# Patient Record
Sex: Male | Born: 1935 | Race: White | Hispanic: No | Marital: Married | Smoking: Never smoker
Health system: Southern US, Community
[De-identification: ages and names within clinical notes are randomized; demographics above are authoritative.]

## PROBLEM LIST (undated history)

## (undated) DIAGNOSIS — B029 Zoster without complications: Secondary | ICD-10-CM

## (undated) DIAGNOSIS — C61 Malignant neoplasm of prostate: Secondary | ICD-10-CM

## (undated) DIAGNOSIS — C801 Malignant (primary) neoplasm, unspecified: Secondary | ICD-10-CM

## (undated) DIAGNOSIS — I499 Cardiac arrhythmia, unspecified: Secondary | ICD-10-CM

## (undated) DIAGNOSIS — E785 Hyperlipidemia, unspecified: Secondary | ICD-10-CM

## (undated) HISTORY — DX: Malignant neoplasm of prostate: C61

## (undated) HISTORY — PX: APPENDECTOMY: SHX54

## (undated) HISTORY — DX: Cardiac arrhythmia, unspecified: I49.9

## (undated) HISTORY — DX: Hyperlipidemia, unspecified: E78.5

## (undated) HISTORY — DX: Zoster without complications: B02.9

---

## 1999-09-19 ENCOUNTER — Encounter: Payer: Self-pay | Admitting: Emergency Medicine

## 1999-09-20 ENCOUNTER — Encounter: Payer: Self-pay | Admitting: Internal Medicine

## 1999-09-20 ENCOUNTER — Inpatient Hospital Stay (HOSPITAL_COMMUNITY): Admission: EM | Admit: 1999-09-20 | Discharge: 1999-09-25 | Payer: Self-pay | Admitting: Emergency Medicine

## 1999-09-21 ENCOUNTER — Encounter: Payer: Self-pay | Admitting: Internal Medicine

## 1999-09-22 ENCOUNTER — Encounter: Payer: Self-pay | Admitting: Internal Medicine

## 1999-09-23 ENCOUNTER — Encounter: Payer: Self-pay | Admitting: Internal Medicine

## 1999-09-24 ENCOUNTER — Encounter: Payer: Self-pay | Admitting: Internal Medicine

## 1999-09-29 ENCOUNTER — Encounter: Admission: RE | Admit: 1999-09-29 | Discharge: 1999-09-29 | Payer: Self-pay | Admitting: Family Medicine

## 1999-10-04 ENCOUNTER — Encounter: Payer: Self-pay | Admitting: Sports Medicine

## 1999-10-04 ENCOUNTER — Encounter: Admission: RE | Admit: 1999-10-04 | Discharge: 1999-10-04 | Payer: Self-pay | Admitting: Sports Medicine

## 1999-10-25 ENCOUNTER — Encounter: Admission: RE | Admit: 1999-10-25 | Discharge: 1999-10-25 | Payer: Self-pay | Admitting: Sports Medicine

## 1999-12-07 ENCOUNTER — Encounter: Admission: RE | Admit: 1999-12-07 | Discharge: 1999-12-07 | Payer: Self-pay | Admitting: Sports Medicine

## 1999-12-08 ENCOUNTER — Encounter: Payer: Self-pay | Admitting: Sports Medicine

## 1999-12-08 ENCOUNTER — Encounter: Admission: RE | Admit: 1999-12-08 | Discharge: 1999-12-08 | Payer: Self-pay | Admitting: Sports Medicine

## 1999-12-10 ENCOUNTER — Encounter: Admission: RE | Admit: 1999-12-10 | Discharge: 1999-12-10 | Payer: Self-pay | Admitting: Family Medicine

## 1999-12-13 ENCOUNTER — Encounter: Admission: RE | Admit: 1999-12-13 | Discharge: 1999-12-13 | Payer: Self-pay | Admitting: Sports Medicine

## 1999-12-13 ENCOUNTER — Encounter: Payer: Self-pay | Admitting: Sports Medicine

## 1999-12-16 ENCOUNTER — Encounter: Admission: RE | Admit: 1999-12-16 | Discharge: 1999-12-16 | Payer: Self-pay | Admitting: Family Medicine

## 1999-12-27 ENCOUNTER — Encounter: Admission: RE | Admit: 1999-12-27 | Discharge: 1999-12-27 | Payer: Self-pay | Admitting: Family Medicine

## 2001-01-17 ENCOUNTER — Encounter: Admission: RE | Admit: 2001-01-17 | Discharge: 2001-01-17 | Payer: Self-pay | Admitting: Family Medicine

## 2001-01-17 ENCOUNTER — Ambulatory Visit (HOSPITAL_COMMUNITY): Admission: RE | Admit: 2001-01-17 | Discharge: 2001-01-17 | Payer: Self-pay | Admitting: Family Medicine

## 2001-02-13 ENCOUNTER — Encounter: Admission: RE | Admit: 2001-02-13 | Discharge: 2001-02-13 | Payer: Self-pay | Admitting: Family Medicine

## 2001-09-05 ENCOUNTER — Encounter: Admission: RE | Admit: 2001-09-05 | Discharge: 2001-09-05 | Payer: Self-pay | Admitting: Family Medicine

## 2001-10-12 ENCOUNTER — Encounter: Admission: RE | Admit: 2001-10-12 | Discharge: 2001-10-12 | Payer: Self-pay | Admitting: Family Medicine

## 2001-10-22 ENCOUNTER — Encounter: Admission: RE | Admit: 2001-10-22 | Discharge: 2001-10-22 | Payer: Self-pay | Admitting: Family Medicine

## 2002-02-05 ENCOUNTER — Encounter: Admission: RE | Admit: 2002-02-05 | Discharge: 2002-02-05 | Payer: Self-pay | Admitting: Family Medicine

## 2002-11-07 DIAGNOSIS — K5792 Diverticulitis of intestine, part unspecified, without perforation or abscess without bleeding: Secondary | ICD-10-CM

## 2002-11-07 HISTORY — DX: Diverticulitis of intestine, part unspecified, without perforation or abscess without bleeding: K57.92

## 2003-03-17 ENCOUNTER — Ambulatory Visit (HOSPITAL_COMMUNITY): Admission: RE | Admit: 2003-03-17 | Discharge: 2003-03-17 | Payer: Self-pay | Admitting: Urology

## 2003-03-17 ENCOUNTER — Encounter: Payer: Self-pay | Admitting: Urology

## 2003-04-02 ENCOUNTER — Ambulatory Visit: Admission: RE | Admit: 2003-04-02 | Discharge: 2003-05-21 | Payer: Self-pay | Admitting: Radiation Oncology

## 2003-06-19 ENCOUNTER — Encounter: Admission: RE | Admit: 2003-06-19 | Discharge: 2003-06-19 | Payer: Self-pay | Admitting: Internal Medicine

## 2003-06-19 ENCOUNTER — Encounter: Payer: Self-pay | Admitting: Internal Medicine

## 2003-07-25 ENCOUNTER — Ambulatory Visit: Admission: RE | Admit: 2003-07-25 | Discharge: 2003-10-23 | Payer: Self-pay | Admitting: Radiation Oncology

## 2003-11-17 ENCOUNTER — Ambulatory Visit: Admission: RE | Admit: 2003-11-17 | Discharge: 2004-01-12 | Payer: Self-pay | Admitting: Radiation Oncology

## 2011-04-09 ENCOUNTER — Encounter (HOSPITAL_COMMUNITY): Payer: Self-pay | Admitting: Radiology

## 2011-04-09 ENCOUNTER — Emergency Department (HOSPITAL_COMMUNITY): Payer: Medicare Other

## 2011-04-09 ENCOUNTER — Observation Stay (HOSPITAL_COMMUNITY)
Admission: EM | Admit: 2011-04-09 | Discharge: 2011-04-11 | DRG: 596 | Disposition: A | Discharge status: Home / Self Care | Payer: Medicare Other | Attending: Internal Medicine | Admitting: Internal Medicine

## 2011-04-09 DIAGNOSIS — N400 Enlarged prostate without lower urinary tract symptoms: Secondary | ICD-10-CM | POA: Insufficient documentation

## 2011-04-09 DIAGNOSIS — R0989 Other specified symptoms and signs involving the circulatory and respiratory systems: Secondary | ICD-10-CM | POA: Insufficient documentation

## 2011-04-09 DIAGNOSIS — I4891 Unspecified atrial fibrillation: Secondary | ICD-10-CM | POA: Insufficient documentation

## 2011-04-09 DIAGNOSIS — Z23 Encounter for immunization: Secondary | ICD-10-CM | POA: Insufficient documentation

## 2011-04-09 DIAGNOSIS — R0609 Other forms of dyspnea: Secondary | ICD-10-CM | POA: Insufficient documentation

## 2011-04-09 DIAGNOSIS — B029 Zoster without complications: Principal | ICD-10-CM | POA: Insufficient documentation

## 2011-04-09 DIAGNOSIS — R079 Chest pain, unspecified: Secondary | ICD-10-CM | POA: Insufficient documentation

## 2011-04-09 DIAGNOSIS — I10 Essential (primary) hypertension: Secondary | ICD-10-CM | POA: Insufficient documentation

## 2011-04-09 DIAGNOSIS — J9819 Other pulmonary collapse: Secondary | ICD-10-CM | POA: Insufficient documentation

## 2011-04-09 DIAGNOSIS — E785 Hyperlipidemia, unspecified: Secondary | ICD-10-CM | POA: Insufficient documentation

## 2011-04-09 HISTORY — DX: Malignant (primary) neoplasm, unspecified: C80.1

## 2011-04-09 LAB — CK TOTAL AND CKMB (NOT AT ARMC)
CK, MB: 2.2 ng/mL (ref 0.3–4.0)
Relative Index: INVALID (ref 0.0–2.5)
Total CK: 59 U/L (ref 7–232)

## 2011-04-09 LAB — COMPREHENSIVE METABOLIC PANEL
ALT: 21 U/L (ref 0–53)
AST: 21 U/L (ref 0–37)
Alkaline Phosphatase: 57 U/L (ref 39–117)
CO2: 24 mEq/L (ref 19–32)
Chloride: 101 mEq/L (ref 96–112)
GFR calc Af Amer: >60 mL/min (ref >60)
GFR calc non Af Amer: >60 mL/min (ref >60)
Glucose, Bld: 117 mg/dL — ABNORMAL HIGH (ref 70–99)
Potassium: 4.1 mEq/L (ref 3.5–5.1)
Sodium: 137 mEq/L (ref 135–145)

## 2011-04-09 LAB — PRO B NATRIURETIC PEPTIDE: Pro B Natriuretic peptide (BNP): 306.2 pg/mL (ref 0–450)

## 2011-04-09 LAB — DIFFERENTIAL
Basophils Absolute: 0 10*3/uL (ref 0.0–0.1)
Basophils Relative: 0 % (ref 0–1)
Eosinophils Absolute: 0.1 10*3/uL (ref 0.0–0.7)
Eosinophils Relative: 2 % (ref 0–5)
Lymphocytes Relative: 7 % — ABNORMAL LOW (ref 12–46)
Lymphs Abs: 0.4 K/uL — ABNORMAL LOW (ref 0.7–4.0)
Monocytes Absolute: 0.8 10*3/uL (ref 0.1–1.0)
Monocytes Relative: 13 % — ABNORMAL HIGH (ref 3–12)
Neutro Abs: 4.7 K/uL (ref 1.7–7.7)
Neutrophils Relative %: 79 % — ABNORMAL HIGH (ref 43–77)

## 2011-04-09 LAB — CBC
HCT: 44.6 % (ref 39.0–52.0)
Hemoglobin: 16 g/dL (ref 13.0–17.0)
MCH: 33.4 pg (ref 26.0–34.0)
MCHC: 35.9 g/dL (ref 30.0–36.0)
MCV: 93.1 fL (ref 78.0–100.0)
Platelets: 164 10*3/uL (ref 150–400)
RBC: 4.79 MIL/uL (ref 4.22–5.81)
RDW: 12.7 % (ref 11.5–15.5)
WBC: 6 K/uL (ref 4.0–10.5)

## 2011-04-09 LAB — PROTIME-INR
INR: 1.03 (ref 0.00–1.49)
Prothrombin Time: 13.7 s (ref 11.6–15.2)

## 2011-04-09 LAB — COMPREHENSIVE METABOLIC PANEL WITH GFR
Albumin: 3.5 g/dL (ref 3.5–5.2)
BUN: 12 mg/dL (ref 6–23)
Calcium: 9.2 mg/dL (ref 8.4–10.5)
Creatinine, Ser: 1.04 mg/dL (ref 0.4–1.5)
Total Bilirubin: 0.6 mg/dL (ref 0.3–1.2)
Total Protein: 6.3 g/dL (ref 6.0–8.3)

## 2011-04-09 LAB — APTT: aPTT: 26 seconds (ref 24–37)

## 2011-04-09 LAB — D-DIMER, QUANTITATIVE: D-Dimer, Quant: 0.49 ug/mL-FEU — ABNORMAL HIGH (ref 0.00–0.48)

## 2011-04-09 LAB — TROPONIN I: Troponin I: <0.3 ng/mL (ref <0.30)

## 2011-04-09 LAB — LIPASE, BLOOD: Lipase: 24 U/L (ref 11–59)

## 2011-04-09 MED ORDER — IOHEXOL 300 MG/ML  SOLN
60.0000 mL | Freq: Once | INTRAMUSCULAR | Status: AC | PRN
  Administered 2011-04-09: 60 mL via INTRAVENOUS

## 2011-04-10 LAB — BASIC METABOLIC PANEL
CO2: 23 mEq/L (ref 19–32)
Calcium: 8.8 mg/dL (ref 8.4–10.5)
Creatinine, Ser: 1.05 mg/dL (ref 0.4–1.5)
GFR calc Af Amer: >60 mL/min (ref >60)
GFR calc non Af Amer: >60 mL/min (ref >60)
Sodium: 136 mEq/L (ref 135–145)

## 2011-04-10 LAB — LIPID PANEL
Cholesterol: 169 mg/dL (ref 0–200)
LDL Cholesterol: 85 mg/dL (ref 0–99)
Triglycerides: 98 mg/dL (ref <150)

## 2011-04-10 LAB — CBC
Hemoglobin: 15.9 g/dL (ref 13.0–17.0)
MCH: 33.1 pg (ref 26.0–34.0)
MCHC: 35.7 g/dL (ref 30.0–36.0)
Platelets: 141 10*3/uL — ABNORMAL LOW (ref 150–400)
RBC: 4.8 MIL/uL (ref 4.22–5.81)

## 2011-04-10 LAB — DIFFERENTIAL
Basophils Absolute: 0 10*3/uL (ref 0.0–0.1)
Basophils Relative: 1 % (ref 0–1)
Eosinophils Absolute: 0 10*3/uL (ref 0.0–0.7)
Monocytes Absolute: 0.6 10*3/uL (ref 0.1–1.0)
Monocytes Relative: 10 % (ref 3–12)
Neutro Abs: 5 10*3/uL (ref 1.7–7.7)
Neutrophils Relative %: 80 % — ABNORMAL HIGH (ref 43–77)

## 2011-04-10 LAB — CARDIAC PANEL(CRET KIN+CKTOT+MB+TROPI)
Relative Index: INVALID (ref 0.0–2.5)
Total CK: 46 U/L (ref 7–232)
Troponin I: <0.3 ng/mL (ref <0.30)

## 2011-04-11 LAB — BASIC METABOLIC PANEL
BUN: 15 mg/dL (ref 6–23)
CO2: 26 mEq/L (ref 19–32)
Chloride: 96 mEq/L (ref 96–112)
Creatinine, Ser: 1.09 mg/dL (ref 0.4–1.5)
Glucose, Bld: 136 mg/dL — ABNORMAL HIGH (ref 70–99)
Potassium: 4 mEq/L (ref 3.5–5.1)

## 2011-04-11 LAB — CBC
HCT: 44.3 % (ref 39.0–52.0)
Hemoglobin: 15.8 g/dL (ref 13.0–17.0)
MCH: 33.3 pg (ref 26.0–34.0)
MCV: 93.5 fL (ref 78.0–100.0)
Platelets: 125 10*3/uL — ABNORMAL LOW (ref 150–400)
RBC: 4.74 MIL/uL (ref 4.22–5.81)
WBC: 7.1 10*3/uL (ref 4.0–10.5)

## 2011-04-19 NOTE — H&P (Signed)
  NAME:  Juan [name], Juan [name], MD       DATE OF BIRTH:  27-Sep-1926  DATE OF ADMISSION:  04/09/2011 DATE OF DISCHARGE:                             HISTORY & PHYSICAL   ADDENDUM: Dr. Gala Romney came over and he felt that the patient should be not seen by Cardiology at this point and the patient's cardiac enzymes should be cycled and a Cardiology should be called if the cardiac enzymes turn positive and I agreed that the chest pain is most likely secondary to shingles.  Cardiology will not be planning on seeing the patient currently at this time.     Rock Nephew, MD     NH/MEDQ  D:  04/09/2011  T:  04/09/2011  Job:  [id number]  Electronically Signed by Rock Nephew MD on 04/19/2011 12:37:43 PM

## 2011-04-19 NOTE — Discharge Summary (Signed)
Juan [name], KLING NO.:  [id number]  MEDICAL RECORD NO.:  [id number]  LOCATION:  3702                         FACILITY:  [facility]  PHYSICIAN:  Deirdre Peer. [name], M.D. DATE OF BIRTH:  1925/12/06  DATE OF ADMISSION:  04/09/2011 DATE OF DISCHARGE:  04/11/2011                              DISCHARGE SUMMARY   DISCHARGE DIAGNOSES: 1. Chest pain secondary to shingles.  The patient will complete     another 4 days of Valtrex 1 g t.i.d. 2. Paroxysmal atrial fibrillation.  Incidental finding:  Cardiac     enzymes negative for ischemia.  A 2-D echo pending at the time of     discharge, the patient discharged on aspirin 325 mg daily.  His     CHADS score was 1 because of age. 3. Hypercholesterolemia. 4. Benign prostatic hypertrophy.  DISCHARGE MEDICATIONS: 1. Aspirin 325 mg daily, 2. Vicodin q.4 h p.r.n. 3. Metoprolol 50 mg b.i.d. 4. Valacyclovir 1 g t.i.d. 5. Fish oil OTC daily. 6. Pravastatin 40 mg daily.  DISPOSITION:  The patient discharged to home in stable condition.  CONSULT:  Seen in consultation by Cardiology.  STUDIES:  Cardiac enzymes negative for ischemia.  Basic metabolic panel, mild hyponatremia, otherwise unremarkable.  Total cholesterol 169, LDL 85, HDL 64.  CBC unremarkable.  BNP of 306, D-dimer 0.49.  CT of the chest, no PE, streaky opacities in both lower lobe, questionable possible atelectasis.  Pneumonia could not be excluded.  HISTORY OF PRESENT ILLNESS:  A pleasant 77 year old male presented to the hospital with complaint of chest pain.  In the ED, he was evaluated. The patient had elevated D-dimer.  CT of his chest was obtained, negative for PE.  The patient's exam revealed left chest wall zoster. Exam also revealed atrial fibrillation in the ED.  The patient was asymptomatic in regards to that.  There was no previous history of atrial fibrillation.  Admission was deemed necessary for further evaluation and treatment.  Please see  dictated H and P for further details.  Past medical history, medications, social history, past surgical history, allergies, family history per admission H and P.  HOSPITAL COURSE:  The patient was admitted to a telemetry floor bed for evaluation and treatment of chest pain.  It was felt his chest pain was secondary to his zoster.  It was felt to be an incidental finding of paroxysmal atrial fibrillation.  The patient was asymptomatic in regards to this.  He was seen in consultation by Cardiology.  A 2-D echo was obtained, in the interim he was kept on Lovenox.  Ultimately, it was decided to discharge the patient home on aspirin 325 mg daily with outpatient followup.  Echocardiogram report to determine if any additional meds such as Coumadin would be indicated.  Rate is well controlled.  It was discharged on beta-blocker and tolerating well.  In regards to the patient's shingles, he will continue Valtrex as discussed above.  The patient was discharged home in stable condition.     Deirdre Peer. Margy Sumler, M.D.     RDP/MEDQ  D:  04/12/2011  T:  04/13/2011  Job:  [id number]  Electronically Signed by Windy Fast Jenniah Bhavsar M.D. on 04/19/2011 08:31:43 AM

## 2011-04-19 NOTE — H&P (Signed)
Juan [name], [name]             ACCOUNT NO.:  [id number]  MEDICAL RECORD NO.:  [id number]           PATIENT TYPE:  O  LOCATION:  3707                         FACILITY:  [facility]  PHYSICIAN:  Juan Nephew, MD       DATE OF BIRTH:  04-08-1926  DATE OF ADMISSION:  04/09/2011 DATE OF DISCHARGE:                             HISTORY & PHYSICAL   PRIMARY CARE PHYSICIAN:  Juan [name]. Juan Settle, MD at [facility] [place].  CHIEF COMPLAINT:  Chest discomfort.  HISTORY OF PRESENT ILLNESS:  This is an 77 year old male with a past medical history of hyperlipidemia, prostatic hypertrophy.  The patient reports that he first had left-sided chest discomfort last Tuesday on Apr 05, 2011.  The patient reports then he played golf for 2 days, chest pain came on again off again, then Friday night and Saturday morning. The patient had continuous left-sided chest pain.  The patient denied a pleuritic component to the chest pain.  The patient denied any shortness of breath.  He reported that he had twinge of nausea.  He has not been coughing.  He has not had any fevers or chills.  The patient came to the hospital.  In the hospital, the patient's cardiac enzyme was negative. The patient's EKG had no specific changes.  The patient had a chest x- ray, which showed cardiomegaly.  The patient had a CT angiogram of chest that was negative for PE.  The patient had some streaks opacities, possible atelectasis.  The patient also was noted to have cardiomegaly and coronary artery calcifications, the CT angiogram of chest.  Also, the patient has noted that left-sided rash where the chest pain has been happening.  PAST MEDICAL HISTORY:  Hyperlipidemia, history of prostatic hypertrophy.  SURGICAL HISTORY:  History of prostate resection.  FAMILY HISTORY:  No family history of coronary artery disease.  SOCIAL HISTORY:  The patient is a nonsmoker.  The patient drinks a small amount almost everyday.  He does not smoke.   He does not use any illicit drugs and he does not have any problems with his activities of daily living and he lives at home.  ALLERGIES:  He has no known drug allergies.  HOME MEDICATIONS:  He takes a baby aspirin daily and he takes pravastatin, unknown dose.  REVIEW OF SYSTEMS:  He denies any headaches or any blurry vision, chest pain, shortness of breath per HPI.  He does report left-sided rash that is band-like along the left chest that is red and tender.  He has a little bit of nausea, but no vomiting.  He denies any abdominal pain. He denies any constipation or any diarrhea.  He denies any burning on urination.  He denies any pain in his legs.  PHYSICAL EXAMINATION:  VITAL SIGNS:  Are as follows; temperature was 98.7, blood pressure 125/84, pulse rate is 72, respiratory rate is 16, there is 94% saturation room air. HEAD, EYES, EARS, NOSE AND THROAT:  Normocephalic, atraumatic.  Pupils equally round, reactive to light. CARDIOVASCULAR:  S1, S2, regular rate and rhythm.  No murmurs.  No rubs. LUNGS:  Clear to auscultation bilaterally.  No  wheezes.  No rhonchi. The patient has evidence of a rash probably about 5 cm wide, 5 cm vertical to horizontal and the patient comes across from the left chest to the left back. ABDOMEN:  Soft, nontender, nondistended.  Bowel sounds are positive.  No guarding.  No rebound tenderness. EXTREMITIES:  No lower extremity edema is evident. NEUROLOGIC:  The patient is alert, awake, and oriented x3.  The patient's 12-lead EKG shows normal sinus rhythm.  No acute ST or T wave changes.  LABORATORY DATA:  The patient's laboratory studies are as follows; the patient's WBC count is 6.0, hemoglobin is 16.0, hematocrit 44.6, MCV is 93.6, platelets are 164, neutrophils are 79.  INR is 1.03.  D-dimer is 0.49.  Sodium 137, potassium 4.1, chloride 101, bicarbonate 24, BUN 12, creatinine 1.04, glucose 117, total bili 0.6, alk phos 57, AST 21, ALT 21, total  protein 6.3, albumin 3.5, calcium 9.2, lipase 24, troponin-I point-of-care marker less than 0.30.  Beta natriuretic peptide is normal at 306.2.  RADIOLOGICAL STUDIES: 1. The patient had chest x-ray one-view showed vague opacity at the     left lung base.  Atelectasis or pneumonia.  Consider follow-up CT     angiogram of chest shows no evidence of acute pulmonary embolism. 2. Streaky opacities in both lower lobes.  Possible atelectasis, but     cannot exclude pneumonia.  Consider follow-up chest x-ray. 3. Incidental gallstones. 4. Small hiatal hernia. 5. Cardiomegaly with coronary artery calcifications.  IMPRESSION AND PLAN:  This is an 77 year old male admitted with left- sided chest pain. 1. Left-sided chest pain.  I am very suspicious that this left-sided     chest pain is just plainly related to the shingles that the patient     is having.  Anyway, the patient has risk factors for unstable     angina such as male age greater than 73 years old, history of     hyperlipidemia and the patient also has evidence of coronary artery     calcifications on the CT angiogram of chest.  The patient will be     admitted to a telemetry bed.  The patient will be placed on full     dose aspirin.  The patient will have cardiac enzymes cycled x3.  I     have already consulted [facility] to see the patient and     the patient may benefit an outpatient versus inpatient stress test,     and the patient will need to be ruled out. 2. Shingles.  The patient's shingles probably started around Tuesday,     Apr 05, 2011.  However, we will still treat the patient with     valacyclovir 1000 mg p.o. t.i.d. for 7 days.  The patient will be     placed on contact isolation also for the time being. 3. Hyperlipidemia.  The patient will be continued on pravastatin 40 mg     p.o. daily.  We will check a full lipid profile for the patient. 4. Atelectasis.  The patient had evidence of atelectasis from the      chest x-ray.  I doubt that the patient has pneumonia with no fevers     and no leukocytosis.  I will write an order to the patient to get     incentive spirometry every hour while the patient is awake. 5. For DVT prophylaxis, the patient will be placed on Lovenox 40 mg     subcu q.24 hours. 6.  Code status, I discussed code status with the patient.  The patient     will be a full code.  Again, this patient will be admitted to the     Dr. Renford Dills, Debor[facility] Sprang at [facility].     Juan Nephew, MD     NH/MEDQ  D:  04/09/2011  T:  04/09/2011  Job:  [id number]  cc:   Juan [name]. Polite, M.D.  Electronically Signed by Juan Nephew MD on 04/19/2011 12:37:26 PM

## 2011-05-10 NOTE — Consult Note (Signed)
Juan [name], CHOPLIN             ACCOUNT NO.:  [id number]  MEDICAL RECORD NO.:  [id number]           PATIENT TYPE:  O  LOCATION:  3702                         FACILITY:  [facility]  PHYSICIAN:  Georga Hacking, M.D.DATE OF BIRTH:  [birth year]  DATE OF CONSULTATION:  04/10/2011                                CONSULTATION   REASON FOR CONSULTATION:  Atrial fibrillation, which is paroxysmal.  HISTORY:  The patient is an 77 year old male who has previously been in excellent health except for hyperlipidemia and BPH.  He developed chest pain about 2 days ago and eventually came to the emergency room with a constant left-sided stabbing type chest pain.  He was found to have a rash of shingles and is admitted to the hospital for further evaluation. While in the hospital on telemetry, he was noted to have runs of atrial fibrillation with rapid ventricular response, which were asymptomatic. The patient has no previous history of atrial fibrillation or cardiac arrhythmias.  He denies angina and has no PND, orthopnea, edema, syncope or palpitations.  He was not symptomatic at the time he was in atrial fibrillation.  He has no over-the-counter medication use.  His TSH has been normal.  PAST HISTORY:  Remarkable for BPH and hyperlipidemia.  There is specifically no history of hypertension or diabetes and no history of stroke.  PAST SURGICAL HISTORY:  TURP.  ALLERGIES:  None.  CURRENT MEDICATIONS:  Baby aspirin daily and pravastatin.  FAMILY HISTORY:  No known family history premature coronary disease.  SOCIAL HISTORY:  Drinks 1-2 mixed drinks a day, but does not use excessively.  He is a nonsmoker.  Does not smoke.  He is a retired Medical illustrator for [employer], Neurosurgeon, and farmer.  REVIEW OF SYSTEMS:  He has a left-sided rash of shingles.  He denies dysuria, frequency, urgency.  Denies significant arthritis.  He has very mild nausea.  No significant vomiting.  Other than  as noted above, the remainder review of systems is unremarkable.  PHYSICAL EXAMINATION:  GENERAL:  He is a pleasant male currently in no acute distress, appearing stated age. VITAL SIGNS:  His blood pressure is 145/85, temperature is 100, blood pressure is 125/76 on admission. SKIN:  Warm and dry.  There is a typical rash of shingles on the left thoracic area. ENT:  EOMI.  PERRLA.  CNS clear.  Fundi not examined.  Pharynx is negative. NECK:  Supple.  No masses, JVD, thyromegaly, or bruits. LUNGS:  Clear. CARDIAC:  Normal S1 and S2.  No S3. ABDOMEN:  Soft and nontender.  Femoral and distal pulses are present are 2+.  There is no edema noted.  DIAGNOSTIC STUDIES:  EKG shows atrial fibrillation with rapid ventricular response.  This morning chest x-ray showed streaky atelectasis.  CT scan showed streaky opacities in the both lower lobes.  LABORATORY DATA:  Negative cardiac enzymes, normal TSH, normal chemistry and CBC.  IMPRESSION: 1. Paroxysmal atrial fibrillation asymptomatic.  His [place] score is     calculated at 1, although he does have mildly elevated blood     pressure on admission.  He has no previous  hypertension.  At the     present time, he can take either aspirin or warfarin for     anticoagulation for atrial fibrillation to reduce stroke risk.  He     is currently in sinus rhythm on telemetry. 2. Shingles with chest pain due to shingles. 3. Hyperlipidemia. 4. Benign prostatic hypertrophy. 5. Elevated blood pressure with previous diagnosis of hypertension.  RECOMMENDATIONS:  At the present time, he is on Lovenox.  He can take either aspirin or warfarin for anticoagulation due to a [place] score of 1 and we will leave final decision to Dr. Katrinka Blazing.  Obtain echocardiogram. He may or may not need to have a stress test also and will defer this to Dr. Katrinka Blazing.  His wife see Dr. Katrinka Blazing and prefer to followup with him.     Georga Hacking, M.D.     WST/MEDQ  D:  04/10/2011   T:  04/10/2011  Job:  [id number]  cc:   Lyn Records, M.D. Deirdre Peer. Polite, M.D.  Electronically Signed by Lacretia Nicks. Donnie Aho M.D. on 05/10/2011 05:02:47 PM

## 2012-11-11 IMAGING — CT CT ANGIO CHEST
2 of 7 series · 18 of 36 positions shown · IV contrast (CONTRAST)
Comparison: Portable chest x-ray of 04/09/2011

CLINICAL DATA: Left chest pain, back pain, elevated D-dimer,
history of prostate carcinoma

CT ANGIOGRAPHY CHEST WITH CONTRAST
TECHNIQUE: Multidetector CT imaging of the chest was performed
using the standard protocol during bolus administration of
intravenous contrast.  Multiplanar CT image reconstructions
including MIPs were obtained to evaluate the vascular anatomy.
Contrast:  60 ml Zmnipaque-2EE

[Series 5: pe thins · axial · 0.76mm/px · z∈[-285,-18]mm · 17 of 301 slices shown]
[im 17/301  lung]
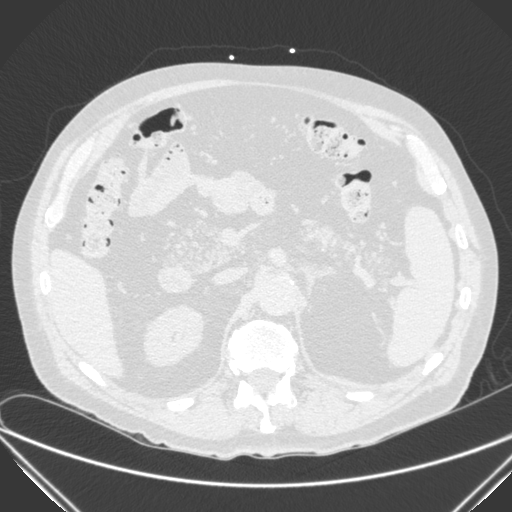
[im 34/301  mediastinal]
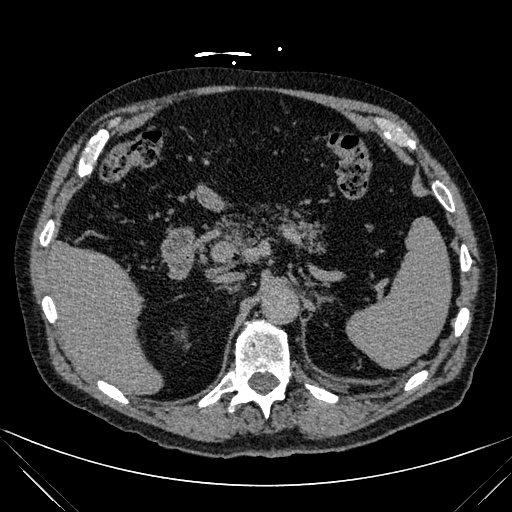
[im 51/301  lung]
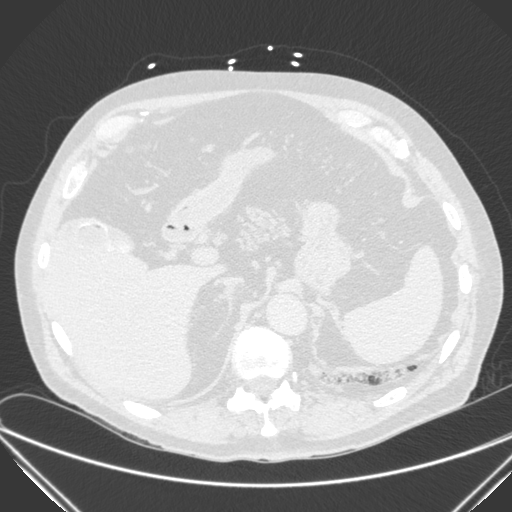
[im 67/301  mediastinal]
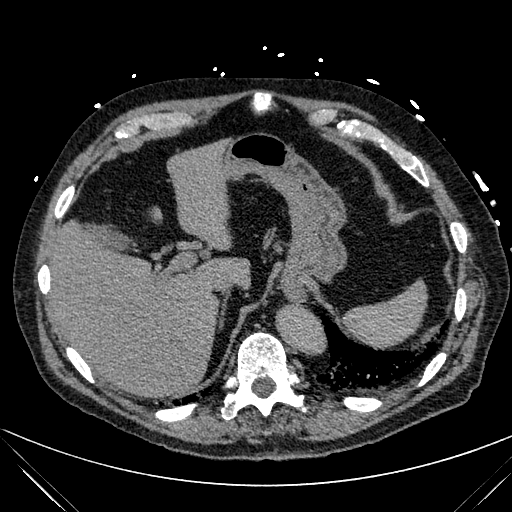
[im 84/301  lung]
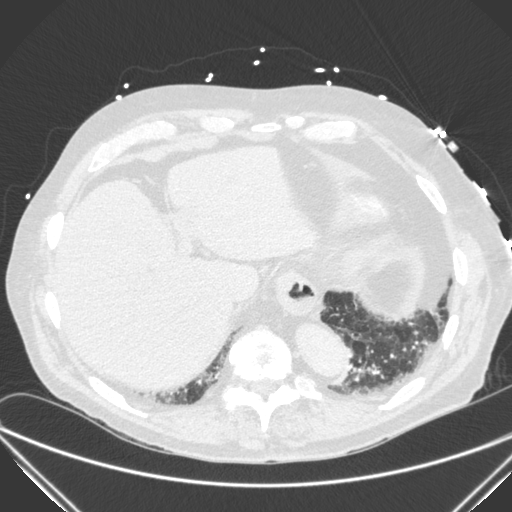
[im 101/301  mediastinal]
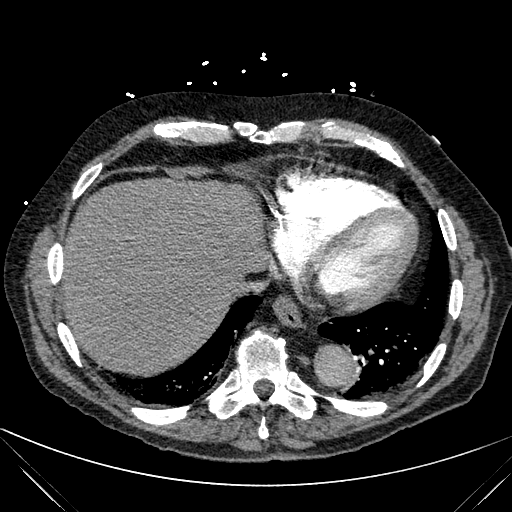
[im 117/301  lung]
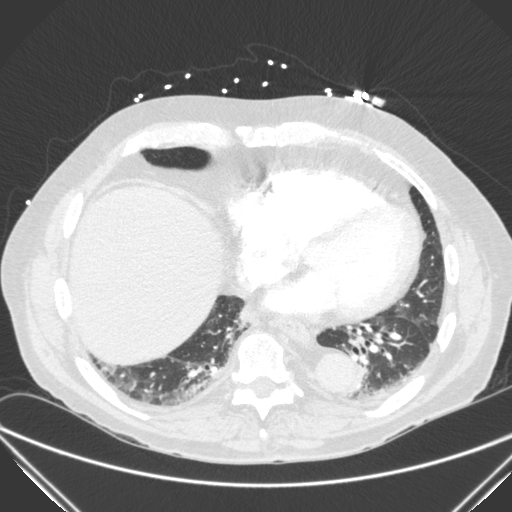
[im 134/301  mediastinal]
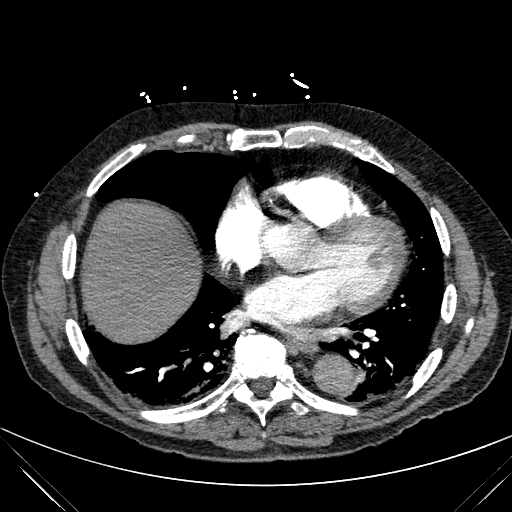
[im 151/301  lung]
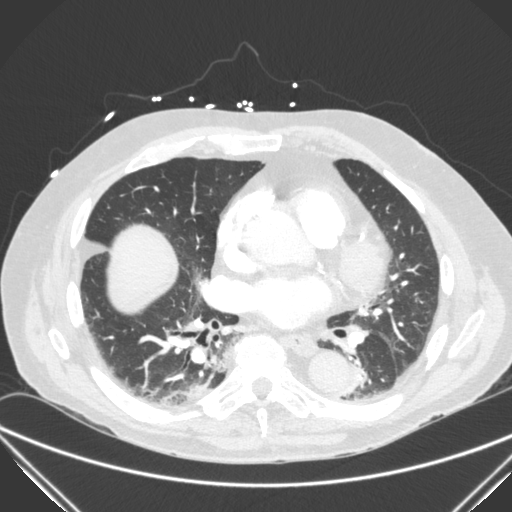
[im 167/301  mediastinal]
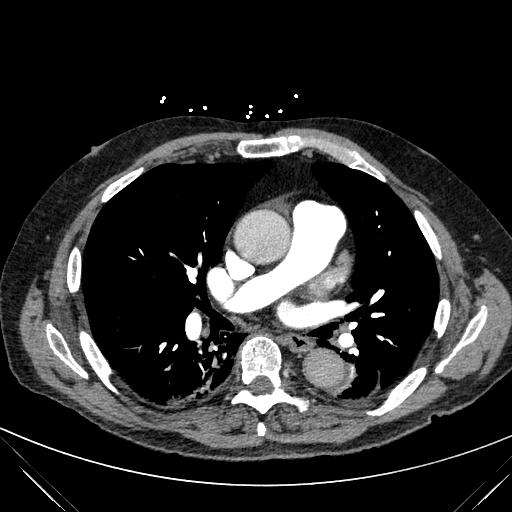
[im 184/301  lung]
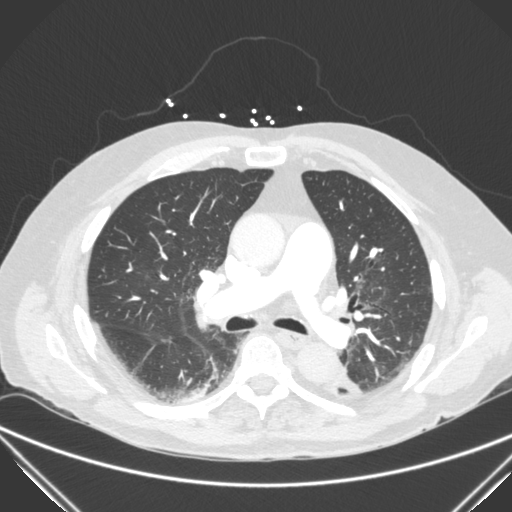
[im 201/301  mediastinal]
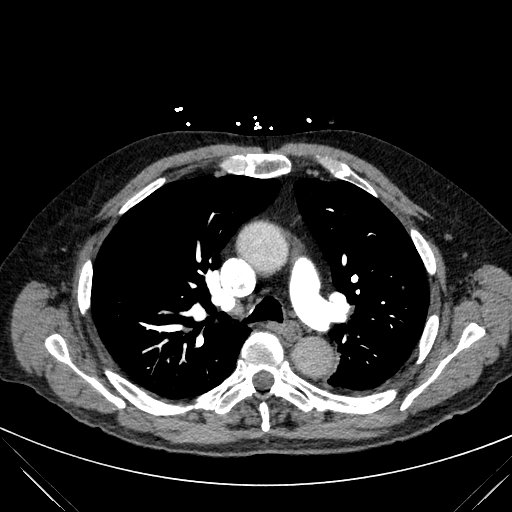
[im 217/301  lung]
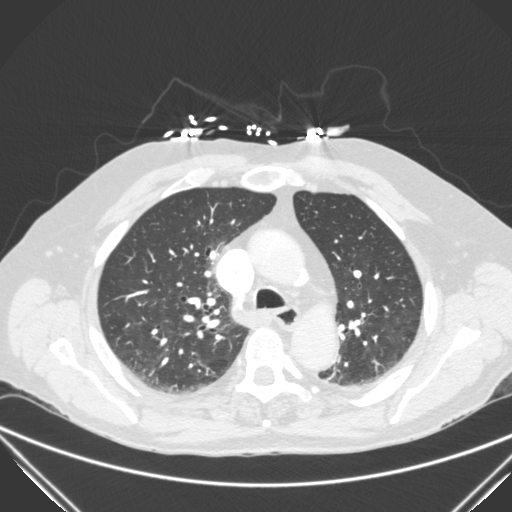
[im 234/301  mediastinal]
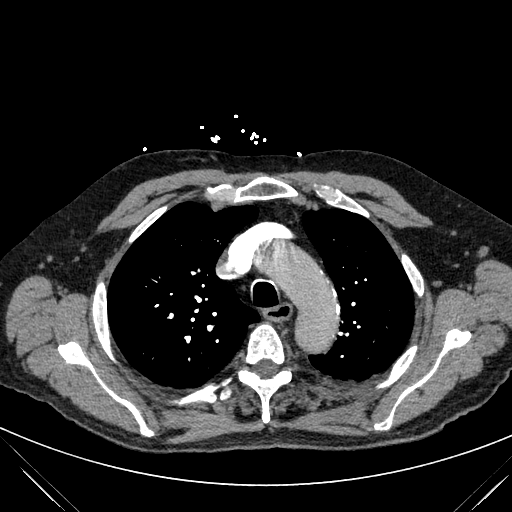
[im 251/301  lung]
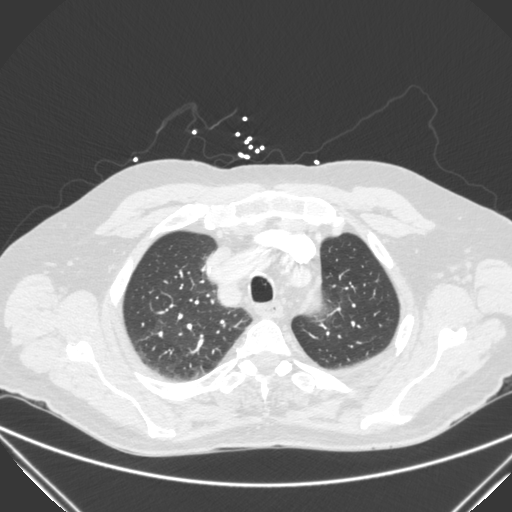
[im 267/301  mediastinal]
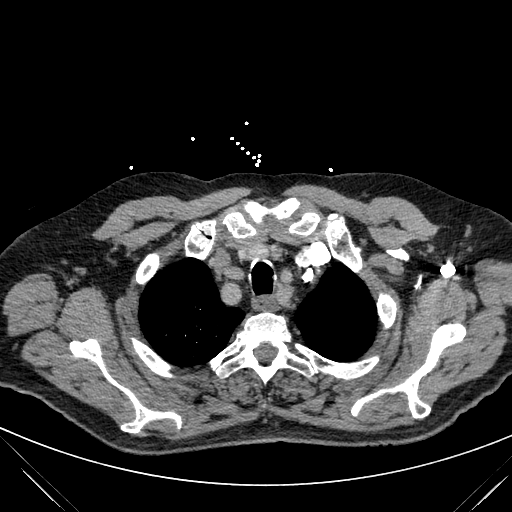
[im 284/301  lung]
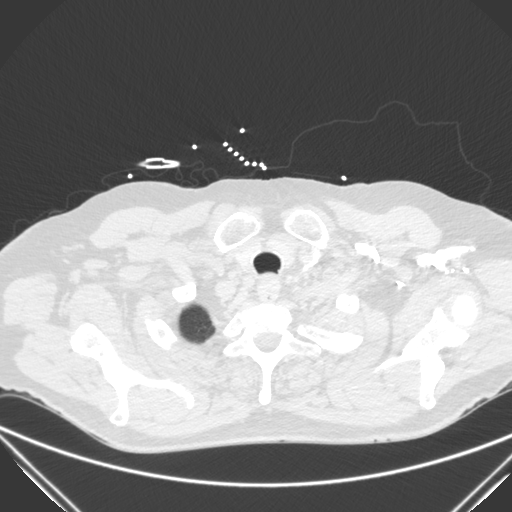

[mpr, coronals, coronal · coronal · 0.76mm/px · 1 of 126 slices shown]
[im 63/126  mediastinal]
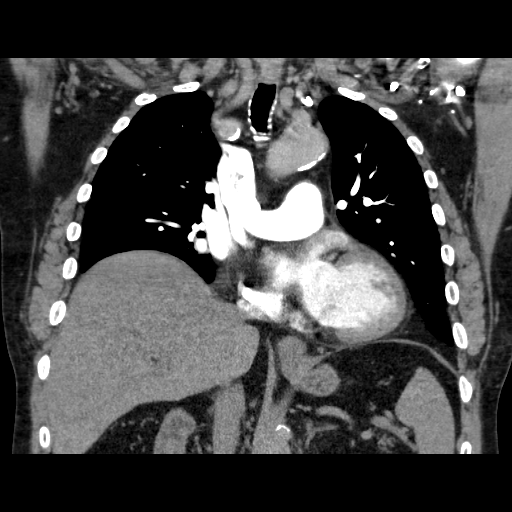

[18 of 36 positions shown; findings below may reference images not displayed]

FINDINGS: The pulmonary arteries are well opacified and there is
no evidence of acute pulmonary embolism.  The thoracic aorta is not
as well opacified and more difficult to assess but no acute
abnormality is seen.  Only a small mediastinal lymph nodes are
present.  Coronary artery calcifications are noted.  There is mild
cardiomegaly present.  Probable small hiatal hernia is noted.
Incidental gallstones are noted within the gallbladder with one
stone measuring up to 24 mm in diameter.  Splenic and hepatic
granulomas are present most likely indicating prior granulomatous
disease.

Streaky opacities are present in both lower lobes posterior
medially most consistent with atelectasis.  Developing pneumonia
cannot be excluded and follow-up chest x-ray may be helpful.  No
definite effusion is seen.  No acute bony abnormality is seen.

Review of the MIP images confirms the above findings.
IMPRESSION: 1.  No evidence of acute pulmonary embolism.
2.  Streaky opacities in both lower lobes.  Possible atelectasis
but cannot exclude pneumonia. Consider follow-up chest x-ray.
3.  Incidental gallstones.
4.  Small hiatal hernia.
5.  Cardiomegaly with coronary artery calcifications.

## 2014-01-15 ENCOUNTER — Ambulatory Visit: Payer: Medicare Other | Admitting: Interventional Cardiology

## 2014-03-05 ENCOUNTER — Ambulatory Visit: Payer: Medicare Other | Admitting: Interventional Cardiology

## 2014-03-21 ENCOUNTER — Encounter: Payer: Self-pay | Admitting: Interventional Cardiology

## 2014-06-14 ENCOUNTER — Encounter: Payer: Self-pay | Admitting: *Deleted

## 2014-06-14 DIAGNOSIS — I499 Cardiac arrhythmia, unspecified: Secondary | ICD-10-CM | POA: Insufficient documentation

## 2014-06-14 DIAGNOSIS — I480 Paroxysmal atrial fibrillation: Secondary | ICD-10-CM | POA: Insufficient documentation

## 2014-06-14 DIAGNOSIS — E785 Hyperlipidemia, unspecified: Secondary | ICD-10-CM | POA: Insufficient documentation

## 2014-06-14 DIAGNOSIS — I4891 Unspecified atrial fibrillation: Secondary | ICD-10-CM

## 2015-12-23 DIAGNOSIS — R946 Abnormal results of thyroid function studies: Secondary | ICD-10-CM | POA: Diagnosis not present

## 2016-04-01 DIAGNOSIS — L821 Other seborrheic keratosis: Secondary | ICD-10-CM | POA: Diagnosis not present

## 2016-04-01 DIAGNOSIS — L814 Other melanin hyperpigmentation: Secondary | ICD-10-CM | POA: Diagnosis not present

## 2016-04-01 DIAGNOSIS — C4441 Basal cell carcinoma of skin of scalp and neck: Secondary | ICD-10-CM | POA: Diagnosis not present

## 2016-04-01 DIAGNOSIS — Z85828 Personal history of other malignant neoplasm of skin: Secondary | ICD-10-CM | POA: Diagnosis not present

## 2016-04-01 DIAGNOSIS — L720 Epidermal cyst: Secondary | ICD-10-CM | POA: Diagnosis not present

## 2016-04-01 DIAGNOSIS — L570 Actinic keratosis: Secondary | ICD-10-CM | POA: Diagnosis not present

## 2016-04-01 DIAGNOSIS — L578 Other skin changes due to chronic exposure to nonionizing radiation: Secondary | ICD-10-CM | POA: Diagnosis not present

## 2016-04-01 DIAGNOSIS — D1801 Hemangioma of skin and subcutaneous tissue: Secondary | ICD-10-CM | POA: Diagnosis not present

## 2016-05-11 DIAGNOSIS — C4442 Squamous cell carcinoma of skin of scalp and neck: Secondary | ICD-10-CM | POA: Diagnosis not present

## 2016-05-11 DIAGNOSIS — I480 Paroxysmal atrial fibrillation: Secondary | ICD-10-CM | POA: Diagnosis not present

## 2016-05-11 DIAGNOSIS — I10 Essential (primary) hypertension: Secondary | ICD-10-CM | POA: Diagnosis not present

## 2016-05-11 DIAGNOSIS — E782 Mixed hyperlipidemia: Secondary | ICD-10-CM | POA: Diagnosis not present

## 2016-05-11 DIAGNOSIS — I519 Heart disease, unspecified: Secondary | ICD-10-CM | POA: Diagnosis not present

## 2016-05-11 DIAGNOSIS — Z85828 Personal history of other malignant neoplasm of skin: Secondary | ICD-10-CM | POA: Diagnosis not present

## 2016-08-17 DIAGNOSIS — Z23 Encounter for immunization: Secondary | ICD-10-CM | POA: Diagnosis not present

## 2016-08-29 DIAGNOSIS — Z85828 Personal history of other malignant neoplasm of skin: Secondary | ICD-10-CM | POA: Diagnosis not present

## 2016-08-29 DIAGNOSIS — L570 Actinic keratosis: Secondary | ICD-10-CM | POA: Diagnosis not present

## 2016-08-29 DIAGNOSIS — L821 Other seborrheic keratosis: Secondary | ICD-10-CM | POA: Diagnosis not present

## 2016-11-28 DIAGNOSIS — Z1389 Encounter for screening for other disorder: Secondary | ICD-10-CM | POA: Diagnosis not present

## 2016-11-28 DIAGNOSIS — I519 Heart disease, unspecified: Secondary | ICD-10-CM | POA: Diagnosis not present

## 2016-11-28 DIAGNOSIS — I10 Essential (primary) hypertension: Secondary | ICD-10-CM | POA: Diagnosis not present

## 2016-11-28 DIAGNOSIS — E7800 Pure hypercholesterolemia, unspecified: Secondary | ICD-10-CM | POA: Diagnosis not present

## 2016-11-28 DIAGNOSIS — I480 Paroxysmal atrial fibrillation: Secondary | ICD-10-CM | POA: Diagnosis not present

## 2016-11-28 DIAGNOSIS — Z0000 Encounter for general adult medical examination without abnormal findings: Secondary | ICD-10-CM | POA: Diagnosis not present

## 2016-11-28 DIAGNOSIS — C61 Malignant neoplasm of prostate: Secondary | ICD-10-CM | POA: Diagnosis not present

## 2017-08-04 DIAGNOSIS — Z23 Encounter for immunization: Secondary | ICD-10-CM | POA: Diagnosis not present

## 2017-11-28 DIAGNOSIS — Z1389 Encounter for screening for other disorder: Secondary | ICD-10-CM | POA: Diagnosis not present

## 2017-11-28 DIAGNOSIS — Z0000 Encounter for general adult medical examination without abnormal findings: Secondary | ICD-10-CM | POA: Diagnosis not present

## 2017-11-28 DIAGNOSIS — I519 Heart disease, unspecified: Secondary | ICD-10-CM | POA: Diagnosis not present

## 2017-11-28 DIAGNOSIS — E7800 Pure hypercholesterolemia, unspecified: Secondary | ICD-10-CM | POA: Diagnosis not present

## 2017-12-27 DIAGNOSIS — R944 Abnormal results of kidney function studies: Secondary | ICD-10-CM | POA: Diagnosis not present

## 2018-12-24 DIAGNOSIS — I10 Essential (primary) hypertension: Secondary | ICD-10-CM | POA: Diagnosis not present

## 2018-12-24 DIAGNOSIS — Z1389 Encounter for screening for other disorder: Secondary | ICD-10-CM | POA: Diagnosis not present

## 2018-12-24 DIAGNOSIS — I480 Paroxysmal atrial fibrillation: Secondary | ICD-10-CM | POA: Diagnosis not present

## 2018-12-24 DIAGNOSIS — E7800 Pure hypercholesterolemia, unspecified: Secondary | ICD-10-CM | POA: Diagnosis not present

## 2018-12-24 DIAGNOSIS — I519 Heart disease, unspecified: Secondary | ICD-10-CM | POA: Diagnosis not present

## 2018-12-24 DIAGNOSIS — Z0000 Encounter for general adult medical examination without abnormal findings: Secondary | ICD-10-CM | POA: Diagnosis not present

## 2021-10-06 DIAGNOSIS — H524 Presbyopia: Secondary | ICD-10-CM | POA: Diagnosis not present

## 2021-10-06 DIAGNOSIS — H52223 Regular astigmatism, bilateral: Secondary | ICD-10-CM | POA: Diagnosis not present

## 2021-10-06 DIAGNOSIS — H40033 Anatomical narrow angle, bilateral: Secondary | ICD-10-CM | POA: Diagnosis not present

## 2021-10-06 DIAGNOSIS — H2513 Age-related nuclear cataract, bilateral: Secondary | ICD-10-CM | POA: Diagnosis not present

## 2022-05-23 DIAGNOSIS — M25562 Pain in left knee: Secondary | ICD-10-CM | POA: Diagnosis not present

## 2022-06-13 DIAGNOSIS — H2513 Age-related nuclear cataract, bilateral: Secondary | ICD-10-CM | POA: Diagnosis not present

## 2022-07-26 DIAGNOSIS — H353131 Nonexudative age-related macular degeneration, bilateral, early dry stage: Secondary | ICD-10-CM | POA: Diagnosis not present

## 2022-07-26 DIAGNOSIS — H18413 Arcus senilis, bilateral: Secondary | ICD-10-CM | POA: Diagnosis not present

## 2022-07-26 DIAGNOSIS — H2513 Age-related nuclear cataract, bilateral: Secondary | ICD-10-CM | POA: Diagnosis not present

## 2022-07-26 DIAGNOSIS — H25013 Cortical age-related cataract, bilateral: Secondary | ICD-10-CM | POA: Diagnosis not present

## 2022-10-07 DIAGNOSIS — H2513 Age-related nuclear cataract, bilateral: Secondary | ICD-10-CM | POA: Diagnosis not present

## 2022-10-07 DIAGNOSIS — H2511 Age-related nuclear cataract, right eye: Secondary | ICD-10-CM | POA: Diagnosis not present

## 2022-10-07 DIAGNOSIS — H2512 Age-related nuclear cataract, left eye: Secondary | ICD-10-CM | POA: Diagnosis not present

## 2022-10-07 DIAGNOSIS — Z961 Presence of intraocular lens: Secondary | ICD-10-CM | POA: Diagnosis not present

## 2022-10-21 DIAGNOSIS — H2512 Age-related nuclear cataract, left eye: Secondary | ICD-10-CM | POA: Diagnosis not present

## 2022-11-07 DIAGNOSIS — I639 Cerebral infarction, unspecified: Secondary | ICD-10-CM

## 2022-11-07 HISTORY — DX: Cerebral infarction, unspecified: I63.9

## 2022-11-18 DIAGNOSIS — H43393 Other vitreous opacities, bilateral: Secondary | ICD-10-CM | POA: Diagnosis not present

## 2023-01-27 ENCOUNTER — Emergency Department (HOSPITAL_COMMUNITY): Payer: Medicare Other

## 2023-01-27 ENCOUNTER — Inpatient Hospital Stay (HOSPITAL_COMMUNITY)
Admission: EM | Admit: 2023-01-27 | Discharge: 2023-02-01 | DRG: 065 | Disposition: A | Discharge status: Rehabilitation Facility | Payer: Medicare Other | Attending: Internal Medicine | Admitting: Internal Medicine

## 2023-01-27 ENCOUNTER — Encounter (HOSPITAL_COMMUNITY): Payer: Self-pay

## 2023-01-27 ENCOUNTER — Other Ambulatory Visit: Payer: Self-pay

## 2023-01-27 DIAGNOSIS — R4182 Altered mental status, unspecified: Secondary | ICD-10-CM | POA: Diagnosis not present

## 2023-01-27 DIAGNOSIS — Z7901 Long term (current) use of anticoagulants: Secondary | ICD-10-CM | POA: Diagnosis not present

## 2023-01-27 DIAGNOSIS — Z7982 Long term (current) use of aspirin: Secondary | ICD-10-CM

## 2023-01-27 DIAGNOSIS — R4701 Aphasia: Secondary | ICD-10-CM | POA: Diagnosis not present

## 2023-01-27 DIAGNOSIS — Z79899 Other long term (current) drug therapy: Secondary | ICD-10-CM | POA: Diagnosis not present

## 2023-01-27 DIAGNOSIS — I69320 Aphasia following cerebral infarction: Secondary | ICD-10-CM | POA: Diagnosis not present

## 2023-01-27 DIAGNOSIS — Z6831 Body mass index (BMI) 31.0-31.9, adult: Secondary | ICD-10-CM | POA: Diagnosis not present

## 2023-01-27 DIAGNOSIS — I700 Atherosclerosis of aorta: Secondary | ICD-10-CM | POA: Diagnosis not present

## 2023-01-27 DIAGNOSIS — R404 Transient alteration of awareness: Secondary | ICD-10-CM | POA: Diagnosis not present

## 2023-01-27 DIAGNOSIS — E785 Hyperlipidemia, unspecified: Secondary | ICD-10-CM | POA: Diagnosis not present

## 2023-01-27 DIAGNOSIS — R2990 Unspecified symptoms and signs involving the nervous system: Secondary | ICD-10-CM | POA: Diagnosis not present

## 2023-01-27 DIAGNOSIS — N1831 Chronic kidney disease, stage 3a: Secondary | ICD-10-CM | POA: Diagnosis not present

## 2023-01-27 DIAGNOSIS — Z8546 Personal history of malignant neoplasm of prostate: Secondary | ICD-10-CM

## 2023-01-27 DIAGNOSIS — I6389 Other cerebral infarction: Secondary | ICD-10-CM | POA: Diagnosis not present

## 2023-01-27 DIAGNOSIS — Z23 Encounter for immunization: Secondary | ICD-10-CM | POA: Diagnosis not present

## 2023-01-27 DIAGNOSIS — I639 Cerebral infarction, unspecified: Secondary | ICD-10-CM | POA: Diagnosis not present

## 2023-01-27 DIAGNOSIS — R000 Tachycardia, unspecified: Secondary | ICD-10-CM | POA: Diagnosis not present

## 2023-01-27 DIAGNOSIS — G9340 Encephalopathy, unspecified: Secondary | ICD-10-CM | POA: Diagnosis not present

## 2023-01-27 DIAGNOSIS — E669 Obesity, unspecified: Secondary | ICD-10-CM | POA: Diagnosis not present

## 2023-01-27 DIAGNOSIS — I480 Paroxysmal atrial fibrillation: Secondary | ICD-10-CM | POA: Diagnosis not present

## 2023-01-27 DIAGNOSIS — I63412 Cerebral infarction due to embolism of left middle cerebral artery: Secondary | ICD-10-CM | POA: Diagnosis not present

## 2023-01-27 DIAGNOSIS — E872 Acidosis, unspecified: Secondary | ICD-10-CM | POA: Diagnosis present

## 2023-01-27 DIAGNOSIS — J310 Chronic rhinitis: Secondary | ICD-10-CM | POA: Diagnosis not present

## 2023-01-27 DIAGNOSIS — R456 Violent behavior: Secondary | ICD-10-CM | POA: Diagnosis not present

## 2023-01-27 DIAGNOSIS — R0981 Nasal congestion: Secondary | ICD-10-CM | POA: Diagnosis not present

## 2023-01-27 DIAGNOSIS — I129 Hypertensive chronic kidney disease with stage 1 through stage 4 chronic kidney disease, or unspecified chronic kidney disease: Secondary | ICD-10-CM | POA: Diagnosis not present

## 2023-01-27 DIAGNOSIS — R410 Disorientation, unspecified: Secondary | ICD-10-CM | POA: Diagnosis not present

## 2023-01-27 DIAGNOSIS — R29712 NIHSS score 12: Secondary | ICD-10-CM | POA: Diagnosis not present

## 2023-01-27 DIAGNOSIS — I6602 Occlusion and stenosis of left middle cerebral artery: Secondary | ICD-10-CM | POA: Diagnosis not present

## 2023-01-27 DIAGNOSIS — J3489 Other specified disorders of nose and nasal sinuses: Secondary | ICD-10-CM | POA: Diagnosis not present

## 2023-01-27 DIAGNOSIS — G319 Degenerative disease of nervous system, unspecified: Secondary | ICD-10-CM | POA: Diagnosis not present

## 2023-01-27 LAB — DIFFERENTIAL
Abs Immature Granulocytes: 0.03 10*3/uL (ref 0.00–0.07)
Basophils Absolute: 0.1 10*3/uL (ref 0.0–0.1)
Basophils Relative: 1 %
Eosinophils Absolute: 0.3 10*3/uL (ref 0.0–0.5)
Eosinophils Relative: 3 %
Immature Granulocytes: 0 %
Lymphocytes Relative: 13 %
Lymphs Abs: 1.2 10*3/uL (ref 0.7–4.0)
Monocytes Absolute: 1.3 10*3/uL — ABNORMAL HIGH (ref 0.1–1.0)
Monocytes Relative: 14 %
Neutro Abs: 6.2 10*3/uL (ref 1.7–7.7)
Neutrophils Relative %: 69 %

## 2023-01-27 LAB — I-STAT CHEM 8, ED
BUN: 20 mg/dL (ref 8–23)
Calcium, Ion: 1.06 mmol/L — ABNORMAL LOW (ref 1.15–1.40)
Chloride: 105 mmol/L (ref 98–111)
Creatinine, Ser: 1.2 mg/dL (ref 0.61–1.24)
Glucose, Bld: 123 mg/dL — ABNORMAL HIGH (ref 70–99)
HCT: 39 % (ref 39.0–52.0)
Hemoglobin: 13.3 g/dL (ref 13.0–17.0)
Potassium: 3.6 mmol/L (ref 3.5–5.1)
Sodium: 137 mmol/L (ref 135–145)
TCO2: 19 mmol/L — ABNORMAL LOW (ref 22–32)

## 2023-01-27 LAB — CBC
HCT: 38.9 % — ABNORMAL LOW (ref 39.0–52.0)
Hemoglobin: 13.4 g/dL (ref 13.0–17.0)
MCH: 36.2 pg — ABNORMAL HIGH (ref 26.0–34.0)
MCHC: 34.4 g/dL (ref 30.0–36.0)
MCV: 105.1 fL — ABNORMAL HIGH (ref 80.0–100.0)
Platelets: 252 10*3/uL (ref 150–400)
RBC: 3.7 MIL/uL — ABNORMAL LOW (ref 4.22–5.81)
RDW: 13 % (ref 11.5–15.5)
WBC: 9.1 10*3/uL (ref 4.0–10.5)
nRBC: 0 % (ref 0.0–0.2)

## 2023-01-27 LAB — BLOOD GAS, VENOUS
Acid-Base Excess: 0 mmol/L (ref 0.0–2.0)
Bicarbonate: 25.4 mmol/L (ref 20.0–28.0)
Drawn by: 66167
O2 Saturation: 59.5 %
Patient temperature: 36.4
pCO2, Ven: 42 mmHg — ABNORMAL LOW (ref 44–60)
pH, Ven: 7.39 (ref 7.25–7.43)
pO2, Ven: 33 mmHg (ref 32–45)

## 2023-01-27 LAB — APTT: aPTT: 30 seconds (ref 24–36)

## 2023-01-27 LAB — COMPREHENSIVE METABOLIC PANEL
ALT: 21 U/L (ref 0–44)
AST: 24 U/L (ref 15–41)
Albumin: 3.4 g/dL — ABNORMAL LOW (ref 3.5–5.0)
Alkaline Phosphatase: 74 U/L (ref 38–126)
Anion gap: 13 (ref 5–15)
BUN: 19 mg/dL (ref 8–23)
CO2: 16 mmol/L — ABNORMAL LOW (ref 22–32)
Calcium: 8.4 mg/dL — ABNORMAL LOW (ref 8.9–10.3)
Chloride: 106 mmol/L (ref 98–111)
Creatinine, Ser: 1.15 mg/dL (ref 0.61–1.24)
GFR, Estimated: 59 mL/min — ABNORMAL LOW (ref >60)
Glucose, Bld: 126 mg/dL — ABNORMAL HIGH (ref 70–99)
Potassium: 3.6 mmol/L (ref 3.5–5.1)
Sodium: 135 mmol/L (ref 135–145)
Total Bilirubin: 0.8 mg/dL (ref 0.3–1.2)
Total Protein: 6.2 g/dL — ABNORMAL LOW (ref 6.5–8.1)

## 2023-01-27 LAB — ETHANOL: Alcohol, Ethyl (B): 12 mg/dL — ABNORMAL HIGH (ref <10)

## 2023-01-27 LAB — PROTIME-INR
INR: 1.1 (ref 0.8–1.2)
Prothrombin Time: 14.5 seconds (ref 11.4–15.2)

## 2023-01-27 LAB — LACTIC ACID, PLASMA: Lactic Acid, Venous: 1.3 mmol/L (ref 0.5–1.9)

## 2023-01-27 LAB — CBG MONITORING, ED: Glucose-Capillary: 124 mg/dL — ABNORMAL HIGH (ref 70–99)

## 2023-01-27 MED ORDER — HALOPERIDOL LACTATE 5 MG/ML IJ SOLN
5.0000 mg | Freq: Once | INTRAMUSCULAR | Status: AC
  Administered 2023-01-27: 5 mg via INTRAVENOUS
  Filled 2023-01-27: qty 1

## 2023-01-27 MED ORDER — ASPIRIN 81 MG PO TBEC
81.0000 mg | DELAYED_RELEASE_TABLET | Freq: Every day | ORAL | Status: DC
  Administered 2023-01-27 – 2023-01-29 (×3): 81 mg via ORAL
  Filled 2023-01-27 (×4): qty 1

## 2023-01-27 MED ORDER — ACETAMINOPHEN 650 MG RE SUPP: 650.0000 mg | RECTAL | Status: DC | PRN

## 2023-01-27 MED ORDER — STROKE: EARLY STAGES OF RECOVERY BOOK
Freq: Once | Status: AC
  Filled 2023-01-27 (×2): qty 1

## 2023-01-27 MED ORDER — SENNOSIDES-DOCUSATE SODIUM 8.6-50 MG PO TABS
1.0000 | ORAL_TABLET | Freq: Every evening | ORAL | Status: DC | PRN
  Administered 2023-01-28: 1 via ORAL
  Filled 2023-01-27: qty 1

## 2023-01-27 MED ORDER — SODIUM CHLORIDE 0.9 % IV SOLN: INTRAVENOUS | Status: DC

## 2023-01-27 MED ORDER — IOHEXOL 350 MG/ML SOLN
150.0000 mL | Freq: Once | INTRAVENOUS | Status: AC | PRN
  Administered 2023-01-27: 150 mL via INTRAVENOUS

## 2023-01-27 MED ORDER — ACETAMINOPHEN 325 MG PO TABS
650.0000 mg | ORAL_TABLET | ORAL | Status: DC | PRN
  Administered 2023-01-28 (×2): 650 mg via ORAL
  Filled 2023-01-27 (×2): qty 2

## 2023-01-27 MED ORDER — ENOXAPARIN SODIUM 40 MG/0.4ML IJ SOSY
40.0000 mg | PREFILLED_SYRINGE | INTRAMUSCULAR | Status: DC
  Administered 2023-01-27 – 2023-01-29 (×3): 40 mg via SUBCUTANEOUS
  Filled 2023-01-27 (×3): qty 0.4

## 2023-01-27 MED ORDER — TENECTEPLASE FOR STROKE: 0.2500 mg/kg | PACK | Freq: Once | INTRAVENOUS | Status: DC

## 2023-01-27 MED ORDER — ACETAMINOPHEN 160 MG/5ML PO SOLN: 650.0000 mg | ORAL | Status: DC | PRN

## 2023-01-27 MED ORDER — LORAZEPAM 2 MG/ML IJ SOLN
1.0000 mg | Freq: Once | INTRAMUSCULAR | Status: AC | PRN
  Administered 2023-01-28: 1 mg via INTRAVENOUS
  Filled 2023-01-27: qty 1

## 2023-01-27 MED ORDER — SODIUM CHLORIDE 0.9 % IV SOLN
2000.0000 mg | Freq: Once | INTRAVENOUS | Status: AC
  Administered 2023-01-27: 2000 mg via INTRAVENOUS
  Filled 2023-01-27: qty 20

## 2023-01-27 NOTE — Code Documentation (Signed)
Stroke Response Nurse Documentation Code Documentation  Juan [name] is a 89 y.o. male arriving to [place]  via [facility] on 01/27/23 with past medical hx of paroxysmal atrial fibrillation, hyperlipidemia, and prostate cancer . On aspirin 81 mg daily. Code stroke was activated by EMS.   Patient from [facility] where he was LKW some unknown time prior to 1615 and now complaining of aphasia, facial droop. Patient arrived to the [facility] at 1615 to meet some friends for drinks when they noticed he wasn't talking right and had facial droop. Patient had two drinks and EMS was called. Patient was combative with EMS and required 5mg  of Versed to calm him down.    Stroke team at the bedside on patient arrival. Labs drawn and patient cleared for CT by Dr. Philip Aspen. Patient to CT with team. NIHSS 12, see documentation for details and code stroke times. Patient with disoriented, not following commands, bilateral leg weakness, Expressive aphasia , and dysarthria  on exam. The following imaging was completed:  CT Head, CTA, and CTP. Patient is not a candidate for IV Thrombolytic due to unknown true LKW. Patient is not a candidate for IR due to no LVO on scan per MD and LKW very unclear. Patient is independent at baseline with walking, dressing, bathing, feeding, driving etc. He was unable to remain still during all head scans so results were limited. Pt's daughter is at the bedside. Neurologist to return to speak with family and patient.  BP 137/87, CBG 124.   Care Plan: Q2x12 NIHSS/vitals, permissive HTN.   Bedside handoff with ED RN Threasa Beards.     Emaad Nanna, Rande Brunt  Stroke Response RN

## 2023-01-27 NOTE — H&P (Signed)
History and Physical    Juan [name] [id number] DOB: [birth year] DOA: 01/27/2023  PCP: Pcp, No   Patient coming from: Home   Chief Complaint: Abnormal speech, confusion, agitation   HPI: Juan [name] is a 89 y.o. male with medical history significant for hyperlipidemia, prostate cancer, and paroxysmal atrial fibrillation not anticoagulated who presents with garbled speech, confusion, and agitation.  Patient was last seen by family on 01/23/2023, drove himself to meet friends for dinner and drinks, and arrived at 4:15 PM.  Friends noted him to have garbled speech and confusion.  EMS was called, found the patient to be combative, and administered 400 mL of IV fluids and 5 mg Versed prior to arrival in the ED.  ED Course: Upon arrival to the ED, patient is found to be afebrile and saturating mid 90s on room air with slightly elevated heart rate and stable blood pressure.  Labs are most notable for serum bicarbonate 16, MCV 105.1, and ethanol 12.  Head CT reveals a ill-defined hypodensity in the left superior cerebellum and right frontal encephalomalacia.  There is significant motion degradation on CTA without definite proximal intracranial LVO.  Patient was evaluated by neurology in the emergency department and hospitalists asked to admit.  Review of Systems:  ROS limited by patient's clinical condition.  Past Medical History:  Diagnosis Date   Arrhythmia    Cancer ([place])    Hyperlipidemia    Prostate cancer ([place])    Shingles outbreak    LEFT CHEST WALL    History reviewed. No pertinent surgical history.  Social History:   reports that he has never smoked. He does not have any smokeless tobacco history on file. He reports that he does not use drugs. No history on file for alcohol use.  No Known Allergies  History reviewed. No pertinent family history.   Prior to Admission medications   Medication Sig Start Date End Date Taking? Authorizing Provider  aspirin 81 MG  tablet Take 81 mg by mouth daily.    [provider]  pravastatin (PRAVACHOL) 40 MG tablet Take 40 mg by mouth daily.    [provider]    Physical Exam: Vitals:   01/27/23 1825 01/27/23 1830 01/27/23 1846 01/27/23 1900  BP: 111/79 (!) 145/96    Pulse: 93 (!) 104  91  Resp: (!) 24 17  17   Temp:      TempSrc:      SpO2: 96% 95%  95%  Weight:   98 kg   Height:   5\' 10"  (1.778 m)     Constitutional: NAD, no pallor or diaphoresis  Eyes: PERTLA, lids and conjunctivae normal ENMT: Mucous membranes are moist. Posterior pharynx clear of any exudate or lesions.   Neck: supple, no masses  Respiratory: no wheezing, no crackles. No accessory muscle use.  Cardiovascular: S1 & S2 heard, regular rate and rhythm. No JVD. Abdomen: No distension, no tenderness, soft. Bowel sounds active.  Musculoskeletal: no clubbing / cyanosis. No joint deformity upper and lower extremities.   Skin: no significant rashes, lesions, ulcers. Warm, dry, well-perfused. Neurologic: CN 2-12 grossly intact. Intermittently garbled speech. Sensation intact. Strength 5/5 in all 4 limbs. Alert but agitated and refusing to answer orientation questions.  Psychiatric: Calm. Cooperative.    Labs and Imaging on Admission: I have personally reviewed following labs and imaging studies  CBC: Recent Labs  Lab 01/27/23 1731 01/27/23 1738  WBC 9.1  --   NEUTROABS 6.2  --   HGB  13.4 13.3  HCT 38.9* 39.0  MCV 105.1*  --   PLT 252  --    Basic Metabolic Panel: Recent Labs  Lab 01/27/23 1731 01/27/23 1738  NA 135 137  K 3.6 3.6  CL 106 105  CO2 16*  --   GLUCOSE 126* 123*  BUN 19 20  CREATININE 1.15 1.20  CALCIUM 8.4*  --    GFR: Estimated Creatinine Clearance: 44.2 mL/min (by C-G formula based on SCr of 1.2 mg/dL). Liver Function Tests: Recent Labs  Lab 01/27/23 1731  AST 24  ALT 21  ALKPHOS 74  BILITOT 0.8  PROT 6.2*  ALBUMIN 3.4*   No results for input(s): "LIPASE", "AMYLASE" in the  last 168 hours. No results for input(s): "AMMONIA" in the last 168 hours. Coagulation Profile: Recent Labs  Lab 01/27/23 1731  INR 1.1   Cardiac Enzymes: No results for input(s): "CKTOTAL", "CKMB", "CKMBINDEX", "TROPONINI" in the last 168 hours. BNP (last 3 results) No results for input(s): "PROBNP" in the last 8760 hours. HbA1C: No results for input(s): "HGBA1C" in the last 72 hours. CBG: Recent Labs  Lab 01/27/23 1730  GLUCAP 124*   Lipid Profile: No results for input(s): "CHOL", "HDL", "LDLCALC", "TRIG", "CHOLHDL", "LDLDIRECT" in the last 72 hours. Thyroid Function Tests: No results for input(s): "TSH", "T4TOTAL", "FREET4", "T3FREE", "THYROIDAB" in the last 72 hours. Anemia Panel: No results for input(s): "VITAMINB12", "FOLATE", "FERRITIN", "TIBC", "IRON", "RETICCTPCT" in the last 72 hours. Urine analysis: No results found for: "COLORURINE", "APPEARANCEUR", "LABSPEC", "PHURINE", "GLUCOSEU", "HGBUR", "BILIRUBINUR", "KETONESUR", "PROTEINUR", "UROBILINOGEN", "NITRITE", "LEUKOCYTESUR" Sepsis Labs: @LABRCNTIP (procalcitonin:4,lacticidven:4) )No results found for this or any previous visit (from the past 240 hour(s)).   Radiological Exams on Admission: CT ANGIO HEAD NECK W WO CM W PERF (CODE STROKE)  Result Date: 01/27/2023 CLINICAL DATA:  Stroke suspected EXAM: CT ANGIOGRAPHY HEAD AND NECK TECHNIQUE: Multidetector CT imaging of the head and neck was performed using the standard protocol during bolus administration of intravenous contrast. Multiplanar CT image reconstructions and MIPs were obtained to evaluate the vascular anatomy. Carotid stenosis measurements (when applicable) are obtained utilizing NASCET criteria, using the distal internal carotid diameter as the denominator. RADIATION DOSE REDUCTION: This exam was performed according to the departmental dose-optimization program which includes automated exposure control, adjustment of the mA and/or kV according to patient size  and/or use of iterative reconstruction technique. CONTRAST:  116mL OMNIPAQUE IOHEXOL 350 MG/ML SOLN COMPARISON:  No prior CTA available, correlation is made with 01/27/2023 CT head FINDINGS: Evaluation is significantly limited by motion artifact. CT HEAD FINDINGS For noncontrast findings, please see same day CT head. CTA NECK FINDINGS Aortic arch: Two-vessel arch with a common origin of the brachiocephalic and left common carotid arteries. Imaged portion shows no evidence of aneurysm or dissection. No significant stenosis of the major arch vessel origins. Aortic atherosclerosis. Right carotid system: No definite stenosis at the bifurcation or in the ICA. Left carotid system: No definite stenosis at the bifurcation or in the ICA. Vertebral arteries: Evaluation of the V1 and majority of the V2 segments is limited by motion. No significant stenosis in the distal V2 or V3 segment. Skeleton: No definite acute osseous abnormality, although evaluation is motion limited. Other neck: No acute finding. Upper chest: No focal pulmonary opacity or pleural effusion. Review of the MIP images confirms the above findings CTA HEAD FINDINGS Anterior circulation: Both internal carotid arteries are patent to the termini, with calcifications but without significant stenosis. A1 segments patent. Normal anterior communicating artery. Patent  A 2 segments. More distal ACA segments are unable to be evaluated due to motion. No M1 stenosis or occlusion. Proximal M2 segments are patent. Evaluation of more distal MCA branches is limited by motion, however there is a suggestion of slightly diminished opacification of the anterior left MCA branches compared to anterior right MCA branches (series 19, image 19). Posterior circulation: Vertebral arteries patent to the vertebrobasilar junction with mild stenosis in the mid to distal V4. Basilar patent to its distal aspect, with mild stenosis in the mid to distal basilar artery. Superior cerebellar  arteries patent proximally. Patent P1 segments. Patent P2 segments. More distal PCA segment evaluation is limited by motion. Venous sinuses: As permitted by contrast timing, patent. Anatomic variants: None significant. Review of the MIP images confirms the above findings IMPRESSION: 1. Significantly motion degraded exam, with no definite proximal intracranial large vessel occlusion. There is a suggestion of slightly diminished opacification of the anterior left MCA branches compared to the anterior right MCA branches. 2. No hemodynamically significant stenosis in the neck. 3. Aortic atherosclerosis. Aortic Atherosclerosis (ICD10-I70.0). These findings were discussed by telephone on 01/27/2023 at 6:20 pm with provider ASHISH ARORA . Electronically Signed   By: Merilyn Baba M.D.   On: 01/27/2023 18:39   CT HEAD CODE STROKE WO CONTRAST  Result Date: 01/27/2023 CLINICAL DATA:  Code stroke.  Altered mental status EXAM: CT HEAD WITHOUT CONTRAST TECHNIQUE: Contiguous axial images were obtained from the base of the skull through the vertex without intravenous contrast. RADIATION DOSE REDUCTION: This exam was performed according to the departmental dose-optimization program which includes automated exposure control, adjustment of the mA and/or kV according to patient size and/or use of iterative reconstruction technique. COMPARISON:  None Available. FINDINGS: Brain: Ill-defined hypodensity in the left superior cerebellum (series 6, image 55), which could indicate an acute or subacute infarct. Encephalomalacia in the right frontal lobe is favored to represent sequela of a more remote infarct (series 4, image 15). No evidence of acute hemorrhage, mass, mass effect, or midline shift. No hydrocephalus or extra-axial collection. Vascular: No hyperdense vessel. Atherosclerotic calcifications in the intracranial carotid and vertebral arteries. Skull: Negative for fracture or focal lesion. Sinuses/Orbits: Mucosal thickening in  the maxillary sinuses, frontal sinuses, and ethmoid air cells. Status post bilateral lens replacements. Other: The mastoid air cells are well aerated. ASPECTS ([place] Stroke Program Early CT Score) - Ganglionic level infarction (caudate, lentiform nuclei, internal capsule, insula, M1-M3 cortex): 7 - Supraganglionic infarction (M4-M6 cortex): 3 Total score (0-10 with 10 being normal): 10 IMPRESSION: 1. Ill-defined hypodensity in the left superior cerebellum, which could indicate acute to subacute infarct. 2. Encephalomalacia in the right frontal lobe favored to represent sequela of a more remote infarct. 3. No hemorrhage. Imaging results were communicated on 01/27/2023 at 5:47 pm to provider Dr. Rory Percy via secure text paging. Electronically Signed   By: Merilyn Baba M.D.   On: 01/27/2023 17:48     Assessment/Plan   1. Garbled speech and confusion  - Presents with garbled speech, confusion, and agitation with unclear LKW   - Significant motion degradation limits imaging in ED  - Appreciate neurology recommendations  - Continue cardiac monitoring and frequent neuro checks, check MRI brain, EEG, A1c, lipids, EKG, UDS, and EtOH level, load with 2 g IV Keppra, continue ASA 81 mg, maintain seizure precautions, consult PT/OT/SLP, use delirium precautions, keep NPO pending swallow screen   2. Paroxysmal atrial fibrillation  - No recent medical care but saw cardiology in 2012  for PAF and was not anticoagulated d/t low CHADS score   - Continue ASA 81 for now    3. Acidosis  - Serum bicarbonate is 16 in ED with AG 13 - Check blood gas and lactate, continue IVF hydration, repeat chem panel in am   4. CKD IIIa  - SCr is 1.15 on admission, appears close to baseline  - Renally-dose medications, monitor     DVT prophylaxis: Lovenox  Code Status: Full  Level of Care: Level of care: Telemetry Medical Family Communication: Daughter at bedside   Disposition Plan:  Patient is from: Home  Anticipated d/c is  to: TBD Anticipated d/c date is: Possibly as early as 01/28/23  Patient currently: Pending workup of CVA vs seizure  Consults called: Neurology  Admission status: Observation     [name], MD [facility]  01/27/2023, 7:25 PM

## 2023-01-27 NOTE — ED Notes (Signed)
This RN waiting for RN from [facility] to call me back.

## 2023-01-27 NOTE — ED Notes (Signed)
ED TO INPATIENT HANDOFF REPORT  ED Nurse Name and Phone #: Percell Locus, RN  S Name/Age/Gender Juan [name] 89 y.o. male Room/Bed: 001C/001C  Code Status   Code Status: Full Code  Home/SNF/Other Home Patient oriented to: self Is this baseline? No   Triage Complete: Triage complete  Chief Complaint Acute encephalopathy [G93.40]  Triage Note PER EMS: pt drove himself to the [facility] today and arrived at 1615. Friends reported when he arrived he was "not acting like himself." EMS arrived to find his speech garbled, word salad and they noticed left sided facial drooping. He was combative and agitated with EMS. They adm 5mg  versed. 400cc NaCl.   LSN unknown. Daughter is at bedside.   BP-122/87, HR-110, O2-97%, CBG-121 18g LAC   Allergies No Known Allergies  Level of Care/Admitting Diagnosis ED Disposition     ED Disposition  Admit   Condition  --   Comment  Hospital Area: [place] [[id number]]  Level of Care: Telemetry Medical [104]  May place patient in observation at [facility] or [place] if equivalent level of care is available:: No  Covid Evaluation: Asymptomatic - no recent exposure (last 10 days) testing not required  Diagnosis: Acute encephalopathy [id number]  Admitting Physician: [name] [id number]  Attending Physician: [name] [id number]          B Medical/Surgery History Past Medical History:  Diagnosis Date   Arrhythmia    Cancer ([place])    Hyperlipidemia    Prostate cancer ([place])    Shingles outbreak    LEFT CHEST WALL   History reviewed. No pertinent surgical history.   A IV Location/Drains/Wounds Patient Lines/Drains/Airways Status     Active Line/Drains/Airways     Name Placement date Placement time Site Days   Peripheral IV 01/27/23 18 G Anterior;Distal;Left;Upper Arm 01/27/23  1751  Arm  less than 1   Peripheral IV 01/27/23 20 G Anterior;Proximal;Right Forearm 01/27/23  1530  Forearm  less than 1             Intake/Output Last 24 hours No intake or output data in the 24 hours ending 01/27/23 1948  Labs/Imaging Results for orders placed or performed during the hospital encounter of 01/27/23 (from the past 48 hour(s))  CBG monitoring, ED     Status: Abnormal   Collection Time: 01/27/23  5:30 PM  Result Value Ref Range   Glucose-Capillary 124 (H) 70 - 99 mg/dL    Comment: Glucose reference range applies only to samples taken after fasting for at least 8 hours.  Protime-INR     Status: None   Collection Time: 01/27/23  5:31 PM  Result Value Ref Range   Prothrombin Time 14.5 11.4 - 15.2 seconds   INR 1.1 0.8 - 1.2    Comment: (NOTE) INR goal varies based on device and disease states. Performed at [facility], [place] [address] [place], [place] [address]   APTT     Status: None   Collection Time: 01/27/23  5:31 PM  Result Value Ref Range   aPTT 30 24 - 36 seconds    Comment: Performed at [facility] [address], [place] [address]  CBC     Status: Abnormal   Collection Time: 01/27/23  5:31 PM  Result Value Ref Range   WBC 9.1 4.0 - 10.5 K/uL   RBC 3.70 (L) 4.22 - 5.81 MIL/uL   Hemoglobin 13.4 13.0 - 17.0 g/dL  HCT 38.9 (L) 39.0 - 52.0 %   MCV 105.1 (H) 80.0 - 100.0 fL   MCH 36.2 (H) 26.0 - 34.0 pg   MCHC 34.4 30.0 - 36.0 g/dL   RDW 13.0 11.5 - 15.5 %   Platelets 252 150 - 400 K/uL   nRBC 0.0 0.0 - 0.2 %    Comment: Performed at [facility] [address] [place], [place] [address]  Differential     Status: Abnormal   Collection Time: 01/27/23  5:31 PM  Result Value Ref Range   Neutrophils Relative % 69 %   Neutro Abs 6.2 1.7 - 7.7 K/uL   Lymphocytes Relative 13 %   Lymphs Abs 1.2 0.7 - 4.0 K/uL   Monocytes Relative 14 %   Monocytes Absolute 1.3 (H) 0.1 - 1.0 K/uL   Eosinophils Relative 3 %   Eosinophils Absolute 0.3 0.0 - 0.5 K/uL   Basophils Relative 1 %   Basophils Absolute 0.1 0.0 - 0.1 K/uL   Immature Granulocytes 0 %    Abs Immature Granulocytes 0.03 0.00 - 0.07 K/uL    Comment: Performed at [facility] [address], [place], [place] [address]  Comprehensive metabolic panel     Status: Abnormal   Collection Time: 01/27/23  5:31 PM  Result Value Ref Range   Sodium 135 135 - 145 mmol/L   Potassium 3.6 3.5 - 5.1 mmol/L   Chloride 106 98 - 111 mmol/L   CO2 16 (L) 22 - 32 mmol/L   Glucose, Bld 126 (H) 70 - 99 mg/dL    Comment: Glucose reference range applies only to samples taken after fasting for at least 8 hours.   BUN 19 8 - 23 mg/dL   Creatinine, Ser 1.15 0.61 - 1.24 mg/dL   Calcium 8.4 (L) 8.9 - 10.3 mg/dL   Total Protein 6.2 (L) 6.5 - 8.1 g/dL   Albumin 3.4 (L) 3.5 - 5.0 g/dL   AST 24 15 - 41 U/L   ALT 21 0 - 44 U/L   Alkaline Phosphatase 74 38 - 126 U/L   Total Bilirubin 0.8 0.3 - 1.2 mg/dL   GFR, Estimated 59 (L) >60 mL/min    Comment: (NOTE) Calculated using the CKD-EPI Creatinine Equation (2021)    Anion gap 13 5 - 15    Comment: Performed at [facility], [place] [address] [place], [place] [address]  Ethanol     Status: Abnormal   Collection Time: 01/27/23  5:31 PM  Result Value Ref Range   Alcohol, Ethyl (B) 12 (H) <10 mg/dL    Comment: (NOTE) Lowest detectable limit for serum alcohol is 10 mg/dL.  For medical purposes only. Performed at [facility], [place] [address] [place], [place] [address]   I-stat chem 8, ED     Status: Abnormal   Collection Time: 01/27/23  5:38 PM  Result Value Ref Range   Sodium 137 135 - 145 mmol/L   Potassium 3.6 3.5 - 5.1 mmol/L   Chloride 105 98 - 111 mmol/L   BUN 20 8 - 23 mg/dL   Creatinine, Ser 1.20 0.61 - 1.24 mg/dL   Glucose, Bld 123 (H) 70 - 99 mg/dL    Comment: Glucose reference range applies only to samples taken after fasting for at least 8 hours.   Calcium, Ion 1.06 (L) 1.15 - 1.40 mmol/L   TCO2 19 (L) 22 - 32 mmol/L   Hemoglobin 13.3 13.0 - 17.0 g/dL  HCT 39.0 39.0 - 52.0 %   CT ANGIO HEAD NECK W WO CM W PERF (CODE  STROKE)  Result Date: 01/27/2023 CLINICAL DATA:  Stroke suspected EXAM: CT ANGIOGRAPHY HEAD AND NECK TECHNIQUE: Multidetector CT imaging of the head and neck was performed using the standard protocol during bolus administration of intravenous contrast. Multiplanar CT image reconstructions and MIPs were obtained to evaluate the vascular anatomy. Carotid stenosis measurements (when applicable) are obtained utilizing NASCET criteria, using the distal internal carotid diameter as the denominator. RADIATION DOSE REDUCTION: This exam was performed according to the departmental dose-optimization program which includes automated exposure control, adjustment of the mA and/or kV according to patient size and/or use of iterative reconstruction technique. CONTRAST:  194mL OMNIPAQUE IOHEXOL 350 MG/ML SOLN COMPARISON:  No prior CTA available, correlation is made with 01/27/2023 CT head FINDINGS: Evaluation is significantly limited by motion artifact. CT HEAD FINDINGS For noncontrast findings, please see same day CT head. CTA NECK FINDINGS Aortic arch: Two-vessel arch with a common origin of the brachiocephalic and left common carotid arteries. Imaged portion shows no evidence of aneurysm or dissection. No significant stenosis of the major arch vessel origins. Aortic atherosclerosis. Right carotid system: No definite stenosis at the bifurcation or in the ICA. Left carotid system: No definite stenosis at the bifurcation or in the ICA. Vertebral arteries: Evaluation of the V1 and majority of the V2 segments is limited by motion. No significant stenosis in the distal V2 or V3 segment. Skeleton: No definite acute osseous abnormality, although evaluation is motion limited. Other neck: No acute finding. Upper chest: No focal pulmonary opacity or pleural effusion. Review of the MIP images confirms the above findings CTA HEAD FINDINGS Anterior circulation: Both internal carotid arteries are patent to the termini, with calcifications but  without significant stenosis. A1 segments patent. Normal anterior communicating artery. Patent A 2 segments. More distal ACA segments are unable to be evaluated due to motion. No M1 stenosis or occlusion. Proximal M2 segments are patent. Evaluation of more distal MCA branches is limited by motion, however there is a suggestion of slightly diminished opacification of the anterior left MCA branches compared to anterior right MCA branches (series 19, image 19). Posterior circulation: Vertebral arteries patent to the vertebrobasilar junction with mild stenosis in the mid to distal V4. Basilar patent to its distal aspect, with mild stenosis in the mid to distal basilar artery. Superior cerebellar arteries patent proximally. Patent P1 segments. Patent P2 segments. More distal PCA segment evaluation is limited by motion. Venous sinuses: As permitted by contrast timing, patent. Anatomic variants: None significant. Review of the MIP images confirms the above findings IMPRESSION: 1. Significantly motion degraded exam, with no definite proximal intracranial large vessel occlusion. There is a suggestion of slightly diminished opacification of the anterior left MCA branches compared to the anterior right MCA branches. 2. No hemodynamically significant stenosis in the neck. 3. Aortic atherosclerosis. Aortic Atherosclerosis (ICD10-I70.0). These findings were discussed by telephone on 01/27/2023 at 6:20 pm with provider ASHISH ARORA . Electronically Signed   By: Merilyn Baba M.D.   On: 01/27/2023 18:39   CT HEAD CODE STROKE WO CONTRAST  Result Date: 01/27/2023 CLINICAL DATA:  Code stroke.  Altered mental status EXAM: CT HEAD WITHOUT CONTRAST TECHNIQUE: Contiguous axial images were obtained from the base of the skull through the vertex without intravenous contrast. RADIATION DOSE REDUCTION: This exam was performed according to the departmental dose-optimization program which includes automated exposure control, adjustment of the  mA and/or kV  according to patient size and/or use of iterative reconstruction technique. COMPARISON:  None Available. FINDINGS: Brain: Ill-defined hypodensity in the left superior cerebellum (series 6, image 55), which could indicate an acute or subacute infarct. Encephalomalacia in the right frontal lobe is favored to represent sequela of a more remote infarct (series 4, image 15). No evidence of acute hemorrhage, mass, mass effect, or midline shift. No hydrocephalus or extra-axial collection. Vascular: No hyperdense vessel. Atherosclerotic calcifications in the intracranial carotid and vertebral arteries. Skull: Negative for fracture or focal lesion. Sinuses/Orbits: Mucosal thickening in the maxillary sinuses, frontal sinuses, and ethmoid air cells. Status post bilateral lens replacements. Other: The mastoid air cells are well aerated. ASPECTS ([place] Stroke Program Early CT Score) - Ganglionic level infarction (caudate, lentiform nuclei, internal capsule, insula, M1-M3 cortex): 7 - Supraganglionic infarction (M4-M6 cortex): 3 Total score (0-10 with 10 being normal): 10 IMPRESSION: 1. Ill-defined hypodensity in the left superior cerebellum, which could indicate acute to subacute infarct. 2. Encephalomalacia in the right frontal lobe favored to represent sequela of a more remote infarct. 3. No hemorrhage. Imaging results were communicated on 01/27/2023 at 5:47 pm to provider Dr. Rory Percy via secure text paging. Electronically Signed   By: Merilyn Baba M.D.   On: 01/27/2023 17:48    Pending Labs Unresulted Labs (From admission, onward)     Start     Ordered   02/03/23 0500  Creatinine, serum  (enoxaparin (LOVENOX)    CrCl >/= 30 ml/min)  Weekly,   R     Comments: while on enoxaparin therapy    01/27/23 1924   01/28/23 0500  Lipid panel  (Labs)  Tomorrow morning,   R       Comments: Fasting    01/27/23 1924   01/28/23 0500  Hemoglobin A1c  (Labs)  Tomorrow morning,   R       Comments: To assess prior  glycemic control    01/27/23 1924   01/28/23 0500  CBC  (Labs)  Tomorrow morning,   R        01/27/23 1924   01/28/23 0500  Comprehensive metabolic panel  (Labs)  Tomorrow morning,   R        01/27/23 1924   01/27/23 1933  Blood gas, venous  Once,   R        01/27/23 1932   01/27/23 1933  Lactic acid, plasma  Once,   R        01/27/23 1932   01/27/23 1925  Rapid urine drug screen (hospital performed)  ONCE - STAT,   STAT        01/27/23 1924            Vitals/Pain Today's Vitals   01/27/23 1825 01/27/23 1830 01/27/23 1846 01/27/23 1900  BP: 111/79 (!) 145/96    Pulse: 93 (!) 104  91  Resp: (!) 24 17  17   Temp:      TempSrc:      SpO2: 96% 95%  95%  Weight:   98 kg   Height:   5\' 10"  (1.778 m)     Isolation Precautions No active isolations  Medications Medications  aspirin EC tablet 81 mg (has no administration in time range)   stroke: early stages of recovery book (has no administration in time range)  0.9 %  sodium chloride infusion (has no administration in time range)  acetaminophen (TYLENOL) tablet 650 mg (has no administration in time range)    Or  acetaminophen (  TYLENOL) 160 MG/5ML solution 650 mg (has no administration in time range)    Or  acetaminophen (TYLENOL) suppository 650 mg (has no administration in time range)  senna-docusate (Senokot-S) tablet 1 tablet (has no administration in time range)  enoxaparin (LOVENOX) injection 40 mg (has no administration in time range)  levETIRAcetam (KEPPRA) 2,000 mg in sodium chloride 0.9 % 250 mL IVPB (has no administration in time range)  LORazepam (ATIVAN) injection 1 mg (has no administration in time range)  iohexol (OMNIPAQUE) 350 MG/ML injection 150 mL (150 mLs Intravenous Contrast Given 01/27/23 1814)    Mobility walks with person assist     Focused Assessments Neuro Assessment Handoff:  Swallow screen pass? Yes  Cardiac Rhythm: Atrial fibrillation NIH Stroke Scale  Dizziness Present: (S) No (unknown  if patient is understanding the questions being asked; when asked if is dizzy or has a headache, patient responds "what do you want.") Headache Present: (S) No (unknown if patient is understanding the questions being asked; when asked if is dizzy or has a headache, patient responds "what do you want.") Interval: Shift assessment Level of Consciousness (1a.)   : Alert, keenly responsive LOC Questions (1b. )   : Answers neither question correctly LOC Commands (1c. )   : Performs one task correctly Best Gaze (2. )  : Normal Visual (3. )  : No visual loss Facial Palsy (4. )    : Normal symmetrical movements Motor Arm, Left (5a. )   : No drift Motor Arm, Right (5b. ) : Drift Motor Leg, Left (6a. )  : No drift Motor Leg, Right (6b. ) : No drift Limb Ataxia (7. ): Absent Sensory (8. )  : Mild-to-moderate sensory loss, patient feels pinprick is less sharp or is dull on the affected side, or there is a loss of superficial pain with pinprick, but patient is aware of being touched Best Language (9. )  : Severe aphasia Dysarthria (10. ): Mild-to-moderate dysarthria, patient slurs at least some words and, at worst, can be understood with some difficulty Extinction/Inattention (11.)   : No Abnormality Complete NIHSS TOTAL: 8 Last date known well: 01/27/23 Last time known well:  (unsure at this point) Neuro Assessment: Exceptions to [facility] Neuro Checks:   Initial (01/27/23 1800)  Has TPA been given? No If patient is a Neuro Trauma and patient is going to OR before floor call report to [place] nurse: [phone] or [phone]   R Recommendations: See Admitting Provider Note  Report given to:   Additional Notes:

## 2023-01-27 NOTE — ED Notes (Signed)
Pt arrives to room 1 from CT

## 2023-01-27 NOTE — ED Notes (Signed)
Pt able to ambulate independently to the bathroom under supervision.

## 2023-01-27 NOTE — ED Notes (Signed)
Admitting provider, Dr. Myna Hidalgo at patient bedside.

## 2023-01-27 NOTE — ED Notes (Signed)
Patient ambulating to restroom with assistance from [place], [place].

## 2023-01-27 NOTE — ED Triage Notes (Signed)
PER EMS: pt drove himself to the [facility] today and arrived at 1615. Friends reported when he arrived he was "not acting like himself." EMS arrived to find his speech garbled, word salad and they noticed left sided facial drooping. He was combative and agitated with EMS. They adm 5mg  versed. 400cc NaCl.   LSN unknown. Daughter is at bedside.   BP-122/87, HR-110, O2-97%, CBG-121 18g LAC

## 2023-01-27 NOTE — Consult Note (Signed)
Neurology Consultation  Reason for Consult: code stroke Referring Physician: Dr Sharlett Iles  CC: confusion, garbled speech History is obtained from: chart, daughter   HPI: Juan [name] is a 89 y.o. male PMH of arrhythmia, HLD, does not see doctors, brought in from [place], where he meets with the strains every Friday to have dinner and drinks, noted to be not acting like himself and speech be garbled when he arrived that it 1615 hrs.  Unclear of last known well because nobody had seen him.  Daughter saw him on Monday last.  Has another daughter who lives there but she has had a stroke herself and could not provide details on the timing of last known well. Patient unable to provide reliable history at this time. EMS was given the history of symptoms onset as the last known well at 4:15 PM, hence activation of the code stroke. He has some sort of arrhythmia documented on his chart-daughter does not know what arrhythmia that is.  He was admitted last in the hospital in 2012 for shingles.  Has not seen a doctor since.   Extremely challenging imaging-did not obey commands and moved a lot causing extensive motion artifacts precluding very good imaging assessment.  [place]-unclear what kind of drinks.   LKW: Unclear IV thrombolysis given?: no, unclear last well EVT: No ELVO Premorbid modified Rankin scale (mRS): Presumably 0  ROS: Unable to assess obtain due to altered mental status  Past Medical History:  Diagnosis Date   Arrhythmia    Cancer ([place])    Hyperlipidemia    Prostate cancer ([place])    Shingles outbreak    LEFT CHEST WALL   History reviewed. No pertinent family history.   Social History:   reports that he has never smoked. He does not have any smokeless tobacco history on file. He reports that he does not use drugs. No history on file for alcohol use.  Medications No current facility-administered medications for this encounter.  Current  Outpatient Medications:    aspirin 81 MG tablet, Take 81 mg by mouth daily., Disp: , Rfl:    pravastatin (PRAVACHOL) 40 MG tablet, Take 40 mg by mouth daily., Disp: , Rfl:    Exam: Current vital signs: BP (!) 145/96   Pulse (!) 104   Temp 98.4 F (36.9 C) (Oral)   Resp 17   Ht 5\' 10"  (1.778 m)   Wt 98 kg   SpO2 95%   BMI 31.00 kg/m  Vital signs in last 24 hours: Temp:  [98.4 F (36.9 C)] 98.4 F (36.9 C) (03/22 1820) Pulse Rate:  [93-104] 104 (03/22 1830) Resp:  [16-24] 17 (03/22 1830) BP: (111-145)/(79-96) 145/96 (03/22 1830) SpO2:  [95 %-96 %] 95 % (03/22 1830) Weight:  [98 kg] 98 kg (03/22 1846) Patient is awake alert does not appear to be any distress HEENT: Normocephalic, atraumatic CVS: Regular rate rhythm Abdomen nondistended nontender Chest: Clear Neurological exam He is awake alert Does not follow commands He is talking long sentences which she starts with some comprehensible words but then the speech becomes extremely garbled. Moderate dysarthria Unable to repeat Cranial nerves II to XII appear intact Motor examination with no drift in any of the upper extremities.  Both lower extremities have equal and symmetric drift. Sensation appears intact based on grimace and withdrawal to noxious stimulation Coordination difficult to assess given mentation NIH stroke scale-12     Labs I have reviewed labs in epic and  the results pertinent to this consultation are:  CBC    Component Value Date/Time   WBC 9.1 01/27/2023 1731   RBC 3.70 (L) 01/27/2023 1731   HGB 13.3 01/27/2023 1738   HCT 39.0 01/27/2023 1738   PLT 252 01/27/2023 1731   MCV 105.1 (H) 01/27/2023 1731   MCH 36.2 (H) 01/27/2023 1731   MCHC 34.4 01/27/2023 1731   RDW 13.0 01/27/2023 1731   LYMPHSABS 1.2 01/27/2023 1731   MONOABS 1.3 (H) 01/27/2023 1731   EOSABS 0.3 01/27/2023 1731   BASOSABS 0.1 01/27/2023 1731    CMP     Component Value Date/Time   NA 137 01/27/2023 1738   K 3.6  01/27/2023 1738   CL 105 01/27/2023 1738   CO2 16 (L) 01/27/2023 1731   GLUCOSE 123 (H) 01/27/2023 1738   BUN 20 01/27/2023 1738   CREATININE 1.20 01/27/2023 1738   CALCIUM 8.4 (L) 01/27/2023 1731   PROT 6.2 (L) 01/27/2023 1731   ALBUMIN 3.4 (L) 01/27/2023 1731   AST 24 01/27/2023 1731   ALT 21 01/27/2023 1731   ALKPHOS 74 01/27/2023 1731   BILITOT 0.8 01/27/2023 1731   GFRNONAA 59 (L) 01/27/2023 1731   GFRAA  04/11/2011 0500    >60        The eGFR has been calculated using the MDRD equation. This calculation has not been validated in all clinical situations. eGFR's persistently <60 mL/min signify possible Chronic Kidney Disease.    Imaging I have reviewed the images obtained:  CT-head: Right frontal hypodensity likely chronic.  Left cerebellar hypodensity subacute to chronic-age-indeterminate.  No bleed. CT angiography of the head and neck question asymmetric filling on the left MCA compared to the right but the exam is show motion riddled that it is very hard to be certain.  CT perfusion study does not show any large area of perfusion deficit for him to be an intervention candidate.  Assessment: 89 year old man with past history documented of arrhythmia and hyperlipidemia and not seen a physician for years, brought in from [place], noticed by his friends to have arrived there with garbled speech.  Unclear last known well because nobody had seen him.  Daughter had seen him last 5 days ago.  Family that he lives with his another daughter who has had a stroke and could not provide reliable history of last known well.  There is some question of him not feeling well for the past few days in terms of his speech with repetitive speech. 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 Extremely challenging to obtain good imaging due to agitation and movement.  Received 5 mg of Versed in the field but still remained very  agitated.  I did not do more sedating medications for the fear of worsening his neurological exam. Vessel imaging concerning for a possible asymmetry in the left MCA territory in terms of vessel opacification but extremely emotional results can preclude a good analysis.  CT perfusion study not suggestive of a good perfusion deficit that might be amenable to intervention.  He will need admission irrespective for possible subacute stroke versus seizure as the differential at this time.  Impression: Stroke versus seizure versus toxic metabolic encephalopathy  Recommendations: Admit to hospitalist Frequent neurochecks MRI of the brain when able to Routine EEG I would give him a loading dose of Keppra 2 g IV for now. Decision on continuing antiepileptics based on MRI and EEG  results I will do 2D echocardiogram, A1c and lipid panel as a part of the stroke risk factor workup. I will also recommend giving him an aspirin 81 mg Statin decision based on LDL levels-check LDL and A1c levels. PT, OT, speech therapy Maintain seizure precautions with the presumption that this might be an underlying seizure as well. Check UDS and alcohol level I will follow with you tomorrow and provide further recommendations based on clinical course and test results. Plan was discussed with Dr. Sharlett Iles in the ER Plan was also discussed in detail with the daughter.  Not a candidate for IV TNKase due to unclear last known well No EVT due to no clear large vessel occlusion.  -- Amie Portland, MD Neurologist [facility] Pager: [phone]   CRITICAL CARE ATTESTATION Performed by: Amie Portland, MD Total critical care time: 60 minutes Critical care time was exclusive of separately billable procedures and treating other patients and/or supervising APPs/Residents/Students Critical care was necessary to treat or prevent imminent or life-threatening deterioration. This patient is critically ill and at  significant risk for neurological worsening and/or death and care requires constant monitoring. Critical care was time spent personally by me on the following activities: development of treatment plan with patient and/or surrogate as well as nursing, discussions with consultants, evaluation of patient's response to treatment, examination of patient, obtaining history from patient or surrogate, ordering and performing treatments and interventions, ordering and review of laboratory studies, ordering and review of radiographic studies, pulse oximetry, re-evaluation of patient's condition, participation in multidisciplinary rounds and medical decision making of high complexity in the care of this patient.

## 2023-01-27 NOTE — ED Provider Notes (Signed)
[facility] Provider Note   CSN: [id number] Arrival date & time: 01/27/23  1728  An emergency department physician performed an initial assessment on this suspected stroke patient at 1740.  History  Chief Complaint  Patient presents with   Code Stroke    Juan [name] is a 89 y.o. male.  89 year old male with a history of paroxysmal atrial fibrillation, hyperlipidemia, and prostate cancer who presents emergency department with altered mental status.  Patient was last seen by his daughter at 6:30 PM on Monday evening this week and was reportedly himself.  Spoke with his other daughter at 31 AM this morning and appeared slightly confused.  Went to a bar at 1615 today and was noted to be having difficulty speaking as well as bilateral lower extremity weakness and was brought into the emergency department for evaluation.  Did state that he had 2 drinks of alcohol today.  No known history of strokes per patient's daughter.       Home Medications Prior to Admission medications   Medication Sig Start Date End Date Taking? Authorizing Provider  aspirin 81 MG tablet Take 81 mg by mouth daily.    [provider]  pravastatin (PRAVACHOL) 40 MG tablet Take 40 mg by mouth daily.    [provider]      Allergies    Patient has no known allergies.    Review of Systems   Review of Systems  Physical Exam Updated Vital Signs BP (!) 155/92   Pulse 91   Temp 97.8 F (36.6 C) (Oral)   Resp 18   Ht 5\' 10"  (1.778 m)   Wt 98 kg   SpO2 95%   BMI 31.00 kg/m  Physical Exam Vitals and nursing note reviewed.  Constitutional:      General: He is not in acute distress.    Appearance: He is well-developed.     Comments: Appears to be confused and perseverates.  Tracks.  Repeatedly saying "what are we waiting on" when asked questions.  Does follow commands.  HENT:     Head: Normocephalic and atraumatic.     Right Ear: External  ear normal.     Left Ear: External ear normal.     Nose: Nose normal.  Eyes:     Extraocular Movements: Extraocular movements intact.     Conjunctiva/sclera: Conjunctivae normal.     Pupils: Pupils are equal, round, and reactive to light.     Comments: Pupils 4 mm bilaterally  Cardiovascular:     Rate and Rhythm: Normal rate and regular rhythm.     Heart sounds: Normal heart sounds.  Pulmonary:     Effort: Pulmonary effort is normal. No respiratory distress.     Breath sounds: Normal breath sounds.  Abdominal:     General: There is no distension.     Palpations: Abdomen is soft. There is no mass.     Tenderness: There is no abdominal tenderness. There is no guarding.  Musculoskeletal:     Cervical back: Normal range of motion and neck supple.     Right lower leg: No edema.     Left lower leg: No edema.  Skin:    General: Skin is warm and dry.  Neurological:     Mental Status: He is alert. He is disoriented.     Comments: Patient perseverating.  May be representative of expressive aphasia.  Full strength in all 4 extremities with intact sensation to light touch.  Patient appears to have difficulty with balance when sitting upright.  Psychiatric:        Mood and Affect: Mood normal.        Behavior: Behavior normal.     ED Results / Procedures / Treatments   Labs (all labs ordered are listed, but only abnormal results are displayed) Labs Reviewed  CBC - Abnormal; Notable for the following components:      Result Value   RBC 3.70 (*)    HCT 38.9 (*)    MCV 105.1 (*)    MCH 36.2 (*)    All other components within normal limits  DIFFERENTIAL - Abnormal; Notable for the following components:   Monocytes Absolute 1.3 (*)    All other components within normal limits  COMPREHENSIVE METABOLIC PANEL - Abnormal; Notable for the following components:   CO2 16 (*)    Glucose, Bld 126 (*)    Calcium 8.4 (*)    Total Protein 6.2 (*)    Albumin 3.4 (*)    GFR, Estimated 59 (*)     All other components within normal limits  ETHANOL - Abnormal; Notable for the following components:   Alcohol, Ethyl (B) 12 (*)    All other components within normal limits  CBC - Abnormal; Notable for the following components:   RBC 3.43 (*)    Hemoglobin 12.1 (*)    HCT 35.8 (*)    MCV 104.4 (*)    MCH 35.3 (*)    All other components within normal limits  COMPREHENSIVE METABOLIC PANEL - Abnormal; Notable for the following components:   CO2 21 (*)    Calcium 8.2 (*)    Total Protein 5.5 (*)    Albumin 2.9 (*)    GFR, Estimated 60 (*)    All other components within normal limits  RAPID URINE DRUG SCREEN, HOSP PERFORMED - Abnormal; Notable for the following components:   Benzodiazepines POSITIVE (*)    All other components within normal limits  BLOOD GAS, VENOUS - Abnormal; Notable for the following components:   pCO2, Ven 42 (*)    All other components within normal limits  I-STAT CHEM 8, ED - Abnormal; Notable for the following components:   Glucose, Bld 123 (*)    Calcium, Ion 1.06 (*)    TCO2 19 (*)    All other components within normal limits  CBG MONITORING, ED - Abnormal; Notable for the following components:   Glucose-Capillary 124 (*)    All other components within normal limits  PROTIME-INR  APTT  LIPID PANEL  LACTIC ACID, PLASMA  HEMOGLOBIN A1C    EKG None  Radiology EEG adult  Result Date: 01/28/2023 Lora Havens, MD     01/28/2023 10:06 AM Patient Name: Juan [name] MRN: [id number] Epilepsy Attending: Lora Havens Referring Physician/Provider: [name], MD Date: 01/28/2023 Duration: 26.39 mins Patient history: 89yo M with ams. EEG to evaluate for seizure Level of alertness: Awake AEDs during EEG study: Ativan Technical aspects: This EEG study was done with scalp electrodes positioned according to the 10-20 International system of electrode placement. Electrical activity was reviewed with band pass filter of 1-70Hz , sensitivity of 7 uV/mm,  display speed of 49mm/sec with a 60Hz  notched filter applied as appropriate. EEG data were recorded continuously and digitally stored.  Video monitoring was available and reviewed as appropriate. Description: The posterior dominant rhythm consists of 8 Hz activity of moderate voltage (25-35 uV) seen predominantly in posterior head regions, symmetric  and reactive to eye opening and eye closing. EEG showed continuous 3 to 6 Hz theta-delta slowing in left temporal region. Hyperventilation and photic stimulation were not performed.   ABNORMALITY - Continuous slow, left temporal region IMPRESSION: This study is suggestive of cortical dysfunction arising from left temporal region likely secondary to underlying structural abnormality. No seizures or epileptiform discharges were seen throughout the recording. Lora Havens   MR BRAIN WO CONTRAST  Result Date: 01/28/2023 CLINICAL DATA:  Follow-up examination for stroke. EXAM: MRI HEAD WITHOUT CONTRAST TECHNIQUE: Multiplanar, multiecho pulse sequences of the brain and surrounding structures were obtained without intravenous contrast. COMPARISON:  Prior CTs from 01/27/2023. FINDINGS: Brain: Examination is technically limited as the patient was unable to tolerate the full length of the study. Diffusion-weighted sequences and sagittal T1 weighted sequence only were performed. Age-related cerebral atrophy. Encephalomalacia at the anterior right frontal lobe consistent with a chronic right MCA territory infarct. Confluent restricted diffusion involving the left temporal lobe and temporal occipital region, consistent with an acute left MCA territory infarct (series 5, image 69). Mild patchy involvement of the left insular cortex. Additional small volume cortical involvement at the high posterior left parietal lobe noted (series 5, image 81). Additional restricted diffusion involving the superior left cerebellar hemisphere, consistent with an evolving left superior cerebellar  artery infarct (series 5, image 60). Few additional punctate cortical infarcts noted at the right occipital lobe (series 5, images 64, 66). Additional punctate right cerebellar infarct (series 5, image 56). Overall, given the various vascular distributions involved, a central thromboembolic etiology is suspected. No significant mass effect. Evaluation for associated blood products limited on this limited exam, however, there are suspected associated petechial blood products at the left temporal lobe (series 5, image 69). No associated hemorrhage visible on prior CT. No other acute or subacute ischemia. Gray-white matter differentiation otherwise grossly maintained. No visible mass lesion. No significant mass effect or midline shift. No hydrocephalus. No extra-axial fluid collection. Pituitary gland and suprasellar region grossly within normal limits. Vascular: Major intracranial vascular flow voids are not well assessed on this limited exam. Skull and upper cervical spine: Craniocervical junction grossly within normal limits. Bone marrow signal intensity within normal limits. No scalp soft tissue abnormality. Sinuses/Orbits: Globes orbital soft tissues demonstrate no obvious abnormality. Chronic paranasal sinus disease, better appreciated on prior CT. Other: None. IMPRESSION: 1. Technically limited and truncated exam due to the patient's inability to tolerate the full length of the study. 2. Moderate-sized acute left MCA territory infarct involving the left temporal lobe/temporoccipital region. Suspected associated petechial blood products at the left temporal lobe. 3. Additional acute left superior cerebellar artery infarct, with a few additional punctate infarcts involving the right occipital lobe and right cerebellum. Given the various vascular distributions involved, a central thromboembolic etiology is suspected. 4. Chronic right frontal lobe infarct, right MCA distribution. Electronically Signed   By: Jeannine Boga M.D.   On: 01/28/2023 03:52   CT ANGIO HEAD NECK W WO CM W PERF (CODE STROKE)  Result Date: 01/27/2023 CLINICAL DATA:  Stroke suspected EXAM: CT ANGIOGRAPHY HEAD AND NECK TECHNIQUE: Multidetector CT imaging of the head and neck was performed using the standard protocol during bolus administration of intravenous contrast. Multiplanar CT image reconstructions and MIPs were obtained to evaluate the vascular anatomy. Carotid stenosis measurements (when applicable) are obtained utilizing NASCET criteria, using the distal internal carotid diameter as the denominator. RADIATION DOSE REDUCTION: This exam was performed according to the departmental dose-optimization program which includes  automated exposure control, adjustment of the mA and/or kV according to patient size and/or use of iterative reconstruction technique. CONTRAST:  165mL OMNIPAQUE IOHEXOL 350 MG/ML SOLN COMPARISON:  No prior CTA available, correlation is made with 01/27/2023 CT head FINDINGS: Evaluation is significantly limited by motion artifact. CT HEAD FINDINGS For noncontrast findings, please see same day CT head. CTA NECK FINDINGS Aortic arch: Two-vessel arch with a common origin of the brachiocephalic and left common carotid arteries. Imaged portion shows no evidence of aneurysm or dissection. No significant stenosis of the major arch vessel origins. Aortic atherosclerosis. Right carotid system: No definite stenosis at the bifurcation or in the ICA. Left carotid system: No definite stenosis at the bifurcation or in the ICA. Vertebral arteries: Evaluation of the V1 and majority of the V2 segments is limited by motion. No significant stenosis in the distal V2 or V3 segment. Skeleton: No definite acute osseous abnormality, although evaluation is motion limited. Other neck: No acute finding. Upper chest: No focal pulmonary opacity or pleural effusion. Review of the MIP images confirms the above findings CTA HEAD FINDINGS Anterior  circulation: Both internal carotid arteries are patent to the termini, with calcifications but without significant stenosis. A1 segments patent. Normal anterior communicating artery. Patent A 2 segments. More distal ACA segments are unable to be evaluated due to motion. No M1 stenosis or occlusion. Proximal M2 segments are patent. Evaluation of more distal MCA branches is limited by motion, however there is a suggestion of slightly diminished opacification of the anterior left MCA branches compared to anterior right MCA branches (series 19, image 19). Posterior circulation: Vertebral arteries patent to the vertebrobasilar junction with mild stenosis in the mid to distal V4. Basilar patent to its distal aspect, with mild stenosis in the mid to distal basilar artery. Superior cerebellar arteries patent proximally. Patent P1 segments. Patent P2 segments. More distal PCA segment evaluation is limited by motion. Venous sinuses: As permitted by contrast timing, patent. Anatomic variants: None significant. Review of the MIP images confirms the above findings IMPRESSION: 1. Significantly motion degraded exam, with no definite proximal intracranial large vessel occlusion. There is a suggestion of slightly diminished opacification of the anterior left MCA branches compared to the anterior right MCA branches. 2. No hemodynamically significant stenosis in the neck. 3. Aortic atherosclerosis. Aortic Atherosclerosis (ICD10-I70.0). These findings were discussed by telephone on 01/27/2023 at 6:20 pm with provider ASHISH ARORA . Electronically Signed   By: Merilyn Baba M.D.   On: 01/27/2023 18:39   CT HEAD CODE STROKE WO CONTRAST  Result Date: 01/27/2023 CLINICAL DATA:  Code stroke.  Altered mental status EXAM: CT HEAD WITHOUT CONTRAST TECHNIQUE: Contiguous axial images were obtained from the base of the skull through the vertex without intravenous contrast. RADIATION DOSE REDUCTION: This exam was performed according to the  departmental dose-optimization program which includes automated exposure control, adjustment of the mA and/or kV according to patient size and/or use of iterative reconstruction technique. COMPARISON:  None Available. FINDINGS: Brain: Ill-defined hypodensity in the left superior cerebellum (series 6, image 55), which could indicate an acute or subacute infarct. Encephalomalacia in the right frontal lobe is favored to represent sequela of a more remote infarct (series 4, image 15). No evidence of acute hemorrhage, mass, mass effect, or midline shift. No hydrocephalus or extra-axial collection. Vascular: No hyperdense vessel. Atherosclerotic calcifications in the intracranial carotid and vertebral arteries. Skull: Negative for fracture or focal lesion. Sinuses/Orbits: Mucosal thickening in the maxillary sinuses, frontal sinuses, and ethmoid air  cells. Status post bilateral lens replacements. Other: The mastoid air cells are well aerated. ASPECTS ([place] Stroke Program Early CT Score) - Ganglionic level infarction (caudate, lentiform nuclei, internal capsule, insula, M1-M3 cortex): 7 - Supraganglionic infarction (M4-M6 cortex): 3 Total score (0-10 with 10 being normal): 10 IMPRESSION: 1. Ill-defined hypodensity in the left superior cerebellum, which could indicate acute to subacute infarct. 2. Encephalomalacia in the right frontal lobe favored to represent sequela of a more remote infarct. 3. No hemorrhage. Imaging results were communicated on 01/27/2023 at 5:47 pm to provider Dr. Rory Percy via secure text paging. Electronically Signed   By: Merilyn Baba M.D.   On: 01/27/2023 17:48    Procedures Procedures   Medications Ordered in ED Medications  aspirin EC tablet 81 mg (81 mg Oral Given 01/28/23 0938)   stroke: early stages of recovery book (has no administration in time range)  0.9 %  sodium chloride infusion (0 mLs Intravenous Stopped 01/28/23 1021)  acetaminophen (TYLENOL) tablet 650 mg (has no administration  in time range)    Or  acetaminophen (TYLENOL) 160 MG/5ML solution 650 mg (has no administration in time range)    Or  acetaminophen (TYLENOL) suppository 650 mg (has no administration in time range)  senna-docusate (Senokot-S) tablet 1 tablet (has no administration in time range)  enoxaparin (LOVENOX) injection 40 mg (40 mg Subcutaneous Given 01/27/23 2146)  rosuvastatin (CRESTOR) tablet 20 mg (20 mg Oral Given 01/28/23 0938)  iohexol (OMNIPAQUE) 350 MG/ML injection 150 mL (150 mLs Intravenous Contrast Given 01/27/23 1814)  levETIRAcetam (KEPPRA) 2,000 mg in sodium chloride 0.9 % 250 mL IVPB (2,000 mg Intravenous New Bag/Given 01/27/23 2154)  LORazepam (ATIVAN) injection 1 mg (1 mg Intravenous Given 01/28/23 0245)  haloperidol lactate (HALDOL) injection 5 mg (5 mg Intravenous Given 01/27/23 2331)    ED Course/ Medical Decision Making/ A&P Clinical Course as of 01/28/23 1108  Fri Jan 27, 2023  1852 Per Dr. Malen Gauze from neurology admit to medicine for further management. [RP]  1913 Dr Myna Hidalgo from hospitalist will except the patient. [RP]    Clinical Course User Index [RP] Fransico Meadow, MD                            Medical Decision Making Amount and/or Complexity of Data Reviewed Labs: ordered. Radiology: ordered.  Risk Decision regarding hospitalization.   DURREL OSTROM is a 89 y.o. male with comorbidities that complicate the patient evaluation including atrial fibrillation, hyperlipidemia, and prostate cancer who presents emergency department with altered mental status.     Initial Ddx:  Stroke, seizure, ICH, hypoglycemia, UTI/electrolyte abnormality, intoxication  MDM:  Concern the patient may have had a stroke with his present symptoms including expressive aphasia.  Unclear when his last known well was.  Code stroke was activated in the emergency department will obtain CT head and vessel imaging.  Will check labs to assess for any electrolyte abnormalities or UTI.  Will also  check alcohol level to see if he is intoxicated at this time.  Plan:  Code stroke activation CT head CTA Labs Urine studies EKG  ED Summary/Re-evaluation:  Patient had CT scan that showed evidence of cerebellar and frontal lobe stroke.  After discussions with the patient's daughter regarding these findings and his last known well decision was made not to push TNK.  CTA without evidence of LVO.  Patient was then admitted to medicine for further management of his suspected stroke.  Neurology  will also evaluate see if EEG is indicated.  This patient presents to the ED for concern of complaints listed in HPI, this involves an extensive number of treatment options, and is a complaint that carries with it a high risk of complications and morbidity. Disposition including potential need for admission considered.   Dispo: Admit to Floor  Additional history obtained from daughter Records reviewed Outpatient Clinic Notes The following labs were independently interpreted: Chemistry and show no acute abnormality I independently reviewed the following imaging with scope of interpretation limited to determining acute life threatening conditions related to emergency care: CT Head and agree with the radiologist interpretation with the following exceptions: None I personally reviewed and interpreted cardiac monitoring: normal sinus rhythm  I personally reviewed and interpreted the pt's EKG: see above for interpretation  I have reviewed the patients home medications and made adjustments as needed Consults: Hospitalist and Neurology Social Determinants of health:  Elderly  Final Clinical Impression(s) / ED Diagnoses Final diagnoses:  Acute ischemic stroke [facility])  Disorientation  Aphasia    Rx / DC Orders ED Discharge Orders     None      CRITICAL CARE Performed by: Fransico Meadow   Total critical care time: 30 minutes  Critical care time was exclusive of separately billable procedures  and treating other patients.  Critical care was necessary to treat or prevent imminent or life-threatening deterioration.  Critical care was time spent personally by me on the following activities: development of treatment plan with patient and/or surrogate as well as nursing, discussions with consultants, evaluation of patient's response to treatment, examination of patient, obtaining history from patient or surrogate, ordering and performing treatments and interventions, ordering and review of laboratory studies, ordering and review of radiographic studies, pulse oximetry and re-evaluation of patient's condition.    Fransico Meadow, MD 01/28/23 [phone]

## 2023-01-28 ENCOUNTER — Observation Stay (HOSPITAL_COMMUNITY): Payer: Medicare Other

## 2023-01-28 DIAGNOSIS — R4701 Aphasia: Secondary | ICD-10-CM | POA: Diagnosis present

## 2023-01-28 DIAGNOSIS — I480 Paroxysmal atrial fibrillation: Secondary | ICD-10-CM

## 2023-01-28 DIAGNOSIS — G9340 Encephalopathy, unspecified: Secondary | ICD-10-CM

## 2023-01-28 DIAGNOSIS — Z8546 Personal history of malignant neoplasm of prostate: Secondary | ICD-10-CM | POA: Diagnosis not present

## 2023-01-28 DIAGNOSIS — R0981 Nasal congestion: Secondary | ICD-10-CM | POA: Diagnosis present

## 2023-01-28 DIAGNOSIS — E669 Obesity, unspecified: Secondary | ICD-10-CM | POA: Diagnosis present

## 2023-01-28 DIAGNOSIS — I639 Cerebral infarction, unspecified: Secondary | ICD-10-CM | POA: Diagnosis present

## 2023-01-28 DIAGNOSIS — N1831 Chronic kidney disease, stage 3a: Secondary | ICD-10-CM | POA: Diagnosis present

## 2023-01-28 DIAGNOSIS — R4182 Altered mental status, unspecified: Secondary | ICD-10-CM | POA: Diagnosis present

## 2023-01-28 DIAGNOSIS — Z79899 Other long term (current) drug therapy: Secondary | ICD-10-CM | POA: Diagnosis not present

## 2023-01-28 DIAGNOSIS — Z6831 Body mass index (BMI) 31.0-31.9, adult: Secondary | ICD-10-CM | POA: Diagnosis not present

## 2023-01-28 DIAGNOSIS — G319 Degenerative disease of nervous system, unspecified: Secondary | ICD-10-CM | POA: Diagnosis not present

## 2023-01-28 DIAGNOSIS — E872 Acidosis, unspecified: Secondary | ICD-10-CM

## 2023-01-28 DIAGNOSIS — I63412 Cerebral infarction due to embolism of left middle cerebral artery: Secondary | ICD-10-CM | POA: Diagnosis present

## 2023-01-28 DIAGNOSIS — Z7982 Long term (current) use of aspirin: Secondary | ICD-10-CM | POA: Diagnosis not present

## 2023-01-28 DIAGNOSIS — I129 Hypertensive chronic kidney disease with stage 1 through stage 4 chronic kidney disease, or unspecified chronic kidney disease: Secondary | ICD-10-CM | POA: Diagnosis present

## 2023-01-28 DIAGNOSIS — Z23 Encounter for immunization: Secondary | ICD-10-CM | POA: Diagnosis present

## 2023-01-28 DIAGNOSIS — J310 Chronic rhinitis: Secondary | ICD-10-CM | POA: Diagnosis present

## 2023-01-28 DIAGNOSIS — E785 Hyperlipidemia, unspecified: Secondary | ICD-10-CM | POA: Diagnosis present

## 2023-01-28 DIAGNOSIS — I6602 Occlusion and stenosis of left middle cerebral artery: Secondary | ICD-10-CM | POA: Diagnosis not present

## 2023-01-28 DIAGNOSIS — I6389 Other cerebral infarction: Secondary | ICD-10-CM

## 2023-01-28 DIAGNOSIS — R410 Disorientation, unspecified: Secondary | ICD-10-CM | POA: Diagnosis not present

## 2023-01-28 DIAGNOSIS — J3489 Other specified disorders of nose and nasal sinuses: Secondary | ICD-10-CM | POA: Diagnosis not present

## 2023-01-28 DIAGNOSIS — I69320 Aphasia following cerebral infarction: Secondary | ICD-10-CM | POA: Diagnosis not present

## 2023-01-28 DIAGNOSIS — R29712 NIHSS score 12: Secondary | ICD-10-CM | POA: Diagnosis present

## 2023-01-28 LAB — ECHOCARDIOGRAM COMPLETE
Height: 70 in
S' Lateral: 3.5 cm
Weight: 3456.81 oz

## 2023-01-28 LAB — COMPREHENSIVE METABOLIC PANEL
ALT: 19 U/L (ref 0–44)
AST: 20 U/L (ref 15–41)
Albumin: 2.9 g/dL — ABNORMAL LOW (ref 3.5–5.0)
Alkaline Phosphatase: 64 U/L (ref 38–126)
Anion gap: 12 (ref 5–15)
BUN: 13 mg/dL (ref 8–23)
CO2: 21 mmol/L — ABNORMAL LOW (ref 22–32)
Calcium: 8.2 mg/dL — ABNORMAL LOW (ref 8.9–10.3)
Chloride: 102 mmol/L (ref 98–111)
Creatinine, Ser: 1.14 mg/dL (ref 0.61–1.24)
GFR, Estimated: 60 mL/min — ABNORMAL LOW (ref >60)
Glucose, Bld: 88 mg/dL (ref 70–99)
Potassium: 3.8 mmol/L (ref 3.5–5.1)
Sodium: 135 mmol/L (ref 135–145)
Total Bilirubin: 0.7 mg/dL (ref 0.3–1.2)
Total Protein: 5.5 g/dL — ABNORMAL LOW (ref 6.5–8.1)

## 2023-01-28 LAB — LIPID PANEL
Cholesterol: 156 mg/dL (ref 0–200)
HDL: 53 mg/dL (ref >40)
LDL Cholesterol: 90 mg/dL (ref 0–99)
Total CHOL/HDL Ratio: 2.9 RATIO
Triglycerides: 64 mg/dL (ref <150)
VLDL: 13 mg/dL (ref 0–40)

## 2023-01-28 LAB — RAPID URINE DRUG SCREEN, HOSP PERFORMED
Amphetamines: NOT DETECTED
Barbiturates: NOT DETECTED
Benzodiazepines: POSITIVE — AB
Cocaine: NOT DETECTED
Opiates: NOT DETECTED
Tetrahydrocannabinol: NOT DETECTED

## 2023-01-28 LAB — CBC
HCT: 35.8 % — ABNORMAL LOW (ref 39.0–52.0)
Hemoglobin: 12.1 g/dL — ABNORMAL LOW (ref 13.0–17.0)
MCH: 35.3 pg — ABNORMAL HIGH (ref 26.0–34.0)
MCHC: 33.8 g/dL (ref 30.0–36.0)
MCV: 104.4 fL — ABNORMAL HIGH (ref 80.0–100.0)
Platelets: 235 10*3/uL (ref 150–400)
RBC: 3.43 MIL/uL — ABNORMAL LOW (ref 4.22–5.81)
RDW: 13 % (ref 11.5–15.5)
WBC: 6.7 10*3/uL (ref 4.0–10.5)
nRBC: 0 % (ref 0.0–0.2)

## 2023-01-28 MED ORDER — ROSUVASTATIN CALCIUM 20 MG PO TABS
20.0000 mg | ORAL_TABLET | Freq: Every day | ORAL | Status: DC
  Administered 2023-01-28 – 2023-02-01 (×5): 20 mg via ORAL
  Filled 2023-01-28 (×5): qty 1

## 2023-01-28 MED ORDER — LOPERAMIDE HCL 2 MG PO CAPS
4.0000 mg | ORAL_CAPSULE | Freq: Once | ORAL | Status: AC
  Administered 2023-01-28: 4 mg via ORAL
  Filled 2023-01-28: qty 2

## 2023-01-28 MED ORDER — HALOPERIDOL LACTATE 5 MG/ML IJ SOLN
2.0000 mg | Freq: Four times a day (QID) | INTRAMUSCULAR | Status: DC | PRN
  Administered 2023-01-28 – 2023-01-31 (×5): 2 mg via INTRAVENOUS
  Filled 2023-01-28 (×5): qty 1

## 2023-01-28 MED ORDER — LORAZEPAM 2 MG/ML IJ SOLN
1.0000 mg | Freq: Once | INTRAMUSCULAR | Status: AC
  Administered 2023-01-28: 1 mg via INTRAVENOUS
  Filled 2023-01-28: qty 1

## 2023-01-28 NOTE — Hospital Course (Signed)
Juan [name] is a 89 y.o. male with past medical history of hyperlipidemia, prostate cancer and paroxysmal atrial fibrillation not on anticoagulation presented to the hospital with garbled speech, confusion and agitation.  EMS was called, found the patient to be combative, and administered 400 mL of IV fluids and 5 mg Versed prior to arrival in the ED. in the ED patient was afebrile.  Labs showed bicarb of 16, MCV 105.1. Head CT reveals a ill-defined hypodensity in the left superior cerebellum and right frontal encephalomalacia.  Patient was seen by neurology in the ED and was admitted hospital for further evaluation and treatment.  Assessment/Plan    Garbled speech and confusion  Seen by neurology.  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  Possible acute/subacute.   EEG pending.,  Hemoglobin A1c pending.  Lipid panel within normal range.  Urine drug screen shows benzodiazepines.  Patient received Keppra load in the ED, aspirin, seizure precautions.  PT OT speech therapy consulted.  Paroxysmal atrial fibrillation  Patient saw cardiology in 2012 for paroxysmal anticoagulation and was not anticoagulated at that time due to low CHADS2 score.  On aspirin at this time.     Metabolic acidosis  Sodium bicarbonate of 16.   CKD IIIa  Serum creatinine of 1.1.  Continue to monitor.

## 2023-01-28 NOTE — Plan of Care (Signed)
MR brain reviewed. Incomplete and motion degraded. Moderate sized LMCA stroke that is acute or subacute (No FLAIR images). Another acute stroke in the left cerebellum -based on the CT and MRI appearance is subacute.  Needs full stroke w/u as recommended in the consult note from yesterday. Stroke team will follow with you.  -- Amie Portland, MD Neurologist [facility] Pager: [phone]

## 2023-01-28 NOTE — Progress Notes (Signed)
PROGRESS NOTE    Juan [name]  [id number] DOB: 22-Oct-1926 DOA: 01/27/2023 PCP: Pcp, No    Brief Narrative:  Juan [name] is a 89 y.o. male with past medical history of hyperlipidemia, prostate cancer and paroxysmal atrial fibrillation not on anticoagulation presented to the hospital with garbled speech, confusion and agitation.  EMS was called found the patient to be combative, and administered 400 mL of IV fluids and 5 mg Versed prior to arrival in the ED. in the ED patient was afebrile.  Labs showed bicarb of 16, MCV 105.1. Head CT reveals a ill-defined hypodensity in the left superior cerebellum and right frontal encephalomalacia.  Patient was seen by neurology in the ED and was admitted hospital for further evaluation and treatment.  Assessment/Plan    Garbled speech and confusion, dizziness, agitation Seen by neurology.  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  Possible acute/subacute.   EEG with cortical dysfunction but no obvious seizure-like activity.  Hemoglobin A1c pending.  Lipid panel within normal range.  Urine drug screen shows benzodiazepines.  Patient received Keppra load in the ED, aspirin, seizure precautions.  PT OT speech therapy consulted.  OT recommends CIR.  Paroxysmal atrial fibrillation  Patient saw cardiology in 2012 for paroxysmal anticoagulation and was not anticoagulated at that time due to low CHADS2 score.  On aspirin, Crestor at this time.     Metabolic acidosis  Sodium bicarbonate of 16.  Bicarbonate today at 21.   CKD IIIa  Serum creatinine of 1.1.  Continue to monitor.    DVT prophylaxis: enoxaparin (LOVENOX) injection 40 mg Start: 01/27/23 2000   Code Status:     Code Status: Full Code  Disposition: CIR as per OT recommendation.  Status is: Observation  The patient is inpatient because: Acute stroke, need for rehabilitation.   Family Communication:  Spoke with the  patient's daughter at bedside and updated clinical condition of the patient..  Consultants:  Neurology  Procedures:  None  Antimicrobials:  None  Anti-infectives (From admission, onward)    None      Subjective: Today, patient was seen and examined at bedside.  Appears to be confused, disoriented with the EEG leads in place.  Aphasia with intermittent comprehension.  Objective: Vitals:   01/28/23 0757 01/28/23 0800 01/28/23 1202 01/28/23 1317  BP: 102/83 (!) 155/92    Pulse: 87 91 95 (S) 94  Resp: 18   20  Temp: 97.8 F (36.6 C)  (!) 97.3 F (36.3 C)   TempSrc: Oral  Axillary   SpO2: 97% 95% 92% 94%  Weight:      Height:        Intake/Output Summary (Last 24 hours) at 01/28/2023 1343 Last data filed at 01/28/2023 0500 Gross per 24 hour  Intake --  Output 900 ml  Net -900 ml   Filed Weights   01/27/23 1700 01/27/23 1846  Weight: 98 kg 98 kg    Physical Examination: Body mass index is 31 kg/m.  General:  Average built, not in obvious distress, mildly Communicative but intermittently comprehensive.  Has aphasia HENT:   No scleral pallor or icterus noted. Oral mucosa is moist.  Chest:  Clear breath sounds.  Diminished breath sounds bilaterally. No crackles or wheezes.  CVS: S1 &S2 heard. No murmur.  Regular rate and rhythm. Abdomen: Soft, nontender, nondistended.  Bowel sounds are heard.   Extremities: No cyanosis, clubbing or edema.  Peripheral pulses are palpable.  Psych: Alert, awake and aphasic, mildly Communicative, agitated and confused CNS:  No cranial nerve deficits.  Moves all extremities.  Mildly agitated. Skin: Warm and dry.  No rashes noted.  Data Reviewed:   CBC: Recent Labs  Lab 01/27/23 1731 01/27/23 1738 01/28/23 0618  WBC 9.1  --  6.7  NEUTROABS 6.2  --   --   HGB 13.4 13.3 12.1*  HCT 38.9* 39.0 35.8*  MCV 105.1*  --  104.4*  PLT 252  --  [id number]    Basic Metabolic Panel: Recent Labs  Lab 01/27/23 1731 01/27/23 1738 01/28/23 0618   NA 135 137 135  K 3.6 3.6 3.8  CL 106 105 102  CO2 16*  --  21*  GLUCOSE 126* 123* 88  BUN 19 20 13   CREATININE 1.15 1.20 1.14  CALCIUM 8.4*  --  8.2*    Liver Function Tests: Recent Labs  Lab 01/27/23 1731 01/28/23 0618  AST 24 20  ALT 21 19  ALKPHOS 74 64  BILITOT 0.8 0.7  PROT 6.2* 5.5*  ALBUMIN 3.4* 2.9*     Radiology Studies: ECHOCARDIOGRAM COMPLETE  Result Date: 01/28/2023    ECHOCARDIOGRAM REPORT   Patient Name:   Juan [name] Date of Exam: 01/28/2023 Medical Rec #:  [id number]         Height:       70.0 in Accession #:    [id number]        Weight:       216.0 lb Date of Birth:  12/20/1925        BSA:          2.157 m Patient Age:    30 years          BP:           145/83 mmHg Patient Gender: M                 HR:           89 bpm. Exam Location:  Inpatient Procedure: 2D Echo, Cardiac Doppler and Color Doppler Indications:    stroke  History:        Patient has no prior history of Echocardiogram examinations.                 Chronic kidney disease. Cancer; Arrythmias:Paroxysmal A-fib.  Sonographer:    Johny Chess RDCS Referring Phys: [id number] TIMOTHY S OPYD  Sonographer Comments: Image acquisition challenging due to uncooperative patient. IMPRESSIONS  1. Left ventricular ejection fraction, by estimation, is 55 to 60%. The left ventricle has normal function. The left ventricle has no regional wall motion abnormalities. There is mild concentric left ventricular hypertrophy. Left ventricular diastolic parameters are indeterminate.  2. Right ventricular systolic function is normal. The right ventricular size is normal. There is moderately elevated pulmonary artery systolic pressure. The estimated right ventricular systolic pressure is 123XX123 mmHg.  3. Left atrial size was mildly dilated.  4. The mitral valve is grossly normal. Mild mitral valve regurgitation.  5. The aortic valve is tricuspid. There is mild calcification of the aortic valve. Aortic valve regurgitation is mild.  Aortic valve sclerosis/calcification is present, without any evidence of aortic stenosis.  6. Aortic dilatation noted. There is mild dilatation of the ascending aorta, measuring 40 mm.  7. The inferior vena cava is dilated in size with <50% respiratory variability, suggesting right atrial pressure of 15 mmHg. Comparison(s): No prior Echocardiogram. FINDINGS  Left Ventricle: Left ventricular ejection fraction, by estimation,  is 55 to 60%. The left ventricle has normal function. The left ventricle has no regional wall motion abnormalities. The left ventricular internal cavity size was normal in size. There is  mild concentric left ventricular hypertrophy. Left ventricular diastolic function could not be evaluated due to atrial fibrillation. Left ventricular diastolic parameters are indeterminate. Right Ventricle: The right ventricular size is normal. No increase in right ventricular wall thickness. Right ventricular systolic function is normal. There is moderately elevated pulmonary artery systolic pressure. The tricuspid regurgitant velocity is 3.28 m/s, and with an assumed right atrial pressure of 15 mmHg, the estimated right ventricular systolic pressure is 123XX123 mmHg. Left Atrium: Left atrial size was mildly dilated. Right Atrium: Right atrial size was normal in size. Pericardium: There is no evidence of pericardial effusion. Presence of epicardial fat layer. Mitral Valve: The mitral valve is grossly normal. Mild mitral annular calcification. Mild mitral valve regurgitation. Tricuspid Valve: The tricuspid valve is grossly normal. Tricuspid valve regurgitation is mild. Aortic Valve: The aortic valve is tricuspid. There is mild calcification of the aortic valve. There is mild to moderate aortic valve annular calcification. Aortic valve regurgitation is mild. Aortic valve sclerosis/calcification is present, without any evidence of aortic stenosis. Pulmonic Valve: The pulmonic valve was grossly normal. Pulmonic valve  regurgitation is trivial. Aorta: Aortic dilatation noted. There is mild dilatation of the ascending aorta, measuring 40 mm. Venous: The inferior vena cava is dilated in size with less than 50% respiratory variability, suggesting right atrial pressure of 15 mmHg. IAS/Shunts: No atrial level shunt detected by color flow Doppler.  LEFT VENTRICLE PLAX 2D LVIDd:         5.00 cm LVIDs:         3.50 cm LV PW:         1.10 cm LV IVS:        1.10 cm LVOT diam:     2.30 cm LVOT Area:     4.15 cm  RIGHT VENTRICLE          IVC RV Basal diam:  3.40 cm  IVC diam: 3.00 cm TAPSE (M-mode): 1.4 cm LEFT ATRIUM              Index        RIGHT ATRIUM           Index LA diam:        5.30 cm  2.46 cm/m   RA Area:     23.80 cm LA Vol (A2C):   122.0 ml 56.56 ml/m  RA Volume:   73.10 ml  33.89 ml/m LA Vol (A4C):   48.1 ml  22.30 ml/m LA Biplane Vol: 78.1 ml  36.21 ml/m   AORTA Ao Root diam: 3.80 cm Ao Asc diam:  4.00 cm TRICUSPID VALVE TR Peak grad:   43.0 mmHg TR Vmax:        328.00 cm/s  SHUNTS Systemic Diam: 2.30 cm Rozann Lesches MD Electronically signed by Rozann Lesches MD Signature Date/Time: 01/28/2023/12:46:04 PM    Final    EEG adult  Result Date: 01/28/2023 Lora Havens, MD     01/28/2023 10:06 AM Patient Name: NIGUEL HARGAN MRN: [id number] Epilepsy Attending: Lora Havens Referring Physician/Provider: [name], MD Date: 01/28/2023 Duration: 26.39 mins Patient history: 89yo M with ams. EEG to evaluate for seizure Level of alertness: Awake AEDs during EEG study: Ativan Technical aspects: This EEG study was done with scalp electrodes positioned according to the 10-20 International system of electrode  placement. Electrical activity was reviewed with band pass filter of 1-70Hz , sensitivity of 7 uV/mm, display speed of 88mm/sec with a 60Hz  notched filter applied as appropriate. EEG data were recorded continuously and digitally stored.  Video monitoring was available and reviewed as appropriate. Description:  The posterior dominant rhythm consists of 8 Hz activity of moderate voltage (25-35 uV) seen predominantly in posterior head regions, symmetric and reactive to eye opening and eye closing. EEG showed continuous 3 to 6 Hz theta-delta slowing in left temporal region. Hyperventilation and photic stimulation were not performed.   ABNORMALITY - Continuous slow, left temporal region IMPRESSION: This study is suggestive of cortical dysfunction arising from left temporal region likely secondary to underlying structural abnormality. No seizures or epileptiform discharges were seen throughout the recording. Lora Havens   MR BRAIN WO CONTRAST  Result Date: 01/28/2023 CLINICAL DATA:  Follow-up examination for stroke. EXAM: MRI HEAD WITHOUT CONTRAST TECHNIQUE: Multiplanar, multiecho pulse sequences of the brain and surrounding structures were obtained without intravenous contrast. COMPARISON:  Prior CTs from 01/27/2023. FINDINGS: Brain: Examination is technically limited as the patient was unable to tolerate the full length of the study. Diffusion-weighted sequences and sagittal T1 weighted sequence only were performed. Age-related cerebral atrophy. Encephalomalacia at the anterior right frontal lobe consistent with a chronic right MCA territory infarct. Confluent restricted diffusion involving the left temporal lobe and temporal occipital region, consistent with an acute left MCA territory infarct (series 5, image 69). Mild patchy involvement of the left insular cortex. Additional small volume cortical involvement at the high posterior left parietal lobe noted (series 5, image 81). Additional restricted diffusion involving the superior left cerebellar hemisphere, consistent with an evolving left superior cerebellar artery infarct (series 5, image 60). Few additional punctate cortical infarcts noted at the right occipital lobe (series 5, images 64, 66). Additional punctate right cerebellar infarct (series 5, image 56).  Overall, given the various vascular distributions involved, a central thromboembolic etiology is suspected. No significant mass effect. Evaluation for associated blood products limited on this limited exam, however, there are suspected associated petechial blood products at the left temporal lobe (series 5, image 69). No associated hemorrhage visible on prior CT. No other acute or subacute ischemia. Gray-white matter differentiation otherwise grossly maintained. No visible mass lesion. No significant mass effect or midline shift. No hydrocephalus. No extra-axial fluid collection. Pituitary gland and suprasellar region grossly within normal limits. Vascular: Major intracranial vascular flow voids are not well assessed on this limited exam. Skull and upper cervical spine: Craniocervical junction grossly within normal limits. Bone marrow signal intensity within normal limits. No scalp soft tissue abnormality. Sinuses/Orbits: Globes orbital soft tissues demonstrate no obvious abnormality. Chronic paranasal sinus disease, better appreciated on prior CT. Other: None. IMPRESSION: 1. Technically limited and truncated exam due to the patient's inability to tolerate the full length of the study. 2. Moderate-sized acute left MCA territory infarct involving the left temporal lobe/temporoccipital region. Suspected associated petechial blood products at the left temporal lobe. 3. Additional acute left superior cerebellar artery infarct, with a few additional punctate infarcts involving the right occipital lobe and right cerebellum. Given the various vascular distributions involved, a central thromboembolic etiology is suspected. 4. Chronic right frontal lobe infarct, right MCA distribution. Electronically Signed   By: Jeannine Boga M.D.   On: 01/28/2023 03:52   CT ANGIO HEAD NECK W WO CM W PERF (CODE STROKE)  Result Date: 01/27/2023 CLINICAL DATA:  Stroke suspected EXAM: CT ANGIOGRAPHY HEAD AND NECK TECHNIQUE:  Multidetector CT imaging of the head and neck was performed using the standard protocol during bolus administration of intravenous contrast. Multiplanar CT image reconstructions and MIPs were obtained to evaluate the vascular anatomy. Carotid stenosis measurements (when applicable) are obtained utilizing NASCET criteria, using the distal internal carotid diameter as the denominator. RADIATION DOSE REDUCTION: This exam was performed according to the departmental dose-optimization program which includes automated exposure control, adjustment of the mA and/or kV according to patient size and/or use of iterative reconstruction technique. CONTRAST:  138mL OMNIPAQUE IOHEXOL 350 MG/ML SOLN COMPARISON:  No prior CTA available, correlation is made with 01/27/2023 CT head FINDINGS: Evaluation is significantly limited by motion artifact. CT HEAD FINDINGS For noncontrast findings, please see same day CT head. CTA NECK FINDINGS Aortic arch: Two-vessel arch with a common origin of the brachiocephalic and left common carotid arteries. Imaged portion shows no evidence of aneurysm or dissection. No significant stenosis of the major arch vessel origins. Aortic atherosclerosis. Right carotid system: No definite stenosis at the bifurcation or in the ICA. Left carotid system: No definite stenosis at the bifurcation or in the ICA. Vertebral arteries: Evaluation of the V1 and majority of the V2 segments is limited by motion. No significant stenosis in the distal V2 or V3 segment. Skeleton: No definite acute osseous abnormality, although evaluation is motion limited. Other neck: No acute finding. Upper chest: No focal pulmonary opacity or pleural effusion. Review of the MIP images confirms the above findings CTA HEAD FINDINGS Anterior circulation: Both internal carotid arteries are patent to the termini, with calcifications but without significant stenosis. A1 segments patent. Normal anterior communicating artery. Patent A 2 segments. More  distal ACA segments are unable to be evaluated due to motion. No M1 stenosis or occlusion. Proximal M2 segments are patent. Evaluation of more distal MCA branches is limited by motion, however there is a suggestion of slightly diminished opacification of the anterior left MCA branches compared to anterior right MCA branches (series 19, image 19). Posterior circulation: Vertebral arteries patent to the vertebrobasilar junction with mild stenosis in the mid to distal V4. Basilar patent to its distal aspect, with mild stenosis in the mid to distal basilar artery. Superior cerebellar arteries patent proximally. Patent P1 segments. Patent P2 segments. More distal PCA segment evaluation is limited by motion. Venous sinuses: As permitted by contrast timing, patent. Anatomic variants: None significant. Review of the MIP images confirms the above findings IMPRESSION: 1. Significantly motion degraded exam, with no definite proximal intracranial large vessel occlusion. There is a suggestion of slightly diminished opacification of the anterior left MCA branches compared to the anterior right MCA branches. 2. No hemodynamically significant stenosis in the neck. 3. Aortic atherosclerosis. Aortic Atherosclerosis (ICD10-I70.0). These findings were discussed by telephone on 01/27/2023 at 6:20 pm with provider ASHISH ARORA . Electronically Signed   By: Merilyn Baba M.D.   On: 01/27/2023 18:39   CT HEAD CODE STROKE WO CONTRAST  Result Date: 01/27/2023 CLINICAL DATA:  Code stroke.  Altered mental status EXAM: CT HEAD WITHOUT CONTRAST TECHNIQUE: Contiguous axial images were obtained from the base of the skull through the vertex without intravenous contrast. RADIATION DOSE REDUCTION: This exam was performed according to the departmental dose-optimization program which includes automated exposure control, adjustment of the mA and/or kV according to patient size and/or use of iterative reconstruction technique. COMPARISON:  None  Available. FINDINGS: Brain: Ill-defined hypodensity in the left superior cerebellum (series 6, image 55), which could indicate an acute or subacute infarct. Encephalomalacia  in the right frontal lobe is favored to represent sequela of a more remote infarct (series 4, image 15). No evidence of acute hemorrhage, mass, mass effect, or midline shift. No hydrocephalus or extra-axial collection. Vascular: No hyperdense vessel. Atherosclerotic calcifications in the intracranial carotid and vertebral arteries. Skull: Negative for fracture or focal lesion. Sinuses/Orbits: Mucosal thickening in the maxillary sinuses, frontal sinuses, and ethmoid air cells. Status post bilateral lens replacements. Other: The mastoid air cells are well aerated. ASPECTS ([place] Stroke Program Early CT Score) - Ganglionic level infarction (caudate, lentiform nuclei, internal capsule, insula, M1-M3 cortex): 7 - Supraganglionic infarction (M4-M6 cortex): 3 Total score (0-10 with 10 being normal): 10 IMPRESSION: 1. Ill-defined hypodensity in the left superior cerebellum, which could indicate acute to subacute infarct. 2. Encephalomalacia in the right frontal lobe favored to represent sequela of a more remote infarct. 3. No hemorrhage. Imaging results were communicated on 01/27/2023 at 5:47 pm to provider Dr. Rory Percy via secure text paging. Electronically Signed   By: Merilyn Baba M.D.   On: 01/27/2023 17:48      LOS: 0 days    Flora Lipps, MD [facility] Available via Epic secure chat 7am-7pm After these hours, please refer to coverage provider listed on [email or web address] 01/28/2023, 1:43 PM

## 2023-01-28 NOTE — Evaluation (Signed)
Physical Therapy Evaluation Patient Details Name: Juan [name] MRN: [id number] DOB: 12-28-1925 Today's Date: 01/28/2023  History of Present Illness  Juan [name] is a 89 y.o. male who presents on 3/22 with garbled speech, confusion, and agitation. 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 PMHx:  hyperlipidemia, prostate cancer, and paroxysmal atrial fibrillation not anticoagulated  Clinical Impression  Pt presents today after CVA with deficits in communication and balance. Pt with difficulty following verbal commands but following gestures intermittently. Pt was independent with all mobility at baseline, tolerating today's session fairly well, but HR noted to increase to 130s with ambulation, improving with standing or seated rest break. Pt with minG to minA for all mobility today due to mild imbalance with gait, no overt LOB today but path deviation and lateral sway noted, with daughter reporting gait not at his baseline. Acute PT will continue to follow up with pt as appropriate to progress mobility and balance, recommend AIR at discharge to maximize independence with all mobility. Acute PT will continue to follow as appropriate.        Recommendations for follow up therapy are one component of a multi-disciplinary discharge planning process, led by the attending physician.  Recommendations may be updated based on patient status, additional functional criteria and insurance authorization.  Follow Up Recommendations Acute inpatient rehab (3hours/day)      Assistance Recommended at Discharge Frequent or constant Supervision/Assistance  Patient can return home with the following  A little help with walking and/or transfers;Assist for transportation;Help with stairs or ramp for entrance    Equipment Recommendations Other (comment) (will continue to assess)  Recommendations for  Other Services  Rehab consult    Functional Status Assessment Patient has had a recent decline in their functional status and demonstrates the ability to make significant improvements in function in a reasonable and predictable amount of time.     Precautions / Restrictions Precautions Precautions: Fall Restrictions Weight Bearing Restrictions: No      Mobility  Bed Mobility Overal bed mobility: Needs Assistance Bed Mobility: Supine to Sit     Supine to sit: HOB elevated, Min guard     General bed mobility comments: increased time and gestures for performance, tactile cueing for bringing BLE to EOB and then pt able to sit up with minG for safety    Transfers Overall transfer level: Needs assistance Equipment used: None Transfers: Sit to/from Stand Sit to Stand: Min guard           General transfer comment: minG for safety and balance, attempting standing x2 trials before successful attempt, guarding for balance    Ambulation/Gait Ambulation/Gait assistance: Min assist Gait Distance (Feet): 75 Feet (with standing rest break) Assistive device: None Gait Pattern/deviations: Step-through pattern, Wide base of support, Drifts right/left, Decreased stride length, Trunk flexed Gait velocity: decreased     General Gait Details: wide BOS and increased lateral sway, minA provided for balance, no overt LOB but imbalance noted. Daughter reporting gait slower and unsteady compared to baseline. HR elevating to 130s with ambualtion, improving with standing and seated rest breaks  Stairs            Wheelchair Mobility    Modified Rankin (Stroke Patients Only) Modified Rankin (Stroke Patients Only) Pre-Morbid Rankin Score: No symptoms Modified Rankin: Moderately severe disability     Balance Overall balance assessment: Needs assistance Sitting-balance support: No upper extremity supported, Feet supported Sitting balance-Leahy  Scale: Good     Standing balance  support: No upper extremity supported, During functional activity Standing balance-Leahy Scale: Poor Standing balance comment: minA for balance provided due to imbalance noted                             Pertinent Vitals/Pain Pain Assessment Pain Assessment: No/denies pain    Home Living Family/patient expects to be discharged to:: Private residence Living Arrangements: Alone Available Help at Discharge: Family;Available PRN/intermittently Type of Home: House Home Access: Ramped entrance       Home Layout: One level Home Equipment: Other (comment);None Additional Comments: Pt has children who can assist intermittently, daughter at bedside reporting potential for pt to move in with her if needed    Prior Function Prior Level of Function : Independent/Modified Independent;Driving             Mobility Comments: independent with all mobility, no use of AD ADLs Comments: indep including IADLs     Hand Dominance   Dominant Hand: Right    Extremity/Trunk Assessment   Upper Extremity Assessment Upper Extremity Assessment: Defer to OT evaluation RUE Deficits / Details: used functionally for oral hygiene and LB ADLs - difficult to fully assess due to impiared cog/speech LUE Deficits / Details: used functionally for oral hygiene and LB ADLs - difficult to fully assess due to impiared cog/speech    Lower Extremity Assessment Lower Extremity Assessment: Overall WFL for tasks assessed (unable to perform formal MMT due to pt's cognition but lifting BLE against gravity, no overt difference noted)    Cervical / Trunk Assessment Cervical / Trunk Assessment: Kyphotic  Communication   Communication: Expressive difficulties  Cognition Arousal/Alertness: Awake/alert Behavior During Therapy: Impulsive, Restless Overall Cognitive Status: Difficult to assess Area of Impairment: Following commands                       Following Commands: Follows one step  commands inconsistently       General Comments: Difficulty following verbal commands but doing well with gestures. Pt intermittently asking if you want him to stand when gesturing but inconsistently. Unable to answer orientation questions but when pointing at guest in room he was able to correctly state it was his daughter.        General Comments General comments (skin integrity, edema, etc.): SPO2 stable on room air, HR elevating to 130s with ambulation, recovering with rest    Exercises     Assessment/Plan    PT Assessment Patient needs continued PT services  PT Problem List Decreased activity tolerance;Decreased balance;Decreased mobility;Decreased cognition;Decreased safety awareness;Decreased knowledge of precautions       PT Treatment Interventions DME instruction;Gait training;Functional mobility training;Therapeutic activities;Therapeutic exercise;Balance training;Neuromuscular re-education;Cognitive remediation;Patient/family education    PT Goals (Current goals can be found in the Care Plan section)  Acute Rehab PT Goals Patient Stated Goal: none stated by pt PT Goal Formulation: With family Time For Goal Achievement: 02/11/23 Potential to Achieve Goals: Good    Frequency Min 3X/week     Co-evaluation               AM-PAC PT "6 Clicks" Mobility  Outcome Measure Help needed turning from your back to your side while in a flat bed without using bedrails?: A Little Help needed moving from lying on your back to sitting on the side of a flat bed without using bedrails?: A Little Help needed moving to  and from a bed to a chair (including a wheelchair)?: A Little Help needed standing up from a chair using your arms (e.g., wheelchair or bedside chair)?: A Little Help needed to walk in hospital room?: A Little Help needed climbing 3-5 steps with a railing? : Total 6 Click Score: 16    End of Session Equipment Utilized During Treatment: Gait belt Activity  Tolerance: Patient tolerated treatment well Patient left: in chair;with call bell/phone within reach;with chair alarm set;with family/visitor present Nurse Communication: Mobility status PT Visit Diagnosis: Difficulty in walking, not elsewhere classified (R26.2);Unsteadiness on feet (R26.81)    Time: 1123-1140 PT Time Calculation (min) (ACUTE ONLY): 17 min   Charges:   PT Evaluation $PT Eval Moderate Complexity: 1 Mod          Charlynne Cousins, PT DPT Acute Rehabilitation Services Office [phone]   Luvenia Heller 01/28/2023, 2:05 PM

## 2023-01-28 NOTE — Progress Notes (Signed)
EEG complete - results pending 

## 2023-01-28 NOTE — Evaluation (Signed)
Occupational Therapy Evaluation Patient Details Name: Juan [name] MRN: [id number] DOB: Apr 06, 1926 Today's Date: 01/28/2023   History of Present Illness Juan [name] is a 89 y.o. male who presents with garbled speech, confusion, and agitation. 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 PMHx:  hyperlipidemia, prostate cancer, and paroxysmal atrial fibrillation not anticoagulated   Clinical Impression   Juan [name] was evaluated s/p the above admission list. He is indep and lives alone at baseline. Upon evaluation he was limited by impaired communication, decreased activity tolerance, unsteady gait and impaired cognition. Overall he required close min G for transfers and mobility. Maximal multimodal cues needed to initiate all tasks assessed. Pt perseverating on wanting to go home throghout, unable to accurate express any other needs or answer simple questions (name, dtr's name, yes/no, etc.) Due to the deficits listed below he also requires up to min A for ADLs. Pt will benefit from continued acute OT services. Recommend d/c to intense post acute rehab>3hr/day based in indep baseline.       Recommendations for follow up therapy are one component of a multi-disciplinary discharge planning process, led by the attending physician.  Recommendations may be updated based on patient status, additional functional criteria and insurance authorization.   Follow Up Recommendations  Acute inpatient rehab (3hours/day)     Assistance Recommended at Discharge Frequent or constant Supervision/Assistance  Patient can return home with the following A little help with walking and/or transfers;A little help with bathing/dressing/bathroom;Assistance with cooking/housework;Direct supervision/assist for medications management;Direct supervision/assist for financial management;Assist for transportation;Help  with stairs or ramp for entrance    Functional Status Assessment  Patient has had a recent decline in their functional status and demonstrates the ability to make significant improvements in function in a reasonable and predictable amount of time.  Equipment Recommendations  None recommended by OT    Recommendations for Other Services Rehab consult     Precautions / Restrictions Precautions Precautions: Fall Restrictions Weight Bearing Restrictions: No      Mobility Bed Mobility Overal bed mobility: Needs Assistance Bed Mobility: Supine to Sit, Sit to Supine     Supine to sit: Supervision Sit to supine: Supervision        Transfers Overall transfer level: Needs assistance Equipment used: None Transfers: Sit to/from Stand Sit to Stand: Min guard                  Balance Overall balance assessment: Needs assistance Sitting-balance support: Feet supported Sitting balance-Leahy Scale: Good     Standing balance support: No upper extremity supported, During functional activity Standing balance-Leahy Scale: Fair Standing balance comment: close min G                           ADL either performed or assessed with clinical judgement   ADL Overall ADL's : Needs assistance/impaired Eating/Feeding: Set up;Sitting   Grooming: Wash/dry hands;Wash/dry face;Oral care;Minimal assistance;Standing   Upper Body Bathing: Minimal assistance;Sitting   Lower Body Bathing: Minimal assistance;Sit to/from stand   Upper Body Dressing : Sitting;Set up;Min guard   Lower Body Dressing: Set up;Min guard;Sit to/from stand   Toilet Transfer: Minimal assistance;Ambulation   Toileting- Clothing Manipulation and Hygiene: Min guard;Sitting/lateral lean       Functional mobility during ADLs: Min guard General ADL Comments: visual and tactile cues needed to initiate all tasks     Vision Baseline Vision/History: 0  No visual deficits Vision Assessment?: No apparent  visual deficits     Perception Perception Perception Tested?: No   Praxis Praxis Praxis tested?: Not tested    Pertinent Vitals/Pain Pain Assessment Pain Assessment: No/denies pain     Hand Dominance Right   Extremity/Trunk Assessment Upper Extremity Assessment Upper Extremity Assessment: Overall WFL for tasks assessed;RUE deficits/detail;LUE deficits/detail RUE Deficits / Details: used functionally for oral hygiene and LB ADLs - difficult to fully assess due to impiared cog/speech LUE Deficits / Details: used functionally for oral hygiene and LB ADLs - difficult to fully assess due to impiared cog/speech   Lower Extremity Assessment Lower Extremity Assessment: Defer to PT evaluation   Cervical / Trunk Assessment Cervical / Trunk Assessment: Kyphotic (mild)   Communication Communication Communication: Expressive difficulties   Cognition Arousal/Alertness: Awake/alert Behavior During Therapy: Impulsive Overall Cognitive Status: Difficult to assess Area of Impairment: Following commands                       Following Commands: Follows one step commands inconsistently       General Comments: Unable to follow any verbal commands or answer orientation questions. Difficult to determine cognitive deficit vs. speech. Does better with gestures and initiating functional tasks with an item (ex. socks, oral hygiene)     General Comments  VSS on RA, dtr present    Exercises     Shoulder Instructions      Home Living Family/patient expects to be discharged to:: Private residence Living Arrangements: Alone Available Help at Discharge: Family;Available PRN/intermittently Type of Home: House Home Access: Ramped entrance     Home Layout: One level     Bathroom Shower/Tub: Occupational psychologist: Standard     Home Equipment: Other (comment);None          Prior Functioning/Environment Prior Level of Function : Independent/Modified  Independent;Driving             Mobility Comments: indep, no AD ADLs Comments: indep including IADLs        OT Problem List: Decreased activity tolerance;Impaired balance (sitting and/or standing);Decreased cognition;Decreased safety awareness;Decreased knowledge of use of DME or AE;Decreased knowledge of precautions      OT Treatment/Interventions: Self-care/ADL training;Therapeutic exercise;DME and/or AE instruction;Therapeutic activities;Patient/family education;Cognitive remediation/compensation;Balance training    OT Goals(Current goals can be found in the care plan section) Acute Rehab OT Goals Patient Stated Goal: to go home OT Goal Formulation: With patient Time For Goal Achievement: 02/11/23 Potential to Achieve Goals: Good ADL Goals Pt Will Perform Upper Body Dressing: with modified independence Pt Will Perform Lower Body Dressing: with modified independence;sit to/from stand Pt Will Transfer to Toilet: with modified independence;ambulating Additional ADL Goal #1: Pt will indep follow 1 step directions 100% of session  OT Frequency: Min 2X/week    Co-evaluation              AM-PAC OT "6 Clicks" Daily Activity     Outcome Measure Help from another person eating meals?: None Help from another person taking care of personal grooming?: A Little Help from another person toileting, which includes using toliet, bedpan, or urinal?: A Little Help from another person bathing (including washing, rinsing, drying)?: A Little Help from another person to put on and taking off regular upper body clothing?: A Little Help from another person to put on and taking off regular lower body clothing?: A Little 6 Click Score: 19   End of Session Equipment Utilized During Treatment:  Gait belt Nurse Communication: Mobility status  Activity Tolerance: Patient tolerated treatment well Patient left: in bed;with call bell/phone within reach;with bed alarm set;with family/visitor  present  OT Visit Diagnosis: Unsteadiness on feet (R26.81);Muscle weakness (generalized) (M62.81);Cognitive communication deficit (R41.841) Symptoms and signs involving cognitive functions: Cerebral infarction                Time: [id number] OT Time Calculation (min): 21 min Charges:  OT General Charges $OT Visit: 1 Visit OT Evaluation $OT Eval Moderate Complexity: 1 Mod  Shade Flood, OTR/L [place] [facility] [place] Communication Preferred   Elliot Cousin 01/28/2023, 11:56 AM

## 2023-01-28 NOTE — Procedures (Signed)
Patient Name: RESTON HOCKER  MRN: [id number]  Epilepsy Attending: Lora Havens  Referring Physician/Provider: [name], MD  Date: 01/28/2023 Duration: 26.39 mins  Patient history: 89yo M with ams. EEG to evaluate for seizure  Level of alertness: Awake  AEDs during EEG study: Ativan  Technical aspects: This EEG study was done with scalp electrodes positioned according to the 10-20 International system of electrode placement. Electrical activity was reviewed with band pass filter of 1-70Hz , sensitivity of 7 uV/mm, display speed of 54mm/sec with a 60Hz  notched filter applied as appropriate. EEG data were recorded continuously and digitally stored.  Video monitoring was available and reviewed as appropriate.  Description: The posterior dominant rhythm consists of 8 Hz activity of moderate voltage (25-35 uV) seen predominantly in posterior head regions, symmetric and reactive to eye opening and eye closing. EEG showed continuous 3 to 6 Hz theta-delta slowing in left temporal region. Hyperventilation and photic stimulation were not performed.     ABNORMALITY - Continuous slow, left temporal region  IMPRESSION: This study is suggestive of cortical dysfunction arising from left temporal region likely secondary to underlying structural abnormality. No seizures or epileptiform discharges were seen throughout the recording.  Iridian Reader Barbra Sarks

## 2023-01-28 NOTE — Progress Notes (Signed)
  Echocardiogram 2D Echocardiogram has been performed.  Juan [name] 01/28/2023, 12:46 PM

## 2023-01-28 NOTE — Progress Notes (Signed)
Inpatient Rehab Admissions Coordinator Note:   Per therapy patient was screened for CIR candidacy by Kino Dunsworth Danford Bad, CCC-SLP. Pt's insurance Medicaid [employer] does not cover CIR. TOC made aware.   Gayland Curry, [place], [place] Admissions Coordinator [phone] 01/28/23 4:43 PM

## 2023-01-28 NOTE — Progress Notes (Addendum)
STROKE TEAM PROGRESS NOTE   INTERVAL HISTORY His daughter is at the bedside.  Patient is awake alert sitting up in bed eating breakfast.  He is globally aphasic, at times able to mimic, no focal weakness EF with 55 to 60%.  EEG with continuous slowing and left temporal region Therapy recommendation is acute inpatient rehab MRI scan of the brain shows bilateral cerebellar and left MCA branch infarcts. Vitals:   01/28/23 0757 01/28/23 0800 01/28/23 1202 01/28/23 1317  BP: 102/83 (!) 155/92    Pulse: 87 91 95 (S) 94  Resp: 18   20  Temp: 97.8 F (36.6 C)  (!) 97.3 F (36.3 C)   TempSrc: Oral  Axillary   SpO2: 97% 95% 92% 94%  Weight:      Height:       CBC:  Recent Labs  Lab 01/27/23 1731 01/27/23 1738 01/28/23 0618  WBC 9.1  --  6.7  NEUTROABS 6.2  --   --   HGB 13.4 13.3 12.1*  HCT 38.9* 39.0 35.8*  MCV 105.1*  --  104.4*  PLT 252  --  [id number]   Basic Metabolic Panel:  Recent Labs  Lab 01/27/23 1731 01/27/23 1738 01/28/23 0618  NA 135 137 135  K 3.6 3.6 3.8  CL 106 105 102  CO2 16*  --  21*  GLUCOSE 126* 123* 88  BUN 19 20 13   CREATININE 1.15 1.20 1.14  CALCIUM 8.4*  --  8.2*   Lipid Panel:  Recent Labs  Lab 01/28/23 0618  CHOL 156  TRIG 64  HDL 53  CHOLHDL 2.9  VLDL 13  LDLCALC 90   HgbA1c: No results for input(s): "HGBA1C" in the last 168 hours. Urine Drug Screen:  Recent Labs  Lab 01/28/23 0150  LABOPIA NONE DETECTED  COCAINSCRNUR NONE DETECTED  LABBENZ POSITIVE*  AMPHETMU NONE DETECTED  THCU NONE DETECTED  LABBARB NONE DETECTED    Alcohol Level  Recent Labs  Lab 01/27/23 1731  ETH 12*    IMAGING past 24 hours ECHOCARDIOGRAM COMPLETE  Result Date: 01/28/2023    ECHOCARDIOGRAM REPORT   Patient Name:   Juan [name] Date of Exam: 01/28/2023 Medical Rec #:  [id number]         Height:       70.0 in Accession #:    [id number]        Weight:       216.0 lb Date of Birth:  10-13-1926        BSA:          2.157 m Patient Age:    89 years           BP:           145/83 mmHg Patient Gender: M                 HR:           89 bpm. Exam Location:  Inpatient Procedure: 2D Echo, Cardiac Doppler and Color Doppler Indications:    stroke  History:        Patient has no prior history of Echocardiogram examinations.                 Chronic kidney disease. Cancer; Arrythmias:Paroxysmal A-fib.  Sonographer:    Johny Chess RDCS Referring Phys: [id number] TIMOTHY S OPYD  Sonographer Comments: Image acquisition challenging due to uncooperative patient. IMPRESSIONS  1. Left ventricular ejection fraction, by estimation, is 55 to 60%. The left ventricle  has normal function. The left ventricle has no regional wall motion abnormalities. There is mild concentric left ventricular hypertrophy. Left ventricular diastolic parameters are indeterminate.  2. Right ventricular systolic function is normal. The right ventricular size is normal. There is moderately elevated pulmonary artery systolic pressure. The estimated right ventricular systolic pressure is 123XX123 mmHg.  3. Left atrial size was mildly dilated.  4. The mitral valve is grossly normal. Mild mitral valve regurgitation.  5. The aortic valve is tricuspid. There is mild calcification of the aortic valve. Aortic valve regurgitation is mild. Aortic valve sclerosis/calcification is present, without any evidence of aortic stenosis.  6. Aortic dilatation noted. There is mild dilatation of the ascending aorta, measuring 40 mm.  7. The inferior vena cava is dilated in size with <50% respiratory variability, suggesting right atrial pressure of 15 mmHg. Comparison(s): No prior Echocardiogram. FINDINGS  Left Ventricle: Left ventricular ejection fraction, by estimation, is 55 to 60%. The left ventricle has normal function. The left ventricle has no regional wall motion abnormalities. The left ventricular internal cavity size was normal in size. There is  mild concentric left ventricular hypertrophy. Left ventricular diastolic function  could not be evaluated due to atrial fibrillation. Left ventricular diastolic parameters are indeterminate. Right Ventricle: The right ventricular size is normal. No increase in right ventricular wall thickness. Right ventricular systolic function is normal. There is moderately elevated pulmonary artery systolic pressure. The tricuspid regurgitant velocity is 3.28 m/s, and with an assumed right atrial pressure of 15 mmHg, the estimated right ventricular systolic pressure is 123XX123 mmHg. Left Atrium: Left atrial size was mildly dilated. Right Atrium: Right atrial size was normal in size. Pericardium: There is no evidence of pericardial effusion. Presence of epicardial fat layer. Mitral Valve: The mitral valve is grossly normal. Mild mitral annular calcification. Mild mitral valve regurgitation. Tricuspid Valve: The tricuspid valve is grossly normal. Tricuspid valve regurgitation is mild. Aortic Valve: The aortic valve is tricuspid. There is mild calcification of the aortic valve. There is mild to moderate aortic valve annular calcification. Aortic valve regurgitation is mild. Aortic valve sclerosis/calcification is present, without any evidence of aortic stenosis. Pulmonic Valve: The pulmonic valve was grossly normal. Pulmonic valve regurgitation is trivial. Aorta: Aortic dilatation noted. There is mild dilatation of the ascending aorta, measuring 40 mm. Venous: The inferior vena cava is dilated in size with less than 50% respiratory variability, suggesting right atrial pressure of 15 mmHg. IAS/Shunts: No atrial level shunt detected by color flow Doppler.  LEFT VENTRICLE PLAX 2D LVIDd:         5.00 cm LVIDs:         3.50 cm LV PW:         1.10 cm LV IVS:        1.10 cm LVOT diam:     2.30 cm LVOT Area:     4.15 cm  RIGHT VENTRICLE          IVC RV Basal diam:  3.40 cm  IVC diam: 3.00 cm TAPSE (M-mode): 1.4 cm LEFT ATRIUM              Index        RIGHT ATRIUM           Index LA diam:        5.30 cm  2.46 cm/m   RA  Area:     23.80 cm LA Vol (A2C):   122.0 ml 56.56 ml/m  RA Volume:   73.10 ml  33.89 ml/m LA Vol (A4C):   48.1 ml  22.30 ml/m LA Biplane Vol: 78.1 ml  36.21 ml/m   AORTA Ao Root diam: 3.80 cm Ao Asc diam:  4.00 cm TRICUSPID VALVE TR Peak grad:   43.0 mmHg TR Vmax:        328.00 cm/s  SHUNTS Systemic Diam: 2.30 cm Rozann Lesches MD Electronically signed by Rozann Lesches MD Signature Date/Time: 01/28/2023/12:46:04 PM    Final    EEG adult  Result Date: 01/28/2023 Lora Havens, MD     01/28/2023 10:06 AM Patient Name: Juan [name] MRN: [id number] Epilepsy Attending: Lora Havens Referring Physician/Provider: [name], MD Date: 01/28/2023 Duration: 26.39 mins Patient history: 89yo M with ams. EEG to evaluate for seizure Level of alertness: Awake AEDs during EEG study: Ativan Technical aspects: This EEG study was done with scalp electrodes positioned according to the 10-20 International system of electrode placement. Electrical activity was reviewed with band pass filter of 1-70Hz , sensitivity of 7 uV/mm, display speed of 7mm/sec with a 60Hz  notched filter applied as appropriate. EEG data were recorded continuously and digitally stored.  Video monitoring was available and reviewed as appropriate. Description: The posterior dominant rhythm consists of 8 Hz activity of moderate voltage (25-35 uV) seen predominantly in posterior head regions, symmetric and reactive to eye opening and eye closing. EEG showed continuous 3 to 6 Hz theta-delta slowing in left temporal region. Hyperventilation and photic stimulation were not performed.   ABNORMALITY - Continuous slow, left temporal region IMPRESSION: This study is suggestive of cortical dysfunction arising from left temporal region likely secondary to underlying structural abnormality. No seizures or epileptiform discharges were seen throughout the recording. Lora Havens   MR BRAIN WO CONTRAST  Result Date: 01/28/2023 CLINICAL DATA:   Follow-up examination for stroke. EXAM: MRI HEAD WITHOUT CONTRAST TECHNIQUE: Multiplanar, multiecho pulse sequences of the brain and surrounding structures were obtained without intravenous contrast. COMPARISON:  Prior CTs from 01/27/2023. FINDINGS: Brain: Examination is technically limited as the patient was unable to tolerate the full length of the study. Diffusion-weighted sequences and sagittal T1 weighted sequence only were performed. Age-related cerebral atrophy. Encephalomalacia at the anterior right frontal lobe consistent with a chronic right MCA territory infarct. Confluent restricted diffusion involving the left temporal lobe and temporal occipital region, consistent with an acute left MCA territory infarct (series 5, image 69). Mild patchy involvement of the left insular cortex. Additional small volume cortical involvement at the high posterior left parietal lobe noted (series 5, image 81). Additional restricted diffusion involving the superior left cerebellar hemisphere, consistent with an evolving left superior cerebellar artery infarct (series 5, image 60). Few additional punctate cortical infarcts noted at the right occipital lobe (series 5, images 64, 66). Additional punctate right cerebellar infarct (series 5, image 56). Overall, given the various vascular distributions involved, a central thromboembolic etiology is suspected. No significant mass effect. Evaluation for associated blood products limited on this limited exam, however, there are suspected associated petechial blood products at the left temporal lobe (series 5, image 69). No associated hemorrhage visible on prior CT. No other acute or subacute ischemia. Gray-white matter differentiation otherwise grossly maintained. No visible mass lesion. No significant mass effect or midline shift. No hydrocephalus. No extra-axial fluid collection. Pituitary gland and suprasellar region grossly within normal limits. Vascular: Major intracranial  vascular flow voids are not well assessed on this limited exam. Skull and upper cervical spine: Craniocervical junction grossly within normal limits. Bone marrow signal  intensity within normal limits. No scalp soft tissue abnormality. Sinuses/Orbits: Globes orbital soft tissues demonstrate no obvious abnormality. Chronic paranasal sinus disease, better appreciated on prior CT. Other: None. IMPRESSION: 1. Technically limited and truncated exam due to the patient's inability to tolerate the full length of the study. 2. Moderate-sized acute left MCA territory infarct involving the left temporal lobe/temporoccipital region. Suspected associated petechial blood products at the left temporal lobe. 3. Additional acute left superior cerebellar artery infarct, with a few additional punctate infarcts involving the right occipital lobe and right cerebellum. Given the various vascular distributions involved, a central thromboembolic etiology is suspected. 4. Chronic right frontal lobe infarct, right MCA distribution. Electronically Signed   By: Jeannine Boga M.D.   On: 01/28/2023 03:52   CT ANGIO HEAD NECK W WO CM W PERF (CODE STROKE)  Result Date: 01/27/2023 CLINICAL DATA:  Stroke suspected EXAM: CT ANGIOGRAPHY HEAD AND NECK TECHNIQUE: Multidetector CT imaging of the head and neck was performed using the standard protocol during bolus administration of intravenous contrast. Multiplanar CT image reconstructions and MIPs were obtained to evaluate the vascular anatomy. Carotid stenosis measurements (when applicable) are obtained utilizing NASCET criteria, using the distal internal carotid diameter as the denominator. RADIATION DOSE REDUCTION: This exam was performed according to the departmental dose-optimization program which includes automated exposure control, adjustment of the mA and/or kV according to patient size and/or use of iterative reconstruction technique. CONTRAST:  156mL OMNIPAQUE IOHEXOL 350 MG/ML SOLN  COMPARISON:  No prior CTA available, correlation is made with 01/27/2023 CT head FINDINGS: Evaluation is significantly limited by motion artifact. CT HEAD FINDINGS For noncontrast findings, please see same day CT head. CTA NECK FINDINGS Aortic arch: Two-vessel arch with a common origin of the brachiocephalic and left common carotid arteries. Imaged portion shows no evidence of aneurysm or dissection. No significant stenosis of the major arch vessel origins. Aortic atherosclerosis. Right carotid system: No definite stenosis at the bifurcation or in the ICA. Left carotid system: No definite stenosis at the bifurcation or in the ICA. Vertebral arteries: Evaluation of the V1 and majority of the V2 segments is limited by motion. No significant stenosis in the distal V2 or V3 segment. Skeleton: No definite acute osseous abnormality, although evaluation is motion limited. Other neck: No acute finding. Upper chest: No focal pulmonary opacity or pleural effusion. Review of the MIP images confirms the above findings CTA HEAD FINDINGS Anterior circulation: Both internal carotid arteries are patent to the termini, with calcifications but without significant stenosis. A1 segments patent. Normal anterior communicating artery. Patent A 2 segments. More distal ACA segments are unable to be evaluated due to motion. No M1 stenosis or occlusion. Proximal M2 segments are patent. Evaluation of more distal MCA branches is limited by motion, however there is a suggestion of slightly diminished opacification of the anterior left MCA branches compared to anterior right MCA branches (series 19, image 19). Posterior circulation: Vertebral arteries patent to the vertebrobasilar junction with mild stenosis in the mid to distal V4. Basilar patent to its distal aspect, with mild stenosis in the mid to distal basilar artery. Superior cerebellar arteries patent proximally. Patent P1 segments. Patent P2 segments. More distal PCA segment evaluation  is limited by motion. Venous sinuses: As permitted by contrast timing, patent. Anatomic variants: None significant. Review of the MIP images confirms the above findings IMPRESSION: 1. Significantly motion degraded exam, with no definite proximal intracranial large vessel occlusion. There is a suggestion of slightly diminished opacification of the anterior  left MCA branches compared to the anterior right MCA branches. 2. No hemodynamically significant stenosis in the neck. 3. Aortic atherosclerosis. Aortic Atherosclerosis (ICD10-I70.0). These findings were discussed by telephone on 01/27/2023 at 6:20 pm with provider ASHISH ARORA . Electronically Signed   By: Merilyn Baba M.D.   On: 01/27/2023 18:39   CT HEAD CODE STROKE WO CONTRAST  Result Date: 01/27/2023 CLINICAL DATA:  Code stroke.  Altered mental status EXAM: CT HEAD WITHOUT CONTRAST TECHNIQUE: Contiguous axial images were obtained from the base of the skull through the vertex without intravenous contrast. RADIATION DOSE REDUCTION: This exam was performed according to the departmental dose-optimization program which includes automated exposure control, adjustment of the mA and/or kV according to patient size and/or use of iterative reconstruction technique. COMPARISON:  None Available. FINDINGS: Brain: Ill-defined hypodensity in the left superior cerebellum (series 6, image 55), which could indicate an acute or subacute infarct. Encephalomalacia in the right frontal lobe is favored to represent sequela of a more remote infarct (series 4, image 15). No evidence of acute hemorrhage, mass, mass effect, or midline shift. No hydrocephalus or extra-axial collection. Vascular: No hyperdense vessel. Atherosclerotic calcifications in the intracranial carotid and vertebral arteries. Skull: Negative for fracture or focal lesion. Sinuses/Orbits: Mucosal thickening in the maxillary sinuses, frontal sinuses, and ethmoid air cells. Status post bilateral lens replacements.  Other: The mastoid air cells are well aerated. ASPECTS ([place] Stroke Program Early CT Score) - Ganglionic level infarction (caudate, lentiform nuclei, internal capsule, insula, M1-M3 cortex): 7 - Supraganglionic infarction (M4-M6 cortex): 3 Total score (0-10 with 10 being normal): 10 IMPRESSION: 1. Ill-defined hypodensity in the left superior cerebellum, which could indicate acute to subacute infarct. 2. Encephalomalacia in the right frontal lobe favored to represent sequela of a more remote infarct. 3. No hemorrhage. Imaging results were communicated on 01/27/2023 at 5:47 pm to provider Dr. Rory Percy via secure text paging. Electronically Signed   By: Merilyn Baba M.D.   On: 01/27/2023 17:48    PHYSICAL EXAM  Temp:  [97.3 F (36.3 C)-99.3 F (37.4 C)] 97.3 F (36.3 C) (03/23 1202) Pulse Rate:  [77-104] 94 (03/23 1317) Resp:  [16-24] 20 (03/23 1317) BP: (102-164)/(76-115) 155/92 (03/23 0800) SpO2:  [92 %-100 %] 94 % (03/23 1317) Weight:  [98 kg] 98 kg (03/22 1846)  General - Well nourished, well developed, in no apparent distress. Cardiovascular - Regular rhythm and rate.  Mental Status -  He is awake alert eating breakfast.  He is globally aphasic, responds yes to questions.  Can intermittently follow commands/mimics examiner.  Cranial Nerves II - XII - II - Visual field intact OU. III, IV, VI - Extraocular movements intact. V - Facial sensation intact bilaterally. VII - Facial movement intact bilaterally. VIII - Hearing & vestibular intact bilaterally. X - Palate elevates symmetrically. XI - Chin turning & shoulder shrug intact bilaterally. XII - Tongue protrusion intact.  Motor Strength - The patient's strength was normal in all extremities and pronator drift was absent.  Bulk was normal and fasciculations were absent.   Motor Tone - Muscle tone was assessed at the neck and appendages and was normal. Sensory - Light touch, temperature/pinprick were assessed and were symmetrical.     Coordination -unable to assess due to aphasia  Gait and Station - deferred.  ASSESSMENT/PLAN Juan [name] is a 89 y.o. male with history of hyperlipidemia, prostate cancer, and paroxysmal atrial fibrillation not anticoagulated who presents with garbled speech, confusion, and agitation.   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 No seizures or epileptiform discharges were seen throughout the recording. LDL 90 HgbA1c pending VTE prophylaxis -Lovenox    Diet   Diet regular Fluid consistency: Thin   Aspirin 81 mg prior to admission, now on 81 mg daily.  Therapy recommendations: Acute inpatient rehab Disposition: Pending  Hypertension Home meds: Non stable Permissive hypertension (OK if < 220/120) but gradually normalize in 5-7 days Long-term BP goal normotensive  Hyperlipidemia Home meds: Pravastatin 40 LDL 90, goal < 70 Pravastatin changed to Crestor 20 mg Continue statin at discharge  Paroxysmal atrial fibrillation Not on anticoagulation due to low CHADS2 score On aspirin and resumed  Other Stroke Risk Factors Advanced Age >/= 56  Obesity, Body mass index is 31 kg/m., BMI >/= 30 associated with increased stroke risk, recommend weight loss, diet and exercise as appropriate   Other Active Problems CKD stage III  Hospital day # 0 Beulah Gandy DNP, ACNPC-AG  [facility]  STROKE MD NOTE :  I have personally obtained history,examined this patient, reviewed notes, independently viewed imaging studies, participated in medical decision making and plan of care.ROS completed by me personally and pertinent positives fully documented  I have made any additions or clarifications directly to the above note. Agree with note above.  Patient presented with aphasia secondary to left MCA and left cerebellar infarcts from A-fib not on anticoagulation.  Recommend anticoagulation with Eliquis and patient is able to swallow in 3 to 5 days.  Long discussion patient and daughter at the bedside and answered questions.  Continue ongoing speech therapy, physical and outpatient therapy.  Aggressive risk factor modification.  Likely transfer to rehabilitation in the next few days.  Greater than 50% time during this 50-minute visit was spent in counseling and coordination of care about his embolic stroke and atrial fibrillation and need for anticoagulation discussion with patient and care team and daughter and answered questions.  Antony Contras, MD Medical Director [facility] Pager: [phone] 01/28/2023 5:01 PM   To contact Stroke Continuity provider, please refer to [email or web address] After hours, contact General Neurology

## 2023-01-29 DIAGNOSIS — E785 Hyperlipidemia, unspecified: Secondary | ICD-10-CM | POA: Diagnosis not present

## 2023-01-29 DIAGNOSIS — I639 Cerebral infarction, unspecified: Secondary | ICD-10-CM | POA: Diagnosis not present

## 2023-01-29 DIAGNOSIS — E872 Acidosis, unspecified: Secondary | ICD-10-CM | POA: Diagnosis not present

## 2023-01-29 DIAGNOSIS — G9340 Encephalopathy, unspecified: Secondary | ICD-10-CM | POA: Diagnosis not present

## 2023-01-29 LAB — CBC
HCT: 37.1 % — ABNORMAL LOW (ref 39.0–52.0)
Hemoglobin: 12.4 g/dL — ABNORMAL LOW (ref 13.0–17.0)
MCH: 34.7 pg — ABNORMAL HIGH (ref 26.0–34.0)
MCHC: 33.4 g/dL (ref 30.0–36.0)
MCV: 103.9 fL — ABNORMAL HIGH (ref 80.0–100.0)
Platelets: 238 10*3/uL (ref 150–400)
RBC: 3.57 MIL/uL — ABNORMAL LOW (ref 4.22–5.81)
RDW: 12.8 % (ref 11.5–15.5)
WBC: 7.3 10*3/uL (ref 4.0–10.5)
nRBC: 0 % (ref 0.0–0.2)

## 2023-01-29 LAB — BASIC METABOLIC PANEL
Anion gap: 8 (ref 5–15)
BUN: 12 mg/dL (ref 8–23)
CO2: 22 mmol/L (ref 22–32)
Calcium: 8.5 mg/dL — ABNORMAL LOW (ref 8.9–10.3)
Chloride: 107 mmol/L (ref 98–111)
Creatinine, Ser: 1.17 mg/dL (ref 0.61–1.24)
GFR, Estimated: 58 mL/min — ABNORMAL LOW (ref >60)
Glucose, Bld: 99 mg/dL (ref 70–99)
Potassium: 3.8 mmol/L (ref 3.5–5.1)
Sodium: 137 mmol/L (ref 135–145)

## 2023-01-29 LAB — MAGNESIUM: Magnesium: 2.1 mg/dL (ref 1.7–2.4)

## 2023-01-29 MED ORDER — SALINE SPRAY 0.65 % NA SOLN
1.0000 | NASAL | Status: DC | PRN
  Administered 2023-01-29: 1 via NASAL
  Filled 2023-01-29: qty 44

## 2023-01-29 MED ORDER — QUETIAPINE FUMARATE 25 MG PO TABS
25.0000 mg | ORAL_TABLET | Freq: Every day | ORAL | Status: DC
  Administered 2023-01-29 – 2023-01-31 (×3): 25 mg via ORAL
  Filled 2023-01-29 (×3): qty 1

## 2023-01-29 NOTE — Progress Notes (Signed)
[facility] night cross cover note:   I was notified by RN of the patient's complaint of rhinitis/rhinorrhea and request for nasal spray.  I subsequent placed order for prn Ocean nasal spray for rhinitis/rhinorrhea.      Babs Bertin, DO Hospitalist

## 2023-01-29 NOTE — Evaluation (Signed)
Speech Language Pathology Evaluation Patient Details Name: Juan [name] MRN: [id number] DOB: [birth year] Today's Date: 01/29/2023 Time: 1550-1610 SLP Time Calculation (min) (ACUTE ONLY): 20 min  Problem List:  Patient Active Problem List   Diagnosis Date Noted   Acute CVA (cerebrovascular accident) ([place]) 01/28/2023   Acute encephalopathy 01/27/2023   Acidosis 01/27/2023   Stage 3a chronic kidney disease (CKD) ([place]) 01/27/2023   PAF (paroxysmal atrial fibrillation) ([place])    Hyperlipidemia    Past Medical History:  Past Medical History:  Diagnosis Date   Arrhythmia    Cancer ([place])    Hyperlipidemia    Prostate cancer ([place])    Shingles outbreak    LEFT CHEST WALL   Past Surgical History: History reviewed. No pertinent surgical history. HPI:  Patient is a 89 y.o. male with PMH: HLD, prostate cancer, paroxysmal afib. He presented to the hospital on 01/27/23 with acute onset garbled speech, confusion and agitation. When EMS arrived, patient was combative. In ED, patient was afebrile and saturating in mid 90's on RA with slightly elevated HR and stable BP. CT head showed ill-defined hypodensity in the left superior cerebellum and right frontal encephalomalacia. MRI brain showed Moderate-sized acute left MCA territory infarct involving the left temporal lobe/temporoccipital region.   Assessment / Plan / Recommendation Clinical Impression  Patient presents with a mixed receptive-expressive aphasia with expressive language being slightly better than receptive. Per daughter who was in the room, she noticed improvements in his ability to communicate, such as when he was able to name a couple items, tell SLP how he goes golfing on Thursdays and that he was able to describe a little of Cookie Theft picture (per daughter he was presented with this same picture yesterday and could not describe at all). Patient did named two of four common object pictures and was able to point to object picture in  field of 4 with 50% accuracy. He followed one step directions with 25% accuracy. When presented with pictures to point to or tasks to point to body parts, etc, he would perseverate on seeming to describe/name objects instead of following instructions. When doing so, pragmatic language and intonation were all appropriate, however he produced very few real words. He did not exhibit any awareness to his errors (ie, no signs of frustration or attempts to correct). When describing Cookie theft photo he said "a little girl stubblin", "and the Mom is with a lot of season". When telling SLP about his weekly routine, he was able to produce some clear words and short phrases such as "golf on Thursdays", "go to church" etc. His daughter was very pleased to hear him telling SLP this, and she verified that the information was correct. Patient appears to be able to perform familiar functional tasks without difficulty however his aphasia makes it difficult to fully assess his cognition. Per daughter, she would like him to be able to return to some form of independence and would like him to get intensive therapy. At baseline, patient was independent and active, driving, plaing golf, etc. SLP will continue to follow patient while in acute level of care and recommend intensity SLP intervention at next venue of care. Recommendation is for AIR but if he does not qualify for that, recommendation is for outpatient SLP therapy.    SLP Assessment  SLP Recommendation/Assessment: Patient needs continued Speech Lanaguage Pathology Services SLP Visit Diagnosis: Aphasia (R47.01)    Recommendations for follow up therapy are one component of a multi-disciplinary discharge planning process, led  by the attending physician.  Recommendations may be updated based on patient status, additional functional criteria and insurance authorization.    Follow Up Recommendations  Acute inpatient rehab (3hours/day)    Assistance Recommended at  Discharge  Intermittent Supervision/Assistance  Functional Status Assessment Patient has had a recent decline in their functional status and demonstrates the ability to make significant improvements in function in a reasonable and predictable amount of time.  Frequency and Duration min 2x/week  2 weeks      SLP Evaluation Cognition  Overall Cognitive Status: Difficult to assess Arousal/Alertness: Awake/alert Orientation Level: Oriented to person Comments: cognition difficult to assess secondary to patient's severe aphasia.       Comprehension  Auditory Comprehension Overall Auditory Comprehension: Impaired Commands: Impaired One Step Basic Commands: 25-49% accurate Conversation: Simple Interfering Components: Attention;Processing speed EffectiveTechniques: Extra processing time;Visual/Gestural cues;Slowed speech;Stressing words Reading Comprehension Reading Status: Not tested    Expression Expression Primary Mode of Expression: Verbal Verbal Expression Overall Verbal Expression: Impaired Initiation: No impairment Level of Generative/Spontaneous Verbalization: Word;Phrase Naming: Impairment Responsive: 0-25% accurate Confrontation: Impaired Convergent: Not tested Divergent: Not tested Verbal Errors: Phonemic paraphasias;Not aware of errors;Semantic paraphasias Pragmatics: No impairment Effective Techniques: Sentence completion;Phonemic cues;Semantic cues Non-Verbal Means of Communication: Not applicable Written Expression Dominant Hand: Right Written Expression: Not tested   Oral / Motor  Oral Motor/Sensory Function Overall Oral Motor/Sensory Function: Within functional limits Motor Speech Overall Motor Speech: Appears within functional limits for tasks assessed Respiration: Within functional limits Phonation: Normal Resonance: Within functional limits Articulation: Within functional limitis Intelligibility: Intelligible Motor Planning: Witnin functional limits             Sonia Baller, MA, CCC-SLP Speech Therapy

## 2023-01-29 NOTE — Progress Notes (Signed)
STROKE TEAM PROGRESS NOTE   INTERVAL HISTORY His [name] is at Juan bedside.  Patient is   sitting up in bed he seems to be speaking better though still has some comprehension difficulties with speech appears more fluent.  He seems to follow gestures more consistently Hemoglobin A1c is yet surprisingly pending. Vitals:   01/28/23 1517 01/29/23 0443 01/29/23 0900 01/29/23 1202  BP: 129/89 124/75 (!) 152/104 (!) 152/107  Pulse:  89 (!) 106 (!) 101  Resp: 19 20 18 16   Temp: 97.7 F (36.5 C) (!) 97.3 F (36.3 C) 98.1 F (36.7 C) 98.4 F (36.9 C)  TempSrc: Oral Oral Oral Oral  SpO2: 95%  90% 90%  Weight:      Height:       CBC:  Recent Labs  Lab 01/27/23 1731 01/27/23 1738 01/28/23 0618 01/29/23 0345  WBC 9.1  --  6.7 7.3  NEUTROABS 6.2  --   --   --   HGB 13.4   < > 12.1* 12.4*  HCT 38.9*   < > 35.8* 37.1*  MCV 105.1*  --  104.4* 103.9*  PLT 252  --  235 238   < > = values in this interval not displayed.   Basic Metabolic Panel:  Recent Labs  Lab 01/28/23 0618 01/29/23 0345  NA 135 137  K 3.8 3.8  CL 102 107  CO2 21* 22  GLUCOSE 88 99  BUN 13 12  CREATININE 1.14 1.17  CALCIUM 8.2* 8.5*  MG  --  2.1   Lipid Panel:  Recent Labs  Lab 01/28/23 0618  CHOL 156  TRIG 64  HDL 53  CHOLHDL 2.9  VLDL 13  LDLCALC 90   HgbA1c: No results for input(s): "HGBA1C" in Juan last 168 hours. Urine Drug Screen:  Recent Labs  Lab 01/28/23 0150  LABOPIA NONE DETECTED  COCAINSCRNUR NONE DETECTED  LABBENZ POSITIVE*  AMPHETMU NONE DETECTED  THCU NONE DETECTED  LABBARB NONE DETECTED    Alcohol Level  Recent Labs  Lab 01/27/23 1731  ETH 12*    IMAGING past 24 hours No results found.  PHYSICAL EXAM  Temp:  [97.3 F (36.3 C)-98.4 F (36.9 C)] 98.4 F (36.9 C) (03/24 1202) Pulse Rate:  [89-106] 101 (03/24 1202) Resp:  [16-20] 16 (03/24 1202) BP: (124-152)/(75-107) 152/107 (03/24 1202) SpO2:  [90 %] 90 % (03/24 1202)  General - Well nourished, well developed,  in no apparent distress. Cardiovascular - Regular rhythm and rate.  Mental Status -  He is awake alert   He is globally aphasic, responds yes to questions.  Speech is fluent.  Can intermittently follow commands/mimics examiner.  Cranial Nerves II - XII - II - Visual field intact OU. III, IV, VI - Extraocular movements intact. V - Facial sensation intact bilaterally. VII - Facial movement intact bilaterally. VIII - Hearing & vestibular intact bilaterally. X - Palate elevates symmetrically. XI - Chin turning & shoulder shrug intact bilaterally. XII - Tongue protrusion intact.  Motor Strength - Juan patient's strength was normal in all extremities and pronator drift was absent.  Bulk was normal and fasciculations were absent.   Motor Tone - Muscle tone was assessed at Juan neck and appendages and was normal. Sensory - Light touch, temperature/pinprick were assessed and were symmetrical.    Coordination -unable to assess due to aphasia  Gait and Station - deferred.  ASSESSMENT/PLAN Juan [name] is a 89 y.o. male with history of hyperlipidemia, prostate cancer, and paroxysmal  atrial fibrillation not anticoagulated who presents with garbled speech, confusion, and agitation.   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 No seizures or epileptiform discharges were seen throughout Juan recording. LDL 90 HgbA1c  pending VTE prophylaxis -Lovenox    Diet   Diet regular Fluid consistency: Thin   Aspirin 81 mg prior to admission, now on 81 mg daily.  Therapy recommendations: Acute inpatient rehab Disposition: Pending  Hypertension Home meds: Non stable Permissive hypertension (OK if < 220/120) but gradually normalize in 5-7 days Long-term BP goal normotensive  Hyperlipidemia Home meds: Pravastatin 40 LDL 90, goal < 70 Pravastatin changed to Crestor 20 mg Continue statin at discharge  Paroxysmal atrial fibrillation Not on anticoagulation due to low CHADS2 score On aspirin and resumed  Other Stroke Risk Factors Advanced Age >/= 59  Obesity, Body mass index is 31 kg/m., BMI >/= 30 associated with increased stroke risk, recommend weight loss, diet and exercise as appropriate   Other Active Problems CKD stage III  Hospital day # 1  Patient presented with aphasia secondary to left MCA and left cerebellar infarcts from A-fib not on anticoagulation.  Recommend anticoagulation with Eliquis and patient is able to swallow in 3 to 5 days.  Long discussion patient and [name] at Juan bedside and answered questions.  Continue ongoing speech therapy, physical and outpatient therapy.  Aggressive risk factor modification.  Likely transfer to rehabilitation in Juan next few days.   Stroke team will sign off.  Kindly call for questions  Antony Contras, MD Medical Director [place] Pager: [phone] 01/29/2023 3:22 PM   To contact Stroke Continuity provider, please refer to [email or web address] After hours, contact General Neurology

## 2023-01-29 NOTE — Progress Notes (Signed)
PROGRESS NOTE    Juan [name]  [id number] DOB: 1926/01/24 DOA: 01/27/2023 PCP: Pcp, No    Brief Narrative:  Juan [name] is a 89 y.o. male with past medical history of hyperlipidemia, prostate cancer and paroxysmal atrial fibrillation not on anticoagulation presented to the hospital with garbled speech, confusion and agitation.  EMS was called found the patient to be combative, and administered 400 mL of IV fluids and 5 mg Versed prior to arrival in the ED. in the ED patient was afebrile.  Labs showed bicarb of 16, MCV 105.1. Head CT reveals a ill-defined hypodensity in the left superior cerebellum and right frontal encephalomalacia.  Patient was seen by neurology in the ED and was admitted hospital for further evaluation and treatment.  Assessment/Plan    Garbled speech and confusion, dizziness, agitation.  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 Seen by neurology.  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  Possible acute/subacute.   EEG with cortical dysfunction but no obvious seizure-like activity.  Hemoglobin A1c pending.  2D echocardiogram with LV ejection fraction 55 to 60% with LVH.  Lipid panel within normal range.  Urine drug screen shows benzodiazepines.  Patient received Keppra load in the ED, aspirin, seizure precautions.  PT OT speech therapy consulted.  PT OT recommended CIR.  Paroxysmal atrial fibrillation  Patient saw cardiology in 2012 for paroxysmal anticoagulation and was not anticoagulated at that time due to low CHADS2 score.  On aspirin, Crestor at this time.  Plan is to transition to Eliquis when able to swallow.   Metabolic acidosis  Sodium bicarbonate of 16.  Bicarbonate today at 21.   CKD IIIa  Serum creatinine of 1.1.  Continue to monitor.    DVT prophylaxis: enoxaparin (LOVENOX) injection 40 mg Start: 01/27/23 2000   Code Status:     Code Status: Full Code  Disposition:  CIR as per PT OT  recommendation unlikely to be covered.  Might need skilled nursing facility placement..  Status is: Inpatient  The patient is inpatient because: Acute stroke, need for rehabilitation.   Family Communication:  Spoke with the patient's daughter at bedside and updated clinical condition of the patient on 01/29/2023.  Consultants:  Neurology  Procedures:  None  Antimicrobials:  None  Anti-infectives (From admission, onward)    None      Subjective: Today, patient was seen and examined at bedside.  Appears to okay but was a little agitated yesterday. Denies any nausea vomiting fever or chills.    Objective: Vitals:   01/28/23 1517 01/29/23 0443 01/29/23 0900 01/29/23 1202  BP: 129/89 124/75 (!) 152/104 (!) 152/107  Pulse:  89 (!) 106 (!) 101  Resp: 19 20 18 16   Temp: 97.7 F (36.5 C) (!) 97.3 F (36.3 C) 98.1 F (36.7 C) 98.4 F (36.9 C)  TempSrc: Oral Oral Oral Oral  SpO2: 95%  90% 90%  Weight:      Height:       No intake or output data in the 24 hours ending 01/29/23 1218  Filed Weights   01/27/23 1700 01/27/23 1846  Weight: 98 kg 98 kg    Physical Examination: Body mass index is 31 kg/m.   General: Patient built, not in obvious distress,  has aphasia,  HENT:   No scleral pallor or icterus noted. Oral mucosa is moist.  Chest:  Clear breath sounds.  Diminished breath sounds bilaterally. No crackles or wheezes.  CVS: S1 &S2  heard. No murmur.  Regular rate and rhythm. Abdomen: Soft, nontender, nondistended.  Bowel sounds are heard.   Extremities: No cyanosis, clubbing or edema.  Peripheral pulses are palpable. Psych: Alert, awake and aphasic, mildly Communicative, agitated and confused CNS:  No cranial nerve deficits.  Moves all extremities.   Skin: Warm and dry.  No rashes noted.  Data Reviewed:   CBC: Recent Labs  Lab 01/27/23 1731 01/27/23 1738 01/28/23 0618 01/29/23 0345  WBC 9.1  --  6.7 7.3  NEUTROABS 6.2  --   --   --   HGB 13.4 13.3 12.1*  12.4*  HCT 38.9* 39.0 35.8* 37.1*  MCV 105.1*  --  104.4* 103.9*  PLT 252  --  235 238     Basic Metabolic Panel: Recent Labs  Lab 01/27/23 1731 01/27/23 1738 01/28/23 0618 01/29/23 0345  NA 135 137 135 137  K 3.6 3.6 3.8 3.8  CL 106 105 102 107  CO2 16*  --  21* 22  GLUCOSE 126* 123* 88 99  BUN 19 20 13 12   CREATININE 1.15 1.20 1.14 1.17  CALCIUM 8.4*  --  8.2* 8.5*  MG  --   --   --  2.1     Liver Function Tests: Recent Labs  Lab 01/27/23 1731 01/28/23 0618  AST 24 20  ALT 21 19  ALKPHOS 74 64  BILITOT 0.8 0.7  PROT 6.2* 5.5*  ALBUMIN 3.4* 2.9*      Radiology Studies: ECHOCARDIOGRAM COMPLETE  Result Date: 01/28/2023    ECHOCARDIOGRAM REPORT   Patient Name:   Juan [name] Date of Exam: 01/28/2023 Medical Rec #:  [id number]         Height:       70.0 in Accession #:    [id number]        Weight:       216.0 lb Date of Birth:  Jul 22, 1926        BSA:          2.157 m Patient Age:    33 years          BP:           145/83 mmHg Patient Gender: M                 HR:           89 bpm. Exam Location:  Inpatient Procedure: 2D Echo, Cardiac Doppler and Color Doppler Indications:    stroke  History:        Patient has no prior history of Echocardiogram examinations.                 Chronic kidney disease. Cancer; Arrythmias:Paroxysmal A-fib.  Sonographer:    Johny Chess RDCS Referring Phys: [id number] TIMOTHY S OPYD  Sonographer Comments: Image acquisition challenging due to uncooperative patient. IMPRESSIONS  1. Left ventricular ejection fraction, by estimation, is 55 to 60%. The left ventricle has normal function. The left ventricle has no regional wall motion abnormalities. There is mild concentric left ventricular hypertrophy. Left ventricular diastolic parameters are indeterminate.  2. Right ventricular systolic function is normal. The right ventricular size is normal. There is moderately elevated pulmonary artery systolic pressure. The estimated right ventricular  systolic pressure is 123XX123 mmHg.  3. Left atrial size was mildly dilated.  4. The mitral valve is grossly normal. Mild mitral valve regurgitation.  5. The aortic valve is tricuspid. There is mild calcification of the aortic valve. Aortic valve regurgitation  is mild. Aortic valve sclerosis/calcification is present, without any evidence of aortic stenosis.  6. Aortic dilatation noted. There is mild dilatation of the ascending aorta, measuring 40 mm.  7. The inferior vena cava is dilated in size with <50% respiratory variability, suggesting right atrial pressure of 15 mmHg. Comparison(s): No prior Echocardiogram. FINDINGS  Left Ventricle: Left ventricular ejection fraction, by estimation, is 55 to 60%. The left ventricle has normal function. The left ventricle has no regional wall motion abnormalities. The left ventricular internal cavity size was normal in size. There is  mild concentric left ventricular hypertrophy. Left ventricular diastolic function could not be evaluated due to atrial fibrillation. Left ventricular diastolic parameters are indeterminate. Right Ventricle: The right ventricular size is normal. No increase in right ventricular wall thickness. Right ventricular systolic function is normal. There is moderately elevated pulmonary artery systolic pressure. The tricuspid regurgitant velocity is 3.28 m/s, and with an assumed right atrial pressure of 15 mmHg, the estimated right ventricular systolic pressure is 123XX123 mmHg. Left Atrium: Left atrial size was mildly dilated. Right Atrium: Right atrial size was normal in size. Pericardium: There is no evidence of pericardial effusion. Presence of epicardial fat layer. Mitral Valve: The mitral valve is grossly normal. Mild mitral annular calcification. Mild mitral valve regurgitation. Tricuspid Valve: The tricuspid valve is grossly normal. Tricuspid valve regurgitation is mild. Aortic Valve: The aortic valve is tricuspid. There is mild calcification of the aortic  valve. There is mild to moderate aortic valve annular calcification. Aortic valve regurgitation is mild. Aortic valve sclerosis/calcification is present, without any evidence of aortic stenosis. Pulmonic Valve: The pulmonic valve was grossly normal. Pulmonic valve regurgitation is trivial. Aorta: Aortic dilatation noted. There is mild dilatation of the ascending aorta, measuring 40 mm. Venous: The inferior vena cava is dilated in size with less than 50% respiratory variability, suggesting right atrial pressure of 15 mmHg. IAS/Shunts: No atrial level shunt detected by color flow Doppler.  LEFT VENTRICLE PLAX 2D LVIDd:         5.00 cm LVIDs:         3.50 cm LV PW:         1.10 cm LV IVS:        1.10 cm LVOT diam:     2.30 cm LVOT Area:     4.15 cm  RIGHT VENTRICLE          IVC RV Basal diam:  3.40 cm  IVC diam: 3.00 cm TAPSE (M-mode): 1.4 cm LEFT ATRIUM              Index        RIGHT ATRIUM           Index LA diam:        5.30 cm  2.46 cm/m   RA Area:     23.80 cm LA Vol (A2C):   122.0 ml 56.56 ml/m  RA Volume:   73.10 ml  33.89 ml/m LA Vol (A4C):   48.1 ml  22.30 ml/m LA Biplane Vol: 78.1 ml  36.21 ml/m   AORTA Ao Root diam: 3.80 cm Ao Asc diam:  4.00 cm TRICUSPID VALVE TR Peak grad:   43.0 mmHg TR Vmax:        328.00 cm/s  SHUNTS Systemic Diam: 2.30 cm Rozann Lesches MD Electronically signed by Rozann Lesches MD Signature Date/Time: 01/28/2023/12:46:04 PM    Final    EEG adult  Result Date: 01/28/2023 Lora Havens, MD     01/28/2023  10:06 AM Patient Name: Juan [name] MRN: [id number] Epilepsy Attending: Lora Havens Referring Physician/Provider: [name], MD Date: 01/28/2023 Duration: 26.39 mins Patient history: 89yo M with ams. EEG to evaluate for seizure Level of alertness: Awake AEDs during EEG study: Ativan Technical aspects: This EEG study was done with scalp electrodes positioned according to the 10-20 International system of electrode placement. Electrical activity was reviewed  with band pass filter of 1-70Hz , sensitivity of 7 uV/mm, display speed of 53mm/sec with a 60Hz  notched filter applied as appropriate. EEG data were recorded continuously and digitally stored.  Video monitoring was available and reviewed as appropriate. Description: The posterior dominant rhythm consists of 8 Hz activity of moderate voltage (25-35 uV) seen predominantly in posterior head regions, symmetric and reactive to eye opening and eye closing. EEG showed continuous 3 to 6 Hz theta-delta slowing in left temporal region. Hyperventilation and photic stimulation were not performed.   ABNORMALITY - Continuous slow, left temporal region IMPRESSION: This study is suggestive of cortical dysfunction arising from left temporal region likely secondary to underlying structural abnormality. No seizures or epileptiform discharges were seen throughout the recording. Lora Havens   MR BRAIN WO CONTRAST  Result Date: 01/28/2023 CLINICAL DATA:  Follow-up examination for stroke. EXAM: MRI HEAD WITHOUT CONTRAST TECHNIQUE: Multiplanar, multiecho pulse sequences of the brain and surrounding structures were obtained without intravenous contrast. COMPARISON:  Prior CTs from 01/27/2023. FINDINGS: Brain: Examination is technically limited as the patient was unable to tolerate the full length of the study. Diffusion-weighted sequences and sagittal T1 weighted sequence only were performed. Age-related cerebral atrophy. Encephalomalacia at the anterior right frontal lobe consistent with a chronic right MCA territory infarct. Confluent restricted diffusion involving the left temporal lobe and temporal occipital region, consistent with an acute left MCA territory infarct (series 5, image 69). Mild patchy involvement of the left insular cortex. Additional small volume cortical involvement at the high posterior left parietal lobe noted (series 5, image 81). Additional restricted diffusion involving the superior left cerebellar  hemisphere, consistent with an evolving left superior cerebellar artery infarct (series 5, image 60). Few additional punctate cortical infarcts noted at the right occipital lobe (series 5, images 64, 66). Additional punctate right cerebellar infarct (series 5, image 56). Overall, given the various vascular distributions involved, a central thromboembolic etiology is suspected. No significant mass effect. Evaluation for associated blood products limited on this limited exam, however, there are suspected associated petechial blood products at the left temporal lobe (series 5, image 69). No associated hemorrhage visible on prior CT. No other acute or subacute ischemia. Gray-white matter differentiation otherwise grossly maintained. No visible mass lesion. No significant mass effect or midline shift. No hydrocephalus. No extra-axial fluid collection. Pituitary gland and suprasellar region grossly within normal limits. Vascular: Major intracranial vascular flow voids are not well assessed on this limited exam. Skull and upper cervical spine: Craniocervical junction grossly within normal limits. Bone marrow signal intensity within normal limits. No scalp soft tissue abnormality. Sinuses/Orbits: Globes orbital soft tissues demonstrate no obvious abnormality. Chronic paranasal sinus disease, better appreciated on prior CT. Other: None. IMPRESSION: 1. Technically limited and truncated exam due to the patient's inability to tolerate the full length of the study. 2. Moderate-sized acute left MCA territory infarct involving the left temporal lobe/temporoccipital region. Suspected associated petechial blood products at the left temporal lobe. 3. Additional acute left superior cerebellar artery infarct, with a few additional punctate infarcts involving the right occipital lobe and right cerebellum. Given  the various vascular distributions involved, a central thromboembolic etiology is suspected. 4. Chronic right frontal lobe  infarct, right MCA distribution. Electronically Signed   By: Jeannine Boga M.D.   On: 01/28/2023 03:52   CT ANGIO HEAD NECK W WO CM W PERF (CODE STROKE)  Result Date: 01/27/2023 CLINICAL DATA:  Stroke suspected EXAM: CT ANGIOGRAPHY HEAD AND NECK TECHNIQUE: Multidetector CT imaging of the head and neck was performed using the standard protocol during bolus administration of intravenous contrast. Multiplanar CT image reconstructions and MIPs were obtained to evaluate the vascular anatomy. Carotid stenosis measurements (when applicable) are obtained utilizing NASCET criteria, using the distal internal carotid diameter as the denominator. RADIATION DOSE REDUCTION: This exam was performed according to the departmental dose-optimization program which includes automated exposure control, adjustment of the mA and/or kV according to patient size and/or use of iterative reconstruction technique. CONTRAST:  128mL OMNIPAQUE IOHEXOL 350 MG/ML SOLN COMPARISON:  No prior CTA available, correlation is made with 01/27/2023 CT head FINDINGS: Evaluation is significantly limited by motion artifact. CT HEAD FINDINGS For noncontrast findings, please see same day CT head. CTA NECK FINDINGS Aortic arch: Two-vessel arch with a common origin of the brachiocephalic and left common carotid arteries. Imaged portion shows no evidence of aneurysm or dissection. No significant stenosis of the major arch vessel origins. Aortic atherosclerosis. Right carotid system: No definite stenosis at the bifurcation or in the ICA. Left carotid system: No definite stenosis at the bifurcation or in the ICA. Vertebral arteries: Evaluation of the V1 and majority of the V2 segments is limited by motion. No significant stenosis in the distal V2 or V3 segment. Skeleton: No definite acute osseous abnormality, although evaluation is motion limited. Other neck: No acute finding. Upper chest: No focal pulmonary opacity or pleural effusion. Review of the MIP  images confirms the above findings CTA HEAD FINDINGS Anterior circulation: Both internal carotid arteries are patent to the termini, with calcifications but without significant stenosis. A1 segments patent. Normal anterior communicating artery. Patent A 2 segments. More distal ACA segments are unable to be evaluated due to motion. No M1 stenosis or occlusion. Proximal M2 segments are patent. Evaluation of more distal MCA branches is limited by motion, however there is a suggestion of slightly diminished opacification of the anterior left MCA branches compared to anterior right MCA branches (series 19, image 19). Posterior circulation: Vertebral arteries patent to the vertebrobasilar junction with mild stenosis in the mid to distal V4. Basilar patent to its distal aspect, with mild stenosis in the mid to distal basilar artery. Superior cerebellar arteries patent proximally. Patent P1 segments. Patent P2 segments. More distal PCA segment evaluation is limited by motion. Venous sinuses: As permitted by contrast timing, patent. Anatomic variants: None significant. Review of the MIP images confirms the above findings IMPRESSION: 1. Significantly motion degraded exam, with no definite proximal intracranial large vessel occlusion. There is a suggestion of slightly diminished opacification of the anterior left MCA branches compared to the anterior right MCA branches. 2. No hemodynamically significant stenosis in the neck. 3. Aortic atherosclerosis. Aortic Atherosclerosis (ICD10-I70.0). These findings were discussed by telephone on 01/27/2023 at 6:20 pm with provider ASHISH ARORA . Electronically Signed   By: Merilyn Baba M.D.   On: 01/27/2023 18:39   CT HEAD CODE STROKE WO CONTRAST  Result Date: 01/27/2023 CLINICAL DATA:  Code stroke.  Altered mental status EXAM: CT HEAD WITHOUT CONTRAST TECHNIQUE: Contiguous axial images were obtained from the base of the skull through the vertex  without intravenous contrast. RADIATION  DOSE REDUCTION: This exam was performed according to the departmental dose-optimization program which includes automated exposure control, adjustment of the mA and/or kV according to patient size and/or use of iterative reconstruction technique. COMPARISON:  None Available. FINDINGS: Brain: Ill-defined hypodensity in the left superior cerebellum (series 6, image 55), which could indicate an acute or subacute infarct. Encephalomalacia in the right frontal lobe is favored to represent sequela of a more remote infarct (series 4, image 15). No evidence of acute hemorrhage, mass, mass effect, or midline shift. No hydrocephalus or extra-axial collection. Vascular: No hyperdense vessel. Atherosclerotic calcifications in the intracranial carotid and vertebral arteries. Skull: Negative for fracture or focal lesion. Sinuses/Orbits: Mucosal thickening in the maxillary sinuses, frontal sinuses, and ethmoid air cells. Status post bilateral lens replacements. Other: The mastoid air cells are well aerated. ASPECTS ([place] Stroke Program Early CT Score) - Ganglionic level infarction (caudate, lentiform nuclei, internal capsule, insula, M1-M3 cortex): 7 - Supraganglionic infarction (M4-M6 cortex): 3 Total score (0-10 with 10 being normal): 10 IMPRESSION: 1. Ill-defined hypodensity in the left superior cerebellum, which could indicate acute to subacute infarct. 2. Encephalomalacia in the right frontal lobe favored to represent sequela of a more remote infarct. 3. No hemorrhage. Imaging results were communicated on 01/27/2023 at 5:47 pm to provider Dr. Rory Percy via secure text paging. Electronically Signed   By: Merilyn Baba M.D.   On: 01/27/2023 17:48      LOS: 1 day    Flora Lipps, MD [facility] Available via Epic secure chat 7am-7pm After these hours, please refer to coverage provider listed on [email or web address] 01/29/2023, 12:18 PM

## 2023-01-30 ENCOUNTER — Other Ambulatory Visit (HOSPITAL_COMMUNITY): Payer: Self-pay

## 2023-01-30 DIAGNOSIS — I639 Cerebral infarction, unspecified: Secondary | ICD-10-CM | POA: Diagnosis not present

## 2023-01-30 DIAGNOSIS — G9340 Encephalopathy, unspecified: Secondary | ICD-10-CM | POA: Diagnosis not present

## 2023-01-30 DIAGNOSIS — E872 Acidosis, unspecified: Secondary | ICD-10-CM | POA: Diagnosis not present

## 2023-01-30 DIAGNOSIS — E785 Hyperlipidemia, unspecified: Secondary | ICD-10-CM | POA: Diagnosis not present

## 2023-01-30 LAB — CBC
HCT: 36.9 % — ABNORMAL LOW (ref 39.0–52.0)
Hemoglobin: 12.8 g/dL — ABNORMAL LOW (ref 13.0–17.0)
MCH: 35.6 pg — ABNORMAL HIGH (ref 26.0–34.0)
MCHC: 34.7 g/dL (ref 30.0–36.0)
MCV: 102.5 fL — ABNORMAL HIGH (ref 80.0–100.0)
Platelets: 240 10*3/uL (ref 150–400)
RBC: 3.6 MIL/uL — ABNORMAL LOW (ref 4.22–5.81)
RDW: 13 % (ref 11.5–15.5)
WBC: 8.6 10*3/uL (ref 4.0–10.5)
nRBC: 0 % (ref 0.0–0.2)

## 2023-01-30 LAB — BASIC METABOLIC PANEL
Anion gap: 8 (ref 5–15)
BUN: 12 mg/dL (ref 8–23)
CO2: 25 mmol/L (ref 22–32)
Calcium: 8.7 mg/dL — ABNORMAL LOW (ref 8.9–10.3)
Chloride: 106 mmol/L (ref 98–111)
Creatinine, Ser: 1.22 mg/dL (ref 0.61–1.24)
GFR, Estimated: 55 mL/min — ABNORMAL LOW (ref >60)
Glucose, Bld: 98 mg/dL (ref 70–99)
Potassium: 4.2 mmol/L (ref 3.5–5.1)
Sodium: 139 mmol/L (ref 135–145)

## 2023-01-30 LAB — MAGNESIUM: Magnesium: 2 mg/dL (ref 1.7–2.4)

## 2023-01-30 LAB — HEMOGLOBIN A1C
Hgb A1c MFr Bld: 5.9 % — ABNORMAL HIGH (ref 4.8–5.6)
Mean Plasma Glucose: 123 mg/dL

## 2023-01-30 MED ORDER — APIXABAN 5 MG PO TABS
5.0000 mg | ORAL_TABLET | Freq: Two times a day (BID) | ORAL | Status: DC
  Administered 2023-01-30 – 2023-02-01 (×5): 5 mg via ORAL
  Filled 2023-01-30 (×5): qty 1

## 2023-01-30 MED ORDER — FLUTICASONE PROPIONATE 50 MCG/ACT NA SUSP
2.0000 | Freq: Every day | NASAL | Status: DC
  Administered 2023-01-30 – 2023-01-31 (×2): 2 via NASAL
  Filled 2023-01-30: qty 16

## 2023-01-30 NOTE — Progress Notes (Signed)
Inpatient Rehab Coordinator Note:  I met with patient and his daughter in law, Sharyn Lull, at bedside to discuss CIR recommendations and goals/expectations of CIR stay.  We reviewed 3 hrs/day of therapy, physician follow up, and average length of stay 2 weeks (dependent upon progress) with goals of supervision for cognitive assist.  Sharyn Lull unsure what kind of support family could provide, and asked that I contacted pt's daughter, Orene Desanctis, which I will try to do today.  Will follow.    Shann Medal, PT, DPT Admissions Coordinator [phone] 01/30/23  3:38 PM

## 2023-01-30 NOTE — Progress Notes (Signed)
Occupational Therapy Treatment Patient Details Name: ROALD WOLLAM MRN: [id number] DOB: 1926/10/28 Today's Date: 01/30/2023   History of present illness SAMY MCGRANN is a 89 y.o. male who presents on 3/22 with garbled speech, confusion, and agitation. 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 PMHx:  hyperlipidemia, prostate cancer, and paroxysmal atrial fibrillation not anticoagulated   OT comments  Pt ambulated to bathroom and stood to urinate with min guard assist. Stood at sink for 2 grooming activities with supervision. Pt able to don pajama pants with supervision. Per step daughter, pt has a sleeve he wears on his L knee which is not here and may contribute to his tendency to reach for furniture with ambulation. Pt following commands with delay and multimodal cues, inconsistently. Pt was highly independent at baseline: drives, golfs weekly, goes to [employer] for activities weekly. Continue to recommend intensive rehab.    Recommendations for follow up therapy are one component of a multi-disciplinary discharge planning process, led by the attending physician.  Recommendations may be updated based on patient status, additional functional criteria and insurance authorization.    Assistance Recommended at Discharge Frequent or constant Supervision/Assistance  Patient can return home with the following  A little help with walking and/or transfers;A little help with bathing/dressing/bathroom;Assistance with cooking/housework;Direct supervision/assist for medications management;Direct supervision/assist for financial management;Assist for transportation;Help with stairs or ramp for entrance   Equipment Recommendations  None recommended by OT    Recommendations for Other Services Rehab consult    Precautions / Restrictions Precautions Precautions: Fall Restrictions Weight  Bearing Restrictions: No       Mobility Bed Mobility               General bed mobility comments: pt OOB on arrival    Transfers Overall transfer level: Needs assistance Equipment used: None Transfers: Sit to/from Stand Sit to Stand: Supervision                 Balance Overall balance assessment: Needs assistance   Sitting balance-Leahy Scale: Good     Standing balance support: No upper extremity supported, During functional activity Standing balance-Leahy Scale: Fair Standing balance comment: min guard no LOB, reaches for furniture when available                           ADL either performed or assessed with clinical judgement   ADL Overall ADL's : Needs assistance/impaired     Grooming: Oral care;Standing;Supervision/safety;Wash/dry hands Grooming Details (indicate cue type and reason): thorough, sequenced independently             Lower Body Dressing: Set up;Sitting/lateral leans   Toilet Transfer: Min guard;Ambulation   Toileting- Clothing Manipulation and Hygiene: Supervision/safety       Functional mobility during ADLs: Min guard      Extremity/Trunk Assessment              Vision       Perception     Praxis      Cognition Arousal/Alertness: Awake/alert Behavior During Therapy: Impulsive, Restless Overall Cognitive Status: Difficult to assess Area of Impairment: Following commands                       Following Commands:  (delayed response speed, multimodal cues)                Exercises  Shoulder Instructions       General Comments Step daughter participated in session.     Pertinent Vitals/ Pain       Pain Assessment Pain Assessment: Faces Faces Pain Scale: Hurts a little bit Pain Location: L knee Pain Descriptors / Indicators: Discomfort Pain Intervention(s): Monitored during session, Repositioned  Home Living                                           Prior Functioning/Environment              Frequency  Min 2X/week        Progress Toward Goals  OT Goals(current goals can now be found in the care plan section)  Progress towards OT goals: Progressing toward goals  Acute Rehab OT Goals OT Goal Formulation: With patient Time For Goal Achievement: 02/11/23 Potential to Achieve Goals: Good  Plan Discharge plan remains appropriate    Co-evaluation                 AM-PAC OT "6 Clicks" Daily Activity     Outcome Measure   Help from another person eating meals?: None Help from another person taking care of personal grooming?: A Little Help from another person toileting, which includes using toliet, bedpan, or urinal?: A Little Help from another person bathing (including washing, rinsing, drying)?: A Little Help from another person to put on and taking off regular upper body clothing?: None Help from another person to put on and taking off regular lower body clothing?: A Little 6 Click Score: 20    End of Session Equipment Utilized During Treatment: Gait belt  OT Visit Diagnosis: Unsteadiness on feet (R26.81);Muscle weakness (generalized) (M62.81);Cognitive communication deficit (R41.841) Symptoms and signs involving cognitive functions: Cerebral infarction   Activity Tolerance Patient tolerated treatment well   Patient Left with family/visitor present;in chair;with call bell/phone within reach   Nurse Communication          Time: [id number] OT Time Calculation (min): 31 min  Charges: OT General Charges $OT Visit: 1 Visit OT Treatments $Self Care/Home Management : 23-37 mins  Cleta Alberts, OTR/L Acute Rehabilitation Services Office: [phone]  Malka So 01/30/2023, 2:29 PM

## 2023-01-30 NOTE — Progress Notes (Signed)
Physical Therapy Treatment Patient Details Name: Juan [name] MRN: [id number] DOB: 09/28/1926 Today's Date: 01/30/2023   History of Present Illness Juan [name] is a 89 y.o. male who presents on 3/22 with garbled speech, confusion, and agitation. 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 PMHx:  hyperlipidemia, prostate cancer, and paroxysmal atrial fibrillation not anticoagulated    PT Comments    Pt with continued progress towards acute goals this date with session focused on gait and therex for improved balance/postural reactions, increased LE strength and activity tolerance and command/direction following. Pt able to follow a few verbal only commands this session with pt continuing to benefit from gestures along with verbal directions. Pt continues to require up to min A during gait to steady as pt with noted instability but without LOB, however pt declining use of AD. Current plan remains appropriate to address deficits and maximize functional independence and decrease caregiver burden. Pt continues to benefit from skilled PT services to progress toward functional mobility goals.    Recommendations for follow up therapy are one component of a multi-disciplinary discharge planning process, led by the attending physician.  Recommendations may be updated based on patient status, additional functional criteria and insurance authorization.  Follow Up Recommendations       Assistance Recommended at Discharge Frequent or constant Supervision/Assistance  Patient can return home with the following A little help with walking and/or transfers;Assist for transportation;Help with stairs or ramp for entrance   Equipment Recommendations  Other (comment) (will continue to assess)    Recommendations for Other Services       Precautions / Restrictions Precautions Precautions:  Fall Restrictions Weight Bearing Restrictions: No     Mobility  Bed Mobility Overal bed mobility: Needs Assistance             General bed mobility comments: pt OOB on arrival    Transfers Overall transfer level: Needs assistance Equipment used: None Transfers: Sit to/from Stand Sit to Stand: Min guard           General transfer comment: minG for safety and balance, able to complete x5 with gestures    Ambulation/Gait Ambulation/Gait assistance: Min assist Gait Distance (Feet): 100 Feet Assistive device: None Gait Pattern/deviations: Step-through pattern, Wide base of support, Drifts right/left, Decreased stride length, Trunk flexed Gait velocity: decreased     General Gait Details: wide BOS and increased lateral sway, minA provided for balance, no overt LOB but imbalance noted with pt reaching for support of furniture in room intermittently. Daughter reporting gait slower and more unsteady compared to baseline, and reporting pt declining DME use   Stairs             Wheelchair Mobility    Modified Rankin (Stroke Patients Only) Modified Rankin (Stroke Patients Only) Pre-Morbid Rankin Score: No symptoms Modified Rankin: Moderately severe disability     Balance Overall balance assessment: Needs assistance Sitting-balance support: No upper extremity supported, Feet supported Sitting balance-Leahy Scale: Good     Standing balance support: No upper extremity supported, During functional activity Standing balance-Leahy Scale: Poor Standing balance comment: minA for balance provided due to imbalance noted during dynamic tasks                            Cognition Arousal/Alertness: Awake/alert Behavior During Therapy: Impulsive, Restless Overall Cognitive Status: Difficult to assess Area of Impairment: Following commands  Following Commands: Follows one step commands inconsistently       General Comments:  Difficulty following verbal commands but doing well with gestures. Pt intermittently asking if you want him to stand when gesturing but inconsistently. unable to answer orientation questions but able to repeat name        Exercises General Exercises - Lower Extremity Hip Flexion/Marching: Right, Left, 20 reps, Seated    General Comments General comments (skin integrity, edema, etc.): VSS on RA, daughter present and supportive throughout session, pt continues to be most limited by communication/global aphasia      Pertinent Vitals/Pain Pain Assessment Pain Assessment: Faces Faces Pain Scale: Hurts a little bit Pain Location: R knee Pain Descriptors / Indicators: Guarding, Grimacing Pain Intervention(s): Monitored during session, Limited activity within patient's tolerance    Home Living                          Prior Function            PT Goals (current goals can now be found in the care plan section) Acute Rehab PT Goals PT Goal Formulation: With family Time For Goal Achievement: 02/11/23 Progress towards PT goals: Progressing toward goals    Frequency    Min 3X/week      PT Plan      Co-evaluation              AM-PAC PT "6 Clicks" Mobility   Outcome Measure  Help needed turning from your back to your side while in a flat bed without using bedrails?: A Little Help needed moving from lying on your back to sitting on the side of a flat bed without using bedrails?: A Little Help needed moving to and from a bed to a chair (including a wheelchair)?: A Little Help needed standing up from a chair using your arms (e.g., wheelchair or bedside chair)?: A Little Help needed to walk in hospital room?: A Little Help needed climbing 3-5 steps with a railing? : Total 6 Click Score: 16    End of Session   Activity Tolerance: Patient tolerated treatment well Patient left: in chair;with call bell/phone within reach;with chair alarm set;with family/visitor  present Nurse Communication: Mobility status PT Visit Diagnosis: Difficulty in walking, not elsewhere classified (R26.2);Unsteadiness on feet (R26.81)     Time: [id number] PT Time Calculation (min) (ACUTE ONLY): 13 min  Charges:  $Therapeutic Activity: 8-22 mins                     Iam Lipson R. PTA Acute Rehabilitation Services Office: [place] 01/30/2023, 12:32 PM

## 2023-01-30 NOTE — Progress Notes (Signed)
  Transition of Care [facility]) Screening Note   Patient Details  Name: Juan [name] Date of Birth: [birth year]   Transition of Care [facility]) CM/SW Contact:    Benard Halsted, LCSW Phone Number: 01/30/2023, 8:58 AM    Transition of Care Department [facility]) has reviewed patient and CIR recommended at this time. CSW clarifying patient's insurance with CIR before completing SNF workup. We will continue to monitor patient advancement through interdisciplinary progression rounds. If new patient transition needs arise, please place a TOC consult.

## 2023-01-30 NOTE — Discharge Instructions (Signed)

## 2023-01-30 NOTE — Progress Notes (Signed)
Inpatient Rehab Admissions Coordinator:   Note insurance updated to [facility]. Will place consult for assessment.   Shann Medal, PT, DPT Admissions Coordinator [phone] 01/30/23  9:03 AM

## 2023-01-30 NOTE — TOC Benefit Eligibility Note (Signed)
Patient Teacher, English as a foreign language completed.    The patient is currently admitted and upon discharge could be taking Eliquis.  The current 30 day co-pay is $11.20.   The patient is insured through [employer] Part D   This test claim was processed through [place] amounts may vary at other pharmacies due to pharmacy/plan contracts, or as the patient moves through the different stages of their insurance plan.

## 2023-01-30 NOTE — Progress Notes (Signed)
ANTICOAGULATION CONSULT NOTE - Initial Consult  Pharmacy Consult for Apixaban Indication: atrial fibrillation  No Known Allergies  Patient Measurements: Height: 5\' 10"  (177.8 cm) Weight: 98 kg (216 lb 0.8 oz) IBW/kg (Calculated) : 73   Vital Signs: Temp: 98.1 F (36.7 C) (03/25 0443) Temp Source: Oral (03/25 0443) BP: 139/100 (03/25 0443)  Labs: Recent Labs    01/27/23 1731 01/27/23 1738 01/28/23 0618 01/29/23 0345 01/30/23 0747  HGB 13.4 13.3 12.1* 12.4* 12.8*  HCT 38.9* 39.0 35.8* 37.1* 36.9*  PLT 252  --  235 238 240  APTT 30  --   --   --   --   LABPROT 14.5  --   --   --   --   INR 1.1  --   --   --   --   CREATININE 1.15 1.20 1.14 1.17  --     Estimated Creatinine Clearance: 45.3 mL/min (by C-G formula based on SCr of 1.17 mg/dL).   Medical History: Past Medical History:  Diagnosis Date   Arrhythmia    Cancer ([place])    Hyperlipidemia    Prostate cancer ([place])    Shingles outbreak    LEFT CHEST WALL    Assessment: Patient admitted for stroke with PAF not on AC previously due to low CHADS2VASC score. MD wishes to start apixaban at this time. Age >80, Scr >1.5, Weight >60kg. Qualifies for full dose apixaban. Lovenox d/c'd by MD (last dose last PM)  Goal of Therapy:  Prevention of stroke Monitor platelets by anticoagulation protocol: Yes   Plan:  Apixaban 5mg  PO BID Monitor weight and renal function  Male Iafrate A. Levada Dy, PharmD, BCPS, FNKF Clinical Pharmacist  Please utilize [employer] for appropriate phone number to reach the unit pharmacist ([place])  01/30/2023,8:20 AM

## 2023-01-30 NOTE — Progress Notes (Signed)
PROGRESS NOTE    Juan [name]  [id number] DOB: 1926-06-07 DOA: 01/27/2023 PCP: Pcp, No    Brief Narrative:  Juan [name] is a 89 y.o. male with past medical history of hyperlipidemia, prostate cancer and paroxysmal atrial fibrillation not on anticoagulation presented to the hospital with garbled speech, confusion and agitation.  EMS was called found the patient to be combative, and administered 400 mL of IV fluids and 5 mg Versed prior to arrival in the ED.  In the ED, patient was afebrile.  Labs showed bicarb of 16, MCV 105.1. Head CT reveals a ill-defined hypodensity in the left superior cerebellum and right frontal encephalomalacia.  Patient was seen by neurology in the ED and was admitted to the hospital for further evaluation and treatment.    At this time patient has been seen by neurology and physical therapy.  Awaiting for CIR placement.  Assessment/Plan     Acute CVA with confusion agitation.. Patient initially had garbled speech, confusion, agitation on presentation.  Seen by neurology.  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  Neurology has been consulted at this time and patient has been started on Eliquis.    EEG with cortical dysfunction but no obvious seizure-like activity.  Hemoglobin A1c at 5.9.  2D echocardiogram with LV ejection fraction 55 to 60% with LVH.  Lipid panel within normal range.  Urine drug screen shows benzodiazepines.  PT OT recommended CIR. awaiting for CIR placement.  Confusion agitation has improved.  Patient is more alert awake and Communicative.  Patient was empirically started on low-dose Seroquel since 01/29/2023.  As needed Haldol as well and needed last night.  Paroxysmal atrial fibrillation  Patient saw cardiology in 2012 for paroxysmal anticoagulation and was not anticoagulated at that time due to low CHADS2 score.  On Eliquis, Crestor at this time.  Neurology on board.   CKD  IIIa with metabolic acidosis. Serum creatinine of 1.1.  Continue to monitor.  Acidosis has improved.  Sinus congestion.  Will add Flonase spray.  Debility weakness deconditioning.  Plan for CIR at this time.   DVT prophylaxis:  apixaban (ELIQUIS) tablet 5 mg   Code Status:     Code Status: Full Code  Disposition:  PT OT has recommended CIR.  Status is: Inpatient  The patient is inpatient because: Acute stroke, need for rehabilitation.   Family Communication:  I again spoke with the patient's daughter at bedside and updated clinical condition of the patient   Consultants:  Neurology  Procedures:  None  Antimicrobials:  None  Anti-infectives (From admission, onward)    None      Subjective: Today, patient was seen and examined at bedside.  Appears to be more alert awake and Communicative.  Understanding better and was able to walk a few times yesterday.  Has some sinus congestion  Objective: Vitals:   01/29/23 2000 01/29/23 2329 01/30/23 0443 01/30/23 0830  BP: (!) 175/106 126/65 (!) 139/100 137/84  Pulse: 96   80  Resp: 18  18 18   Temp: 98.1 F (36.7 C) 97.9 F (36.6 C) 98.1 F (36.7 C) (!) 97.5 F (36.4 C)  TempSrc: Oral Axillary Oral Oral  SpO2: 98%   93%  Weight:      Height:       No intake or output data in the 24 hours ending 01/30/23 1109  Filed Weights   01/27/23 1700 01/27/23 1846  Weight: 98 kg 98 kg  Physical Examination: Body mass index is 31 kg/m.   General: Obese built, not in obvious distress, able to comprehend but has mild expressive aphasia HENT:   No scleral pallor or icterus noted. Oral mucosa is moist.  Chest:  Diminished breath sounds bilaterally. No crackles or wheezes.  CVS: S1 &S2 heard. No murmur.  Regular rate and rhythm. Abdomen: Soft, nontender, nondistended.  Bowel sounds are heard.   Extremities: No cyanosis, clubbing or edema.  Peripheral pulses are palpable. Psych: Alert, awake and aphasic, much calmer and  Communicative, able to comprehend. CNS:  No cranial nerve deficits.  Moves all extremities.   Skin: Warm and dry.  No rashes noted.  Data Reviewed:   CBC: Recent Labs  Lab 01/27/23 1731 01/27/23 1738 01/28/23 0618 01/29/23 0345 01/30/23 0747  WBC 9.1  --  6.7 7.3 8.6  NEUTROABS 6.2  --   --   --   --   HGB 13.4 13.3 12.1* 12.4* 12.8*  HCT 38.9* 39.0 35.8* 37.1* 36.9*  MCV 105.1*  --  104.4* 103.9* 102.5*  PLT 252  --  235 238 240     Basic Metabolic Panel: Recent Labs  Lab 01/27/23 1731 01/27/23 1738 01/28/23 0618 01/29/23 0345 01/30/23 0747  NA 135 137 135 137 139  K 3.6 3.6 3.8 3.8 4.2  CL 106 105 102 107 106  CO2 16*  --  21* 22 25  GLUCOSE 126* 123* 88 99 98  BUN 19 20 13 12 12   CREATININE 1.15 1.20 1.14 1.17 1.22  CALCIUM 8.4*  --  8.2* 8.5* 8.7*  MG  --   --   --  2.1 2.0     Liver Function Tests: Recent Labs  Lab 01/27/23 1731 01/28/23 0618  AST 24 20  ALT 21 19  ALKPHOS 74 64  BILITOT 0.8 0.7  PROT 6.2* 5.5*  ALBUMIN 3.4* 2.9*      Radiology Studies: ECHOCARDIOGRAM COMPLETE  Result Date: 01/28/2023    ECHOCARDIOGRAM REPORT   Patient Name:   Juan [name] Date of Exam: 01/28/2023 Medical Rec #:  [id number]         Height:       70.0 in Accession #:    [id number]        Weight:       216.0 lb Date of Birth:  1925/12/01        BSA:          2.157 m Patient Age:    56 years          BP:           145/83 mmHg Patient Gender: M                 HR:           89 bpm. Exam Location:  Inpatient Procedure: 2D Echo, Cardiac Doppler and Color Doppler Indications:    stroke  History:        Patient has no prior history of Echocardiogram examinations.                 Chronic kidney disease. Cancer; Arrythmias:Paroxysmal A-fib.  Sonographer:    Johny Chess RDCS Referring Phys: [id number] TIMOTHY S OPYD  Sonographer Comments: Image acquisition challenging due to uncooperative patient. IMPRESSIONS  1. Left ventricular ejection fraction, by estimation, is 55 to  60%. The left ventricle has normal function. The left ventricle has no regional wall motion abnormalities. There is mild concentric left ventricular  hypertrophy. Left ventricular diastolic parameters are indeterminate.  2. Right ventricular systolic function is normal. The right ventricular size is normal. There is moderately elevated pulmonary artery systolic pressure. The estimated right ventricular systolic pressure is 123XX123 mmHg.  3. Left atrial size was mildly dilated.  4. The mitral valve is grossly normal. Mild mitral valve regurgitation.  5. The aortic valve is tricuspid. There is mild calcification of the aortic valve. Aortic valve regurgitation is mild. Aortic valve sclerosis/calcification is present, without any evidence of aortic stenosis.  6. Aortic dilatation noted. There is mild dilatation of the ascending aorta, measuring 40 mm.  7. The inferior vena cava is dilated in size with <50% respiratory variability, suggesting right atrial pressure of 15 mmHg. Comparison(s): No prior Echocardiogram. FINDINGS  Left Ventricle: Left ventricular ejection fraction, by estimation, is 55 to 60%. The left ventricle has normal function. The left ventricle has no regional wall motion abnormalities. The left ventricular internal cavity size was normal in size. There is  mild concentric left ventricular hypertrophy. Left ventricular diastolic function could not be evaluated due to atrial fibrillation. Left ventricular diastolic parameters are indeterminate. Right Ventricle: The right ventricular size is normal. No increase in right ventricular wall thickness. Right ventricular systolic function is normal. There is moderately elevated pulmonary artery systolic pressure. The tricuspid regurgitant velocity is 3.28 m/s, and with an assumed right atrial pressure of 15 mmHg, the estimated right ventricular systolic pressure is 123XX123 mmHg. Left Atrium: Left atrial size was mildly dilated. Right Atrium: Right atrial size was  normal in size. Pericardium: There is no evidence of pericardial effusion. Presence of epicardial fat layer. Mitral Valve: The mitral valve is grossly normal. Mild mitral annular calcification. Mild mitral valve regurgitation. Tricuspid Valve: The tricuspid valve is grossly normal. Tricuspid valve regurgitation is mild. Aortic Valve: The aortic valve is tricuspid. There is mild calcification of the aortic valve. There is mild to moderate aortic valve annular calcification. Aortic valve regurgitation is mild. Aortic valve sclerosis/calcification is present, without any evidence of aortic stenosis. Pulmonic Valve: The pulmonic valve was grossly normal. Pulmonic valve regurgitation is trivial. Aorta: Aortic dilatation noted. There is mild dilatation of the ascending aorta, measuring 40 mm. Venous: The inferior vena cava is dilated in size with less than 50% respiratory variability, suggesting right atrial pressure of 15 mmHg. IAS/Shunts: No atrial level shunt detected by color flow Doppler.  LEFT VENTRICLE PLAX 2D LVIDd:         5.00 cm LVIDs:         3.50 cm LV PW:         1.10 cm LV IVS:        1.10 cm LVOT diam:     2.30 cm LVOT Area:     4.15 cm  RIGHT VENTRICLE          IVC RV Basal diam:  3.40 cm  IVC diam: 3.00 cm TAPSE (M-mode): 1.4 cm LEFT ATRIUM              Index        RIGHT ATRIUM           Index LA diam:        5.30 cm  2.46 cm/m   RA Area:     23.80 cm LA Vol (A2C):   122.0 ml 56.56 ml/m  RA Volume:   73.10 ml  33.89 ml/m LA Vol (A4C):   48.1 ml  22.30 ml/m LA Biplane Vol: 78.1 ml  36.21  ml/m   AORTA Ao Root diam: 3.80 cm Ao Asc diam:  4.00 cm TRICUSPID VALVE TR Peak grad:   43.0 mmHg TR Vmax:        328.00 cm/s  SHUNTS Systemic Diam: 2.30 cm Rozann Lesches MD Electronically signed by Rozann Lesches MD Signature Date/Time: 01/28/2023/12:46:04 PM    Final       LOS: 2 days    Flora Lipps, MD [facility] Available via Epic secure chat 7am-7pm After these hours, please refer to  coverage provider listed on [email or web address] 01/30/2023, 11:09 AM

## 2023-01-31 DIAGNOSIS — E785 Hyperlipidemia, unspecified: Secondary | ICD-10-CM | POA: Diagnosis not present

## 2023-01-31 DIAGNOSIS — G9340 Encephalopathy, unspecified: Secondary | ICD-10-CM | POA: Diagnosis not present

## 2023-01-31 DIAGNOSIS — I639 Cerebral infarction, unspecified: Secondary | ICD-10-CM | POA: Diagnosis not present

## 2023-01-31 DIAGNOSIS — N1831 Chronic kidney disease, stage 3a: Secondary | ICD-10-CM | POA: Diagnosis not present

## 2023-01-31 NOTE — Progress Notes (Signed)
PROGRESS NOTE    Juan [name]  [id number] DOB: Jan 21, 1926 DOA: 01/27/2023 PCP: Pcp, No    Brief Narrative:  Juan [name] is a 89 y.o. male with past medical history of hyperlipidemia, prostate cancer and paroxysmal atrial fibrillation not on anticoagulation presented to the hospital with garbled speech, confusion and agitation.  Patient was subsequently found to CVA and admitted to the hospitalist service.  See below for further details.  Assessment/Plan  Acute CVA Embolic given history of PAF-not on anticoagulation prior to this hospitalization Echo with EF 55-60% A1c 5.9, LDL 90 CTA head/neck without LVO Evaluated by stroke MD-now on Eliquis. Awaiting CIR bed.  Delirium Improved Family at bedside-answering simple questions appropriately Continue low-dose Seroquel  Paroxysmal atrial fibrillation  Sinus rhythm Has been started on Eliquis.   CKD IIIa with metabolic acidosis. Creatinine at baseline-metabolic acidosis resolved  Sinus congestion Supportive care with intranasal steroids  Debility weakness deconditioning.   Secondary to acute illness superimposed on chronic debility CIR bed pending.   DVT prophylaxis:  apixaban (ELIQUIS) tablet 5 mg   Code Status:     Code Status: Full Code  Disposition:  PT OT has recommended CIR.  Status is: Inpatient  The patient is inpatient because: Acute stroke, need for rehabilitation.   Family Communication:  I again spoke with the patient's daughter at bedside and updated clinical condition of the patient   Consultants:  Neurology  Procedures:  None  Antimicrobials:  None  Anti-infectives (From admission, onward)    None      Subjective: Awake/alert-daughter at bedside.  No major issues overnight.  Objective: Vitals:   01/30/23 2120 01/31/23 0509 01/31/23 0517 01/31/23 0834  BP: (!) 147/81  (!) 153/84 113/62  Pulse: (!) 105     Resp:      Temp: (!) 97.1 F (36.2 C) (!) 97.2 F (36.2 C)  (!) 97.2 F (36.2 C) (!) 97.2 F (36.2 C)  TempSrc: Temporal Oral Oral Oral  SpO2: (!) 87%     Weight:      Height:       No intake or output data in the 24 hours ending 01/31/23 1256  Filed Weights   01/27/23 1700 01/27/23 1846  Weight: 98 kg 98 kg    Physical Examination: Body mass index is 31 kg/m.  Gen Exam:Alert awake-not in any distress HEENT:atraumatic, normocephalic Chest: B/L clear to auscultation anteriorly CVS:S1S2 regular Abdomen:soft non tender, non distended Extremities:no edema Neurology: Non focal Skin: no rash  Data Reviewed:   CBC: Recent Labs  Lab 01/27/23 1731 01/27/23 1738 01/28/23 0618 01/29/23 0345 01/30/23 0747  WBC 9.1  --  6.7 7.3 8.6  NEUTROABS 6.2  --   --   --   --   HGB 13.4 13.3 12.1* 12.4* 12.8*  HCT 38.9* 39.0 35.8* 37.1* 36.9*  MCV 105.1*  --  104.4* 103.9* 102.5*  PLT 252  --  235 238 240     Basic Metabolic Panel: Recent Labs  Lab 01/27/23 1731 01/27/23 1738 01/28/23 0618 01/29/23 0345 01/30/23 0747  NA 135 137 135 137 139  K 3.6 3.6 3.8 3.8 4.2  CL 106 105 102 107 106  CO2 16*  --  21* 22 25  GLUCOSE 126* 123* 88 99 98  BUN 19 20 13 12 12   CREATININE 1.15 1.20 1.14 1.17 1.22  CALCIUM 8.4*  --  8.2* 8.5* 8.7*  MG  --   --   --  2.1 2.0  Liver Function Tests: Recent Labs  Lab 01/27/23 1731 01/28/23 0618  AST 24 20  ALT 21 19  ALKPHOS 74 64  BILITOT 0.8 0.7  PROT 6.2* 5.5*  ALBUMIN 3.4* 2.9*      Radiology Studies: No results found.    LOS: 3 days    Juan Binet, MD [facility] Available via Epic secure chat 7am-7pm After these hours, please refer to coverage provider listed on [email or web address] 01/31/2023, 12:56 PM

## 2023-01-31 NOTE — Progress Notes (Signed)
Inpatient Rehab Admissions Coordinator:   Discussed discharge plans with pt's daughter, Orene Desanctis.  Family pulling together schedule for 24/7 supervision.  We discussed insurance auth and I will start this request today.  Will follow.   Shann Medal, PT, DPT Admissions Coordinator [phone] 01/31/23  11:53 AM

## 2023-01-31 NOTE — Progress Notes (Shared)
PMR Admission Coordinator Pre-Admission Assessment  Patient: Juan [name] is an 89 y.o., male MRN: [id number] DOB: 1926-09-03 Height: 5\' 10"  (177.8 cm) Weight: 98 kg  Insurance Information HMO: ***    PPO: ***     PCP:      IPA:      80/20:      OTHER:  PRIMARY: Blue Medicare      Policy#: [id number]       Subscriber: pt CM Name: Larene Beach      Phone#: [phone]     Fax#: [phone] Pre-Cert#: tbd on admit      Employer:  Benefits:  Phone #: [phone]     Name:  Eff. Date: ***     Deduct: $0      Out of Pocket Max: ***      Life Max: n/a CIR: ***      SNF: 20 full days Outpatient:      Co-Pay: *** Home Health: 100%      Co-Pay:  DME: 80%     Co-Pay: 20% Providers:  SECONDARY:       Policy#:      Phone#:   Development worker, community:       Phone#:   The Therapist, art Information Summary" for patients in Inpatient Rehabilitation Facilities with attached "Privacy Act [place] Records" was provided and verbally reviewed with: Patient and Family  Emergency Contact Information Contact Information     Name Relation Home Work Mobile   Time Daughter   [phone]       Current Medical History  Patient Admitting Diagnosis: CVA  History of Present Illness: Pt is a 89 y/o male with PMH of hyperlipidemia, prostate cancer, and PAF (not on anticoagulation), who admitted to [facility] on 01/27/23 with c/o garbled speech, confusion, and agitation.  In ED pt afebrile, satting mid 90s on RA, slightly tacchycardic, SBP okay.  Labs notable for ETOH 12 and serum bicarbonate 16.  Head CT reveals an ill defined hypodensity in the left superior cerebellum and right frontal encephalomalacia.  CTA motion degraded.  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  Pt started on eliquis.  EEG showed no obvious seizure activity.  A1C 5.9.  Therapy evaluations completed and pt was recommended for CIR.  Complete NIHSS TOTAL: 3  [facility]'s medical record from [facility] has been reviewed by the rehabilitation admission coordinator and physician.  Past Medical History  Past Medical History:  Diagnosis Date   Arrhythmia    Cancer ([place])    Hyperlipidemia    Prostate cancer ([place])    Shingles outbreak    LEFT CHEST WALL    Has the patient had major surgery during 100 days prior to admission? No  Family History   family history is not on file.  Current Medications  Current Facility-Administered Medications:    acetaminophen (TYLENOL) tablet 650 mg, 650 mg, Oral, Q4H PRN, 650 mg at 01/28/23 2031 **OR** acetaminophen (TYLENOL) 160 MG/5ML solution 650 mg, 650 mg, Per Tube, Q4H PRN **OR** acetaminophen (TYLENOL) suppository 650 mg, 650 mg, Rectal, Q4H PRN, Opyd, Ilene Qua, MD   apixaban (ELIQUIS) tablet 5 mg, 5 mg, Oral, BID, Pierce, Dwayne A, RPH, 5 mg at 01/31/23 0932   fluticasone (FLONASE) 50 MCG/ACT nasal spray 2 spray, 2 spray, Each Nare, Daily, Pokhrel, Laxman, MD, 2 spray at 01/31/23 0932   haloperidol lactate (HALDOL) injection 2 mg, 2 mg, Intravenous, Q6H PRN, Pokhrel, Laxman, MD, 2 mg at 01/30/23 2116   QUEtiapine (  SEROQUEL) tablet 25 mg, 25 mg, Oral, QHS, Pokhrel, Laxman, MD, 25 mg at 01/30/23 2116   rosuvastatin (CRESTOR) tablet 20 mg, 20 mg, Oral, Daily, Beulah Gandy A, NP, 20 mg at 01/31/23 0932   senna-docusate (Senokot-S) tablet 1 tablet, 1 tablet, Oral, QHS PRN, Opyd, Ilene Qua, MD, 1 tablet at 01/28/23 2031   sodium chloride (OCEAN) 0.65 % nasal spray 1 spray, 1 spray, Each Nare, PRN, Howerter, Justin B, DO, 1 spray at 01/29/23 2252  Patients Current Diet:  Diet Order             Diet regular Fluid consistency: Thin  Diet effective now                   Precautions / Restrictions Precautions Precautions: Fall Restrictions Weight Bearing Restrictions: No   Has the patient had 2 or more falls or a fall with injury in the past year? No  Prior Activity Level Limited Community (1-2x/wk): independent, driving, no  DME  Prior Functional Level Self Care: Did the patient need help bathing, dressing, using the toilet or eating? Independent  Indoor Mobility: Did the patient need assistance with walking from room to room (with or without device)? Independent  Stairs: Did the patient need assistance with internal or external stairs (with or without device)? Independent  Functional Cognition: Did the patient need help planning regular tasks such as shopping or remembering to take medications? Independent  Patient Information Are you of Hispanic, Latino/a,or Spanish origin?: A. No, not of Hispanic, Latino/a, or Spanish origin, X. Patient unable to respond (proxy) What is your race?: A. White, X. Patient unable to respond Do you need or want an interpreter to communicate with a doctor or health care staff?: 9. Unable to respond  Patient's Response To:  Health Literacy and Transportation Is the patient able to respond to health literacy and transportation needs?: No Health Literacy - How often do you need to have someone help you when you read instructions, pamphlets, or other written material from your doctor or pharmacy?: Patient unable to respond In the past 12 months, has lack of transportation kept you from medical appointments or from getting medications?: No In the past 12 months, has lack of transportation kept you from meetings, work, or from getting things needed for daily living?: No  Development worker, international aid / [place] Devices/Equipment: None Home Equipment: Other (comment), None  Prior Device Use: Indicate devices/aids used by the patient prior to current illness, exacerbation or injury? None of the above  Current Functional Level Cognition  Arousal/Alertness: Awake/alert Overall Cognitive Status: Difficult to assess Difficult to assess due to: Impaired communication Orientation Level: Oriented to person, Oriented to place, Disoriented to time, Disoriented to  situation Following Commands:  (delayed response speed, multimodal cues) General Comments: Difficulty following verbal commands but doing well with gestures. Comments: cognition difficult to assess secondary to patient's severe aphasia.    Extremity Assessment (includes Sensation/Coordination)  Upper Extremity Assessment: Defer to OT evaluation RUE Deficits / Details: used functionally for oral hygiene and LB ADLs - difficult to fully assess due to impiared cog/speech LUE Deficits / Details: used functionally for oral hygiene and LB ADLs - difficult to fully assess due to impiared cog/speech  Lower Extremity Assessment: Overall WFL for tasks assessed (unable to perform formal MMT due to pt's cognition but lifting BLE against gravity, no overt difference noted)    ADLs  Overall ADL's : Needs assistance/impaired Eating/Feeding: Set up, Sitting Grooming: Oral care, Standing,  Supervision/safety, Wash/dry hands Grooming Details (indicate cue type and reason): thorough, sequenced independently Upper Body Bathing: Minimal assistance, Sitting Lower Body Bathing: Minimal assistance, Sit to/from stand Upper Body Dressing : Sitting, Set up, Min guard Lower Body Dressing: Set up, Sitting/lateral leans Toilet Transfer: Min guard, Ambulation Toileting- Clothing Manipulation and Hygiene: Supervision/safety Functional mobility during ADLs: Min guard General ADL Comments: visual and tactile cues needed to initiate all tasks    Mobility  Overal bed mobility: Needs Assistance Bed Mobility: Supine to Sit Supine to sit: HOB elevated, Min guard Sit to supine: Supervision General bed mobility comments: min guard for safety, increased gestures for pt to inititate    Transfers  Overall transfer level: Needs assistance Equipment used: None Transfers: Sit to/from Stand Sit to Stand: Supervision General transfer comment: supervision for safety    Ambulation / Gait / Stairs / Wheelchair Mobility   Ambulation/Gait Ambulation/Gait assistance: Herbalist (Feet): 200 Feet Assistive device: None Gait Pattern/deviations: Step-through pattern, Wide base of support, Drifts right/left, Decreased stride length, Trunk flexed General Gait Details: wide BOS and increased lateral sway, minA provided for balance, no overt LOB but imbalance noted with pt reaching for support of furniture in room and hall intermittently. pt with increased c/o knee pain Gait velocity: decreased    Posture / Balance Balance Overall balance assessment: Needs assistance Sitting-balance support: No upper extremity supported, Feet supported Sitting balance-Leahy Scale: Good Standing balance support: No upper extremity supported, During functional activity Standing balance-Leahy Scale: Fair Standing balance comment: min guard no LOB, reaches for furniture/rail when available    Special needs/care consideration Behavioral consideration some combativeness with EMS and in ED, PRN haldol on acute.   Previous Home Environment (from acute therapy documentation) Living Arrangements: Alone  Lives With: Alone Available Help at Discharge: Family, Available PRN/intermittently Type of Home: House Home Layout: One level Home Access: Ramped entrance Bathroom Shower/Tub: Multimedia programmer: [place]: No Additional Comments: Pt has children who can assist intermittently, daughter at bedside reporting potential for pt to move in with her if needed  Discharge Living Setting Plans for Discharge Living Setting: Patient's home (children will stay with him for supervision) Type of Home at Discharge: House Discharge Home Layout: One level Discharge Home Access: [place] entrance Discharge Bathroom Shower/Tub: Walk-in shower Discharge Bathroom Toilet: Standard Discharge Bathroom Accessibility: Yes How Accessible: Accessible via wheelchair Does the patient have any problems obtaining your  medications?: No  Social/Family/Support Systems Anticipated Caregiver: Julien Girt (daughter) Anticipated Caregiver's Contact Information: [phone] Ability/Limitations of Caregiver: n/a Caregiver Availability: 24/7 Discharge Plan Discussed with Primary Caregiver: Yes Is Caregiver In Agreement with Plan?: Yes Does Caregiver/Family have Issues with Lodging/Transportation while Pt is in Rehab?: No  Goals Patient/Family Goal for Rehab: PT/OT/SLP supervision to mod I Expected length of stay: 7-10 days Additional Information: discharge plan: return to patient's home, have multiple family members prepared to provide 24/7 supervision Pt/Family Agrees to Admission and willing to participate: Yes Program Orientation Provided & Reviewed with Pt/Caregiver Including Roles  & Responsibilities: Yes  Barriers to Discharge: Insurance for SNF coverage  Decrease burden of Care through IP rehab admission: n/a  Possible need for SNF placement upon discharge: Not anticipated.  Plan for discharge to patient's home with family providing 24/7 supervision.   Patient Condition: I have reviewed medical records from [facility], spoken with CSW, and patient and daughter. I met with patient at the bedside and discussed via phone for inpatient rehabilitation assessment.  Patient will  benefit from ongoing PT, OT, and SLP, can actively participate in 3 hours of therapy a day 5 days of the week, and can make measurable gains during the admission.  Patient will also benefit from the coordinated team approach during an Inpatient Acute Rehabilitation admission.  The patient will receive intensive therapy as well as Rehabilitation physician, nursing, social worker, and care management interventions.  Due to bladder management, bowel management, safety, skin/wound care, disease management, medication administration, pain management, and patient education the patient requires 24 hour a day rehabilitation nursing.  The patient  is currently min assist with mobility and basic ADLs.  Discharge setting and therapy post discharge at home with home health is anticipated.  Patient has agreed to participate in the Acute Inpatient Rehabilitation Program and will admit pending insurance approval ***.  Preadmission Screen Completed By:  Michel Santee, 01/31/2023 11:57 AM

## 2023-01-31 NOTE — Plan of Care (Signed)

## 2023-01-31 NOTE — PMR Pre-admission (Signed)
PMR Admission Coordinator Pre-Admission Assessment  Patient: Juan [name] is an 89 y.o., male MRN: [id number] DOB: April 24, 1926 Height: 5\' 10"  (177.8 cm) Weight: 98 kg  Insurance Information HMO: ***    PPO: ***     PCP:      IPA:      80/20:      OTHER:  PRIMARY: Blue Medicare      Policy#: [id number]       Subscriber: pt CM Name: Larene Beach      Phone#: [phone]     Fax#: [phone] Pre-Cert#: tbd on admit      Employer:  Benefits:  Phone #: [phone]     Name:  Eff. Date: ***     Deduct: $0      Out of Pocket Max: ***      Life Max: n/a CIR: ***      SNF: 20 full days Outpatient:      Co-Pay: *** Home Health: 100%      Co-Pay:  DME: 80%     Co-Pay: 20% Providers:  SECONDARY:       Policy#:      Phone#:   Development worker, community:       Phone#:   The Therapist, art Information Summary" for patients in Inpatient Rehabilitation Facilities with attached "Privacy Act [place] Records" was provided and verbally reviewed with: Patient and Family  Emergency Contact Information Contact Information     Name Relation Home Work Mobile   [place] Daughter   [phone]       Current Medical History  Patient Admitting Diagnosis: CVA  History of Present Illness: Pt is a 89 y/o male with PMH of hyperlipidemia, prostate cancer, and PAF (not on anticoagulation), who admitted to [facility] on 01/27/23 with c/o garbled speech, confusion, and agitation.  In ED pt afebrile, satting mid 90s on RA, slightly tacchycardic, SBP okay.  Labs notable for ETOH 12 and serum bicarbonate 16.  Head CT reveals an ill defined hypodensity in the left superior cerebellum and right frontal encephalomalacia.  CTA motion degraded.  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  Pt started on eliquis.  EEG showed no obvious seizure activity.  A1C 5.9.  Therapy evaluations completed and pt was recommended for CIR.  Complete NIHSS TOTAL: 3  Patient's medical record from [facility] has been reviewed by the rehabilitation admission coordinator and physician.  Past Medical History  Past Medical History:  Diagnosis Date   Arrhythmia    Cancer ([place])    Hyperlipidemia    Prostate cancer ([place])    Shingles outbreak    LEFT CHEST WALL    Has the patient had major surgery during 100 days prior to admission? No  Family History   family history is not on file.  Current Medications  Current Facility-Administered Medications:    acetaminophen (TYLENOL) tablet 650 mg, 650 mg, Oral, Q4H PRN, 650 mg at 01/28/23 2031 **OR** acetaminophen (TYLENOL) 160 MG/5ML solution 650 mg, 650 mg, Per Tube, Q4H PRN **OR** acetaminophen (TYLENOL) suppository 650 mg, 650 mg, Rectal, Q4H PRN, Opyd, Ilene Qua, MD   apixaban (ELIQUIS) tablet 5 mg, 5 mg, Oral, BID, Pierce, Dwayne A, RPH, 5 mg at 01/31/23 0932   fluticasone (FLONASE) 50 MCG/ACT nasal spray 2 spray, 2 spray, Each Nare, Daily, Pokhrel, Laxman, MD, 2 spray at 01/31/23 0932   haloperidol lactate (HALDOL) injection 2 mg, 2 mg, Intravenous, Q6H PRN, Pokhrel, Laxman, MD, 2 mg at 01/30/23 2116   QUEtiapine (  SEROQUEL) tablet 25 mg, 25 mg, Oral, QHS, Pokhrel, Laxman, MD, 25 mg at 01/30/23 2116   rosuvastatin (CRESTOR) tablet 20 mg, 20 mg, Oral, Daily, Beulah Gandy A, NP, 20 mg at 01/31/23 0932   senna-docusate (Senokot-S) tablet 1 tablet, 1 tablet, Oral, QHS PRN, Opyd, Ilene Qua, MD, 1 tablet at 01/28/23 2031   sodium chloride (OCEAN) 0.65 % nasal spray 1 spray, 1 spray, Each Nare, PRN, Howerter, Justin B, DO, 1 spray at 01/29/23 2252  Patients Current Diet:  Diet Order             Diet regular Fluid consistency: Thin  Diet effective now                   Precautions / Restrictions Precautions Precautions: Fall Restrictions Weight Bearing Restrictions: No   Has the patient had 2 or more falls or a fall with injury in the past year? No  Prior Activity Level Limited Community (1-2x/wk): independent, driving, no  DME  Prior Functional Level Self Care: Did the patient need help bathing, dressing, using the toilet or eating? Independent  Indoor Mobility: Did the patient need assistance with walking from room to room (with or without device)? Independent  Stairs: Did the patient need assistance with internal or external stairs (with or without device)? Independent  Functional Cognition: Did the patient need help planning regular tasks such as shopping or remembering to take medications? Independent  Patient Information Are you of Hispanic, Latino/a,or Spanish origin?: A. No, not of Hispanic, Latino/a, or Spanish origin, X. Patient unable to respond (proxy) What is your race?: A. White, X. Patient unable to respond Do you need or want an interpreter to communicate with a doctor or health care staff?: 9. Unable to respond  Patient's Response To:  Health Literacy and Transportation Is the patient able to respond to health literacy and transportation needs?: No Health Literacy - How often do you need to have someone help you when you read instructions, pamphlets, or other written material from your doctor or pharmacy?: Patient unable to respond In the past 12 months, has lack of transportation kept you from medical appointments or from getting medications?: No In the past 12 months, has lack of transportation kept you from meetings, work, or from getting things needed for daily living?: No  Development worker, international aid / [place] Devices/Equipment: None Home Equipment: Other (comment), None  Prior Device Use: Indicate devices/aids used by the patient prior to current illness, exacerbation or injury? None of the above  Current Functional Level Cognition  Arousal/Alertness: Awake/alert Overall Cognitive Status: Difficult to assess Difficult to assess due to: Impaired communication Orientation Level: Oriented to person, Oriented to place, Disoriented to time, Disoriented to  situation Following Commands:  (delayed response speed, multimodal cues) General Comments: Difficulty following verbal commands but doing well with gestures. Comments: cognition difficult to assess secondary to patient's severe aphasia.    Extremity Assessment (includes Sensation/Coordination)  Upper Extremity Assessment: Defer to OT evaluation RUE Deficits / Details: used functionally for oral hygiene and LB ADLs - difficult to fully assess due to impiared cog/speech LUE Deficits / Details: used functionally for oral hygiene and LB ADLs - difficult to fully assess due to impiared cog/speech  Lower Extremity Assessment: Overall WFL for tasks assessed (unable to perform formal MMT due to pt's cognition but lifting BLE against gravity, no overt difference noted)    ADLs  Overall ADL's : Needs assistance/impaired Eating/Feeding: Set up, Sitting Grooming: Oral care, Standing,  Supervision/safety, Wash/dry hands Grooming Details (indicate cue type and reason): thorough, sequenced independently Upper Body Bathing: Minimal assistance, Sitting Lower Body Bathing: Minimal assistance, Sit to/from stand Upper Body Dressing : Sitting, Set up, Min guard Lower Body Dressing: Set up, Sitting/lateral leans Toilet Transfer: Min guard, Ambulation Toileting- Clothing Manipulation and Hygiene: Supervision/safety Functional mobility during ADLs: Min guard General ADL Comments: visual and tactile cues needed to initiate all tasks    Mobility  Overal bed mobility: Needs Assistance Bed Mobility: Supine to Sit Supine to sit: HOB elevated, Min guard Sit to supine: Supervision General bed mobility comments: min guard for safety, increased gestures for pt to inititate    Transfers  Overall transfer level: Needs assistance Equipment used: None Transfers: Sit to/from Stand Sit to Stand: Supervision General transfer comment: supervision for safety    Ambulation / Gait / Stairs / Wheelchair Mobility   Ambulation/Gait Ambulation/Gait assistance: Herbalist (Feet): 200 Feet Assistive device: None Gait Pattern/deviations: Step-through pattern, Wide base of support, Drifts right/left, Decreased stride length, Trunk flexed General Gait Details: wide BOS and increased lateral sway, minA provided for balance, no overt LOB but imbalance noted with pt reaching for support of furniture in room and hall intermittently. pt with increased c/o knee pain Gait velocity: decreased    Posture / Balance Balance Overall balance assessment: Needs assistance Sitting-balance support: No upper extremity supported, Feet supported Sitting balance-Leahy Scale: Good Standing balance support: No upper extremity supported, During functional activity Standing balance-Leahy Scale: Fair Standing balance comment: min guard no LOB, reaches for furniture/rail when available    Special needs/care consideration Behavioral consideration some combativeness with EMS and in ED, PRN haldol on acute.   Previous Home Environment (from acute therapy documentation) Living Arrangements: Alone  Lives With: Alone Available Help at Discharge: Family, Available PRN/intermittently Type of Home: House Home Layout: One level Home Access: Ramped entrance Bathroom Shower/Tub: Multimedia programmer: [place]: No Additional Comments: Pt has children who can assist intermittently, daughter at bedside reporting potential for pt to move in with her if needed  Discharge Living Setting Plans for Discharge Living Setting: Patient's home (children will stay with him for supervision) Type of Home at Discharge: House Discharge Home Layout: One level Discharge Home Access: [place] entrance Discharge Bathroom Shower/Tub: Walk-in shower Discharge Bathroom Toilet: Standard Discharge Bathroom Accessibility: Yes How Accessible: Accessible via wheelchair Does the patient have any problems obtaining your  medications?: No  Social/Family/Support Systems Anticipated Caregiver: Julien Girt (daughter) Anticipated Caregiver's Contact Information: [phone] Ability/Limitations of Caregiver: n/a Caregiver Availability: 24/7 Discharge Plan Discussed with Primary Caregiver: Yes Is Caregiver In Agreement with Plan?: Yes Does Caregiver/Family have Issues with Lodging/Transportation while Pt is in Rehab?: No  Goals Patient/Family Goal for Rehab: PT/OT/SLP supervision to mod I Expected length of stay: 7-10 days Additional Information: discharge plan: return to patient's home, have multiple family members prepared to provide 24/7 supervision Pt/Family Agrees to Admission and willing to participate: Yes Program Orientation Provided & Reviewed with Pt/Caregiver Including Roles  & Responsibilities: Yes  Barriers to Discharge: Insurance for SNF coverage  Decrease burden of Care through IP rehab admission: n/a  Possible need for SNF placement upon discharge: Not anticipated.  Plan for discharge to patient's home with family providing 24/7 supervision.   Patient Condition: I have reviewed medical records from [facility], spoken with CSW, and patient and daughter. I met with patient at the bedside and discussed via phone for inpatient rehabilitation assessment.  Patient will  benefit from ongoing PT, OT, and SLP, can actively participate in 3 hours of therapy a day 5 days of the week, and can make measurable gains during the admission.  Patient will also benefit from the coordinated team approach during an Inpatient Acute Rehabilitation admission.  The patient will receive intensive therapy as well as Rehabilitation physician, nursing, social worker, and care management interventions.  Due to bladder management, bowel management, safety, skin/wound care, disease management, medication administration, pain management, and patient education the patient requires 24 hour a day rehabilitation nursing.  The patient  is currently min assist with mobility and basic ADLs.  Discharge setting and therapy post discharge at home with home health is anticipated.  Patient has agreed to participate in the Acute Inpatient Rehabilitation Program and will admit pending insurance approval ***.  Preadmission Screen Completed By:  Michel Santee, 01/31/2023 11:57 AM ______________________________________________________________________   Discussed status with Dr. Marland Kitchen on *** at *** and received approval for admission today.  Admission Coordinator:  Michel Santee, PT, time Marland KitchenSudie Grumbling ***   Assessment/Plan: Diagnosis: Does the need for close, 24 hr/day Medical supervision in concert with the patient's rehab needs make it unreasonable for this patient to be served in a less intensive setting? {yes_no_potentially:3041433} Co-Morbidities requiring supervision/potential complications: *** Due to {due WC:4653188, does the patient require 24 hr/day rehab nursing? {yes_no_potentially:3041433} Does the patient require coordinated care of a physician, rehab nurse, PT, OT, and SLP to address physical and functional deficits in the context of the above medical diagnosis(es)? {yes_no_potentially:3041433} Addressing deficits in the following areas: {deficits:3041436} Can the patient actively participate in an intensive therapy program of at least 3 hrs of therapy 5 days a week? {yes_no_potentially:3041433} The potential for patient to make measurable gains while on inpatient rehab is {potential:3041437} Anticipated functional outcomes upon discharge from inpatient rehab: {functional outcomes:304600100} PT, {functional outcomes:304600100} OT, {functional outcomes:304600100} SLP Estimated rehab length of stay to reach the above functional goals is: *** Anticipated discharge destination: {anticipated dc setting:21604} 10. Overall Rehab/Functional Prognosis: {potential:3041437}   MD Signature: ***

## 2023-01-31 NOTE — Progress Notes (Signed)
Physical Therapy Treatment Patient Details Name: Juan [name] MRN: [id number] DOB: 12-13-1925 Today's Date: 01/31/2023   History of Present Illness Juan [name] is a 89 y.o. male who presents on 3/22 with garbled speech, confusion, and agitation. 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 PMHx:  hyperlipidemia, prostate cancer, and paroxysmal atrial fibrillation not anticoagulated    PT Comments    Pt with continued progress towards acute goals this date with focus on following verbal commands during activity. Pt continues to require gestures and tactile commands for all mobility with pt unable to follow verbal only commands placing pt at increased risk for falls. Pt continues to be limited by impaired balance/postural reactions needing min assist during gait to steady with pt reaching for rail in hall and furniture in room to steady self, pt declining AD use. Continued education for frequent mobilization with pt and pt daughter as well as time up OOB. Current plan remains appropriate to address deficits and maximize functional independence and decrease caregiver burden. Pt continues to benefit from skilled PT services to progress toward functional mobility goals.    Recommendations for follow up therapy are one component of a multi-disciplinary discharge planning process, led by the attending physician.  Recommendations may be updated based on patient status, additional functional criteria and insurance authorization.  Follow Up Recommendations       Assistance Recommended at Discharge Frequent or constant Supervision/Assistance  Patient can return home with the following A little help with walking and/or transfers;Assist for transportation;Help with stairs or ramp for entrance   Equipment Recommendations  Other (comment) (will continue to assess)    Recommendations for  Other Services       Precautions / Restrictions Precautions Precautions: Fall Restrictions Weight Bearing Restrictions: No     Mobility  Bed Mobility Overal bed mobility: Needs Assistance Bed Mobility: Supine to Sit     Supine to sit: HOB elevated, Min guard     General bed mobility comments: min guard for safety, increased gestures for pt to inititate    Transfers Overall transfer level: Needs assistance Equipment used: None Transfers: Sit to/from Stand Sit to Stand: Supervision           General transfer comment: supervision for safety    Ambulation/Gait Ambulation/Gait assistance: Min assist Gait Distance (Feet): 200 Feet Assistive device: None Gait Pattern/deviations: Step-through pattern, Wide base of support, Drifts right/left, Decreased stride length, Trunk flexed Gait velocity: decreased     General Gait Details: wide BOS and increased lateral sway, minA provided for balance, no overt LOB but imbalance noted with pt reaching for support of furniture in room and hall intermittently. pt with increased c/o knee pain   Stairs             Wheelchair Mobility    Modified Rankin (Stroke Patients Only) Modified Rankin (Stroke Patients Only) Pre-Morbid Rankin Score: No symptoms Modified Rankin: Moderately severe disability     Balance Overall balance assessment: Needs assistance Sitting-balance support: No upper extremity supported, Feet supported Sitting balance-Leahy Scale: Good     Standing balance support: No upper extremity supported, During functional activity Standing balance-Leahy Scale: Fair Standing balance comment: min guard no LOB, reaches for furniture/rail when available                            Cognition Arousal/Alertness: Awake/alert Behavior During Therapy: Restless Overall  Cognitive Status: Difficult to assess Area of Impairment: Following commands                       Following Commands:   (delayed response speed, multimodal cues)       General Comments: Difficulty following verbal commands but doing well with gestures.        Exercises General Exercises - Lower Extremity Long Arc Quad: AROM, Right, Left, 10 reps, Seated Hip Flexion/Marching: Right, Left, 20 reps, Seated    General Comments General comments (skin integrity, edema, etc.): VSS on RA, pt limited by impaired communication and ability to follow verbal commands, wrapped knee with ace wrap to provide compression with pt endorsing improvement      Pertinent Vitals/Pain Pain Assessment Pain Assessment: Faces Faces Pain Scale: Hurts little more Pain Location: L knee Pain Descriptors / Indicators: Guarding, Grimacing Pain Intervention(s): Monitored during session, Limited activity within patient's tolerance, Other (comment) (ace wrap for sompression)    Home Living                          Prior Function            PT Goals (current goals can now be found in the care plan section) Acute Rehab PT Goals Patient Stated Goal: none stated by pt PT Goal Formulation: With family Time For Goal Achievement: 02/11/23 Progress towards PT goals: Progressing toward goals    Frequency    Min 3X/week      PT Plan      Co-evaluation              AM-PAC PT "6 Clicks" Mobility   Outcome Measure  Help needed turning from your back to your side while in a flat bed without using bedrails?: A Little Help needed moving from lying on your back to sitting on the side of a flat bed without using bedrails?: A Little Help needed moving to and from a bed to a chair (including a wheelchair)?: A Little Help needed standing up from a chair using your arms (e.g., wheelchair or bedside chair)?: A Little Help needed to walk in hospital room?: A Little Help needed climbing 3-5 steps with a railing? : Total 6 Click Score: 16    End of Session   Activity Tolerance: Patient tolerated treatment  well Patient left: in chair;with call bell/phone within reach;with family/visitor present Nurse Communication: Mobility status PT Visit Diagnosis: Difficulty in walking, not elsewhere classified (R26.2);Unsteadiness on feet (R26.81)     Time: 1005-1020 PT Time Calculation (min) (ACUTE ONLY): 15 min  Charges:  $Therapeutic Activity: 8-22 mins                     Kaleb Linquist R. PTA Acute Rehabilitation Services Office: [place] 01/31/2023, 10:33 AM

## 2023-01-31 NOTE — Plan of Care (Signed)
  Problem: Education: Goal: Knowledge of disease or condition will improve 01/31/2023 0430 by Gena Fray, RN Outcome: Progressing 01/31/2023 0428 by Gena Fray, RN Outcome: Progressing Goal: Knowledge of secondary prevention will improve (MUST DOCUMENT ALL) 01/31/2023 0430 by Gena Fray, RN Outcome: Progressing 01/31/2023 0428 by Gena Fray, RN Outcome: Progressing Goal: Knowledge of patient specific risk factors will improve [place]ta [place] N/A or DELETE if not current risk factor) 01/31/2023 0430 by Gena Fray, RN Outcome: Progressing 01/31/2023 0428 by Gena Fray, RN Outcome: Progressing   Problem: Ischemic Stroke/TIA Tissue Perfusion: Goal: Complications of ischemic stroke/TIA will be minimized 01/31/2023 0430 by Gena Fray, RN Outcome: Progressing 01/31/2023 0428 by Gena Fray, RN Outcome: Progressing   Problem: Coping: Goal: Will verbalize positive feelings about self 01/31/2023 0430 by Gena Fray, RN Outcome: Progressing 01/31/2023 0428 by Gena Fray, RN Outcome: Progressing Goal: Will identify appropriate support needs 01/31/2023 0430 by Gena Fray, RN Outcome: Progressing 01/31/2023 0428 by Gena Fray, RN Outcome: Progressing

## 2023-01-31 NOTE — Care Management Important Message (Signed)
Important Message  Patient Details  Name: Juan [name] MRN: [id number] Date of Birth: Feb 12, 1926   Medicare Important Message Given:  Yes     Orbie Pyo 01/31/2023, 2:26 PM

## 2023-02-01 ENCOUNTER — Encounter (HOSPITAL_COMMUNITY): Payer: Self-pay | Admitting: Physical Medicine & Rehabilitation

## 2023-02-01 ENCOUNTER — Encounter (HOSPITAL_COMMUNITY): Payer: Self-pay | Admitting: Family Medicine

## 2023-02-01 ENCOUNTER — Other Ambulatory Visit: Payer: Self-pay

## 2023-02-01 ENCOUNTER — Inpatient Hospital Stay (HOSPITAL_COMMUNITY)
Admission: RE | Admit: 2023-02-01 | Discharge: 2023-02-04 | DRG: 057 | Disposition: A | Discharge status: Home / Self Care | Payer: Medicare Other | Source: Intra-hospital | Attending: Physical Medicine & Rehabilitation | Admitting: Physical Medicine & Rehabilitation

## 2023-02-01 DIAGNOSIS — E785 Hyperlipidemia, unspecified: Secondary | ICD-10-CM | POA: Diagnosis not present

## 2023-02-01 DIAGNOSIS — Z7901 Long term (current) use of anticoagulants: Secondary | ICD-10-CM | POA: Diagnosis not present

## 2023-02-01 DIAGNOSIS — E872 Acidosis, unspecified: Secondary | ICD-10-CM | POA: Diagnosis present

## 2023-02-01 DIAGNOSIS — I480 Paroxysmal atrial fibrillation: Secondary | ICD-10-CM

## 2023-02-01 DIAGNOSIS — Z8546 Personal history of malignant neoplasm of prostate: Secondary | ICD-10-CM

## 2023-02-01 DIAGNOSIS — N1831 Chronic kidney disease, stage 3a: Secondary | ICD-10-CM

## 2023-02-01 DIAGNOSIS — Z23 Encounter for immunization: Secondary | ICD-10-CM | POA: Diagnosis not present

## 2023-02-01 DIAGNOSIS — Z79899 Other long term (current) drug therapy: Secondary | ICD-10-CM

## 2023-02-01 DIAGNOSIS — I700 Atherosclerosis of aorta: Secondary | ICD-10-CM | POA: Diagnosis present

## 2023-02-01 DIAGNOSIS — I69320 Aphasia following cerebral infarction: Principal | ICD-10-CM

## 2023-02-01 DIAGNOSIS — I6602 Occlusion and stenosis of left middle cerebral artery: Secondary | ICD-10-CM | POA: Diagnosis not present

## 2023-02-01 DIAGNOSIS — I6389 Other cerebral infarction: Principal | ICD-10-CM

## 2023-02-01 DIAGNOSIS — I639 Cerebral infarction, unspecified: Secondary | ICD-10-CM | POA: Diagnosis present

## 2023-02-01 DIAGNOSIS — R410 Disorientation, unspecified: Secondary | ICD-10-CM | POA: Diagnosis not present

## 2023-02-01 DIAGNOSIS — I63412 Cerebral infarction due to embolism of left middle cerebral artery: Secondary | ICD-10-CM

## 2023-02-01 DIAGNOSIS — I129 Hypertensive chronic kidney disease with stage 1 through stage 4 chronic kidney disease, or unspecified chronic kidney disease: Secondary | ICD-10-CM | POA: Diagnosis not present

## 2023-02-01 MED ORDER — GUAIFENESIN-DM 100-10 MG/5ML PO SYRP: 5.0000 mL | ORAL_SOLUTION | Freq: Four times a day (QID) | ORAL | Status: DC | PRN

## 2023-02-01 MED ORDER — QUETIAPINE FUMARATE 25 MG PO TABS: 25.0000 mg | ORAL_TABLET | Freq: Every day | ORAL | Status: DC

## 2023-02-01 MED ORDER — POLYETHYLENE GLYCOL 3350 17 G PO PACK: 17.0000 g | PACK | Freq: Every day | ORAL | Status: DC | PRN

## 2023-02-01 MED ORDER — PNEUMOCOCCAL 20-VAL CONJ VACC 0.5 ML IM SUSY
0.5000 mL | PREFILLED_SYRINGE | INTRAMUSCULAR | Status: AC
  Administered 2023-02-03: 0.5 mL via INTRAMUSCULAR
  Filled 2023-02-01: qty 0.5

## 2023-02-01 MED ORDER — SALINE SPRAY 0.65 % NA SOLN: 1.0000 | NASAL | Status: DC | PRN

## 2023-02-01 MED ORDER — FLEET ENEMA 7-19 GM/118ML RE ENEM: 1.0000 | ENEMA | Freq: Once | RECTAL | Status: DC | PRN

## 2023-02-01 MED ORDER — SORBITOL 70 % SOLN: 30.0000 mL | Freq: Every day | Status: DC | PRN

## 2023-02-01 MED ORDER — SENNOSIDES-DOCUSATE SODIUM 8.6-50 MG PO TABS: 1.0000 | ORAL_TABLET | Freq: Every evening | ORAL | Status: DC | PRN

## 2023-02-01 MED ORDER — FLEET ENEMA 7-19 GM/118ML RE ENEM: 1.0000 | ENEMA | Freq: Every day | RECTAL | Status: DC | PRN

## 2023-02-01 MED ORDER — APIXABAN 5 MG PO TABS: 5.0000 mg | ORAL_TABLET | Freq: Two times a day (BID) | ORAL | Status: DC

## 2023-02-01 MED ORDER — ROSUVASTATIN CALCIUM 20 MG PO TABS: 20.0000 mg | ORAL_TABLET | Freq: Every day | ORAL | Status: DC

## 2023-02-01 MED ORDER — PROCHLORPERAZINE 25 MG RE SUPP: 12.5000 mg | Freq: Four times a day (QID) | RECTAL | Status: DC | PRN

## 2023-02-01 MED ORDER — PROCHLORPERAZINE MALEATE 5 MG PO TABS: 5.0000 mg | ORAL_TABLET | Freq: Four times a day (QID) | ORAL | Status: DC | PRN

## 2023-02-01 MED ORDER — ACETAMINOPHEN 325 MG PO TABS: 325.0000 mg | ORAL_TABLET | ORAL | Status: DC | PRN

## 2023-02-01 MED ORDER — ROSUVASTATIN CALCIUM 20 MG PO TABS
20.0000 mg | ORAL_TABLET | Freq: Every day | ORAL | Status: DC
  Administered 2023-02-02 – 2023-02-04 (×3): 20 mg via ORAL
  Filled 2023-02-01 (×3): qty 1

## 2023-02-01 MED ORDER — TRAZODONE HCL 50 MG PO TABS: 25.0000 mg | ORAL_TABLET | Freq: Every evening | ORAL | Status: DC | PRN

## 2023-02-01 MED ORDER — INFLUENZA VAC A&B SA ADJ QUAD 0.5 ML IM PRSY
0.5000 mL | PREFILLED_SYRINGE | INTRAMUSCULAR | Status: AC
  Administered 2023-02-02: 0.5 mL via INTRAMUSCULAR
  Filled 2023-02-01 (×2): qty 0.5

## 2023-02-01 MED ORDER — PROCHLORPERAZINE EDISYLATE 10 MG/2ML IJ SOLN: 5.0000 mg | Freq: Four times a day (QID) | INTRAMUSCULAR | Status: DC | PRN

## 2023-02-01 MED ORDER — FLUTICASONE PROPIONATE 50 MCG/ACT NA SUSP: 2.0000 | Freq: Every day | NASAL | 2 refills | Status: DC

## 2023-02-01 MED ORDER — APIXABAN 5 MG PO TABS
5.0000 mg | ORAL_TABLET | Freq: Two times a day (BID) | ORAL | Status: DC
  Administered 2023-02-01 – 2023-02-04 (×6): 5 mg via ORAL
  Filled 2023-02-01 (×6): qty 1

## 2023-02-01 MED ORDER — METHOCARBAMOL 500 MG PO TABS: 500.0000 mg | ORAL_TABLET | Freq: Four times a day (QID) | ORAL | Status: DC | PRN

## 2023-02-01 MED ORDER — FLUTICASONE PROPIONATE 50 MCG/ACT NA SUSP
2.0000 | Freq: Every day | NASAL | Status: DC
  Administered 2023-02-02 – 2023-02-04 (×3): 2 via NASAL
  Filled 2023-02-01: qty 16

## 2023-02-01 MED ORDER — ALUM & MAG HYDROXIDE-SIMETH 200-200-20 MG/5ML PO SUSP: 30.0000 mL | ORAL | Status: DC | PRN

## 2023-02-01 MED ORDER — QUETIAPINE FUMARATE 25 MG PO TABS
25.0000 mg | ORAL_TABLET | Freq: Every day | ORAL | Status: DC
  Administered 2023-02-01 – 2023-02-03 (×3): 25 mg via ORAL
  Filled 2023-02-01 (×3): qty 1

## 2023-02-01 NOTE — Progress Notes (Signed)
PROGRESS NOTE    Juan [name]  [id number] DOB: 04-26-1926 DOA: 01/27/2023 PCP: Pcp, No    Brief Narrative:  Juan [name] is a 89 y.o. male with past medical history of hyperlipidemia, prostate cancer and paroxysmal atrial fibrillation not on anticoagulation presented to the hospital with garbled speech, confusion and agitation.  Patient was subsequently found to CVA and admitted to the hospitalist service.  See below for further details.  Assessment/Plan  Acute CVA Embolic given history of PAF-not on anticoagulation prior to this hospitalization Echo with EF 55-60% A1c 5.9, LDL 90 CTA head/neck without LVO Evaluated by stroke MD-now on Eliquis. Awaiting CIR bed.  Delirium Improved Family at bedside-answering simple questions appropriately Continue low-dose Seroquel  Paroxysmal atrial fibrillation  Sinus rhythm Given new CVA-now on Eliquis.   CKD IIIa with metabolic acidosis. Creatinine at baseline-metabolic acidosis resolved  Sinus congestion Supportive care with intranasal steroids  Debility weakness deconditioning.   Secondary to acute illness superimposed on chronic debility CIR bed pending.   DVT prophylaxis:  apixaban (ELIQUIS) tablet 5 mg   Code Status:     Code Status: Full Code  Disposition:  PT OT has recommended CIR.  Status is: Inpatient  The patient is inpatient because: Acute stroke, need for rehabilitation.   Family Communication:  Daughter at bedside  Consultants:  Neurology  Procedures:  None  Antimicrobials:  None  Anti-infectives (From admission, onward)    None      Subjective: Awake alert-eating breakfast this morning.  No major issues overnight.  Awaiting CIR bed.  Objective: Vitals:   01/31/23 2320 02/01/23 0428 02/01/23 0800 02/01/23 1137  BP: 132/80 128/75 99/73 (!) 140/121  Pulse: 90 88    Resp: 18 20    Temp: 97.9 F (36.6 C) 98.1 F (36.7 C) (!) 97.4 F (36.3 C) 97.9 F (36.6 C)  TempSrc: Oral  Oral Oral Oral  SpO2: 98% 98%    Weight:      Height:       No intake or output data in the 24 hours ending 02/01/23 1157  Filed Weights   01/27/23 1700 01/27/23 1846  Weight: 98 kg 98 kg    Physical Examination: Body mass index is 31 kg/m.  Gen Exam:Alert awake-not in any distress HEENT:atraumatic, normocephalic Chest: B/L clear to auscultation anteriorly CVS:S1S2 regular Abdomen:soft non tender, non distended Extremities:no edema Neurology: Non focal Skin: no rash  Data Reviewed:   CBC: Recent Labs  Lab 01/27/23 1731 01/27/23 1738 01/28/23 0618 01/29/23 0345 01/30/23 0747  WBC 9.1  --  6.7 7.3 8.6  NEUTROABS 6.2  --   --   --   --   HGB 13.4 13.3 12.1* 12.4* 12.8*  HCT 38.9* 39.0 35.8* 37.1* 36.9*  MCV 105.1*  --  104.4* 103.9* 102.5*  PLT 252  --  235 238 240     Basic Metabolic Panel: Recent Labs  Lab 01/27/23 1731 01/27/23 1738 01/28/23 0618 01/29/23 0345 01/30/23 0747  NA 135 137 135 137 139  K 3.6 3.6 3.8 3.8 4.2  CL 106 105 102 107 106  CO2 16*  --  21* 22 25  GLUCOSE 126* 123* 88 99 98  BUN 19 20 13 12 12   CREATININE 1.15 1.20 1.14 1.17 1.22  CALCIUM 8.4*  --  8.2* 8.5* 8.7*  MG  --   --   --  2.1 2.0     Liver Function Tests: Recent Labs  Lab 01/27/23 1731 01/28/23 0618  AST  24 20  ALT 21 19  ALKPHOS 74 64  BILITOT 0.8 0.7  PROT 6.2* 5.5*  ALBUMIN 3.4* 2.9*      Radiology Studies: No results found.    LOS: 4 days    Oren Binet, MD [facility] Available via Epic secure chat 7am-7pm After these hours, please refer to coverage provider listed on [email or web address] 02/01/2023, 11:57 AM

## 2023-02-01 NOTE — Progress Notes (Signed)
Physical Therapy Treatment Patient Details Name: Juan [name] MRN: [id number] DOB: Mar 24, 1926 Today's Date: 02/01/2023   History of Present Illness Juan [name] is a 89 y.o. male who presents on 3/22 with garbled speech, confusion, and agitation. 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 PMHx:  hyperlipidemia, prostate cancer, and paroxysmal atrial fibrillation not anticoagulated    PT Comments    Pt with continued progress towards acute goals, however pt continued to be limited by impaired cognition and max difficulty following verbal only commands. Pt unable to follow verbal only commands this session, needing increased gesturing and tactile cues for all mobility and safety. Pt requiring min guard for gait and stair training this session with no overt LOB noted however lateral sway present despite ace compression wrap to R knee. Current plan remains appropriate to address deficits and maximize functional independence and decrease caregiver burden.Pt continues to benefit from skilled PT services to progress toward functional mobility goals.    Recommendations for follow up therapy are one component of a multi-disciplinary discharge planning process, led by the attending physician.  Recommendations may be updated based on patient status, additional functional criteria and insurance authorization.     Assistance Recommended at Discharge Frequent or constant Supervision/Assistance  Patient can return home with the following A little help with walking and/or transfers;Assist for transportation;Help with stairs or ramp for entrance   Equipment Recommendations  Other (comment) (will continue to assess)    Recommendations for Other Services       Precautions / Restrictions Precautions Precautions: Fall Restrictions Weight Bearing Restrictions: No     Mobility  Bed  Mobility Overal bed mobility: Needs Assistance             General bed mobility comments: pt OOB pre and post session    Transfers Overall transfer level: Needs assistance Equipment used: None Transfers: Sit to/from Stand Sit to Stand: Supervision           General transfer comment: supervision for safety    Ambulation/Gait Ambulation/Gait assistance: Min assist Gait Distance (Feet): 300 Feet Assistive device: None Gait Pattern/deviations: Step-through pattern, Wide base of support, Drifts right/left, Decreased stride length, Trunk flexed Gait velocity: decreased     General Gait Details: wide BOS and increased lateral sway, minA provided for balance and navigation, no overt LOB but imbalance noted with pt reaching for support of furniture in room   Stairs Stairs: Yes Stairs assistance: Min guard Stair Management: One rail Right, Step to pattern, Forwards Number of Stairs: 12 (flight) General stair comments: up/down without fault, increased gestures and tactile cues for safety and technique   Wheelchair Mobility    Modified Rankin (Stroke Patients Only) Modified Rankin (Stroke Patients Only) Pre-Morbid Rankin Score: No symptoms Modified Rankin: Moderately severe disability     Balance Overall balance assessment: Needs assistance Sitting-balance support: No upper extremity supported, Feet supported Sitting balance-Leahy Scale: Good     Standing balance support: No upper extremity supported, During functional activity Standing balance-Leahy Scale: Fair Standing balance comment: min guard no LOB, reaches for furniture/rail when available                            Cognition Arousal/Alertness: Awake/alert Behavior During Therapy: Restless Overall Cognitive Status: Difficult to assess Area of Impairment: Following commands  Following Commands:  (delayed response speed, multimodal cues)       General  Comments: Difficulty following verbal commands but doing well with gestures.        Exercises      General Comments General comments (skin integrity, edema, etc.): VSS on RA, pt limited by impaired communication and ability to follow verbal commands, re-wrapped knee with ace wrap to provide compression with pt endorsing improvement      Pertinent Vitals/Pain Pain Assessment Pain Assessment: Faces Faces Pain Scale: Hurts a little bit Pain Location: L knee Pain Descriptors / Indicators: Guarding, Grimacing Pain Intervention(s): Monitored during session, Limited activity within patient's tolerance    Home Living                          Prior Function            PT Goals (current goals can now be found in the care plan section) Acute Rehab PT Goals Patient Stated Goal: none stated by pt PT Goal Formulation: With family Time For Goal Achievement: 02/11/23 Progress towards PT goals: Progressing toward goals    Frequency    Min 3X/week      PT Plan      Co-evaluation              AM-PAC PT "6 Clicks" Mobility   Outcome Measure  Help needed turning from your back to your side while in a flat bed without using bedrails?: A Little Help needed moving from lying on your back to sitting on the side of a flat bed without using bedrails?: A Little Help needed moving to and from a bed to a chair (including a wheelchair)?: A Little Help needed standing up from a chair using your arms (e.g., wheelchair or bedside chair)?: A Little Help needed to walk in hospital room?: A Little Help needed climbing 3-5 steps with a railing? : A Little 6 Click Score: 18    End of Session Equipment Utilized During Treatment: Gait belt Activity Tolerance: Patient tolerated treatment well Patient left: in chair;with call bell/phone within reach;with family/visitor present Nurse Communication: Mobility status PT Visit Diagnosis: Difficulty in walking, not elsewhere classified  (R26.2);Unsteadiness on feet (R26.81)     Time: [id number] PT Time Calculation (min) (ACUTE ONLY): 11 min  Charges:  $Therapeutic Activity: 8-22 mins                     Tyvon Eggenberger R. PTA Acute Rehabilitation Services Office: [place] 02/01/2023, 2:24 PM

## 2023-02-01 NOTE — Plan of Care (Signed)

## 2023-02-01 NOTE — TOC CAGE-AID Note (Signed)
Transition of Care [facility]) - CAGE-AID Screening   Patient Details  Name: Juan [name] MRN: [id number] Date of Birth: 02/29/1926  [facility] of [facility]) CM/SW Contact:    Benard Halsted, LCSW Phone Number: 02/01/2023, 1:05 PM   Clinical Narrative: Patient reports only recreational ETOH use. Resources not warranted at this time.    CAGE-AID Screening:    Have You Ever Felt You Ought to Cut Down on Your Drinking or Drug Use?: No Have People Annoyed You By Critizing Your Drinking Or Drug Use?: No Have You Felt Bad Or Guilty About Your Drinking Or Drug Use?: No Have You Ever Had a Drink or Used Drugs First Thing In The Morning to Steady Your Nerves or to Get Rid of a Hangover?: No CAGE-AID Score: 0  Substance Abuse Education Offered: No

## 2023-02-01 NOTE — Progress Notes (Signed)
Inpatient Rehab Admissions Coordinator:    I have insurance approval and a bed available for pt to admit to CIR today. Dr. Sloan Leiter in agreement.  Will let pt/family and TOC team know.   Shann Medal, PT, DPT Admissions Coordinator [phone] 02/01/23  1:05 PM

## 2023-02-01 NOTE — Discharge Summary (Addendum)
PATIENT DETAILS Name: Juan [name] Age: 89 y.o. Sex: male Date of Birth: Nov 14, 1925 MRN: [id number]. Admitting Physician: [name], MD PCP:Pcp, No  Admit Date: 01/27/2023 Discharge date: 02/01/2023  Recommendations for Outpatient Follow-up:  Follow up with PCP in 1-2 weeks Please obtain CMP/CBC in one week Please ensure follow up with Neurology  Admitted From:  Home  Disposition: CIR   Discharge Condition: fair  CODE STATUS:   Code Status: Full Code   Diet recommendation:  Diet Order             Diet - low sodium heart healthy           Diet regular Fluid consistency: Thin  Diet effective now                    Brief Summary: Juan [name] is a 89 y.o. male with past medical history of hyperlipidemia, prostate cancer and paroxysmal atrial fibrillation not on anticoagulation presented to the hospital with garbled speech, confusion and agitation.  Patient was subsequently found to CVA and admitted to the hospitalist service.  See below for further details.    Brief Hospital Course: Acute CVA Embolic given history of PAF-not on anticoagulation prior to this hospitalization Echo with EF 55-60% A1c 5.9, LDL 90 CTA head/neck without LVO Evaluated by stroke MD-now on Eliquis. Being discharged to CIR today. Please ensure follow with Stroke clinic post discharge   Delirium Improved Family at bedside-answering simple questions appropriately Continue low-dose Seroquel   Paroxysmal atrial fibrillation  Sinus rhythm Given new CVA-now on Eliquis.   CKD IIIa with metabolic acidosis. Creatinine at baseline-metabolic acidosis resolved   Sinus congestion Supportive care with intranasal steroids   Debility weakness deconditioning.   Secondary to acute illness superimposed on chronic debility Being discharged to CIR today.   Obesity: Estimated body mass index is 31 kg/m as calculated from the following:   Height as of this encounter: 5\' 10"   (1.778 m).   Weight as of this encounter: 98 kg.    Discharge Diagnoses:  Principal Problem:   Acute encephalopathy Active Problems:   PAF (paroxysmal atrial fibrillation) (HCC)   Hyperlipidemia   Acidosis   Stage 3a chronic kidney disease (CKD) (HCC)   Acute CVA (cerebrovascular accident) [facility])   Discharge Instructions:  Activity:  As tolerated with Full fall precautions use walker/cane & assistance as needed  Discharge Instructions     Ambulatory referral to Neurology   Complete by: As directed    An appointment is requested in approximately: 8 weeks   Diet - low sodium heart healthy   Complete by: As directed    Increase activity slowly   Complete by: As directed       Allergies as of 02/01/2023   No Known Allergies      Medication List     STOP taking these medications    OVER THE COUNTER MEDICATION       TAKE these medications    apixaban 5 MG Tabs tablet Commonly known as: ELIQUIS Take 1 tablet (5 mg total) by mouth 2 (two) times daily.   fluticasone 50 MCG/ACT nasal spray Commonly known as: FLONASE Place 2 sprays into both nostrils daily.   QUEtiapine 25 MG tablet Commonly known as: SEROQUEL Take 1 tablet (25 mg total) by mouth at bedtime.   rosuvastatin 20 MG tablet Commonly known as: CRESTOR Take 1 tablet (20 mg total) by mouth daily. Start taking on: February 02, 2023  Follow-up Information     Garvin Fila, MD Follow up.   Specialties: Neurology, Radiology Why: Hospital follow up, Office will call with date/time, If you dont hear from them,please give them a call Contact information: [place] [id number] [phone]         Primary care MD. Schedule an appointment as soon as possible for a visit in 2 week(s).                 No Known Allergies   Other Procedures/Studies: ECHOCARDIOGRAM COMPLETE  Result Date: 01/28/2023    ECHOCARDIOGRAM REPORT   Patient Name:   Juan [name] Date of Exam: 01/28/2023 Medical Rec #:  [id number]         Height:       70.0 in Accession #:    [id number]        Weight:       216.0 lb Date of Birth:  06/15/1926        BSA:          2.157 m Patient Age:    33 years          BP:           145/83 mmHg Patient Gender: M                 HR:           89 bpm. Exam Location:  Inpatient Procedure: 2D Echo, Cardiac Doppler and Color Doppler Indications:    stroke  History:        Patient has no prior history of Echocardiogram examinations.                 Chronic kidney disease. Cancer; Arrythmias:Paroxysmal A-fib.  Sonographer:    Johny Chess RDCS Referring Phys: [id number] TIMOTHY S OPYD  Sonographer Comments: Image acquisition challenging due to uncooperative patient. IMPRESSIONS  1. Left ventricular ejection fraction, by estimation, is 55 to 60%. The left ventricle has normal function. The left ventricle has no regional wall motion abnormalities. There is mild concentric left ventricular hypertrophy. Left ventricular diastolic parameters are indeterminate.  2. Right ventricular systolic function is normal. The right ventricular size is normal. There is moderately elevated pulmonary artery systolic pressure. The estimated right ventricular systolic pressure is 123XX123 mmHg.  3. Left atrial size was mildly dilated.  4. The mitral valve is grossly normal. Mild mitral valve regurgitation.  5. The aortic valve is tricuspid. There is mild calcification of the aortic valve. Aortic valve regurgitation is mild. Aortic valve sclerosis/calcification is present, without any evidence of aortic stenosis.  6. Aortic dilatation noted. There is mild dilatation of the ascending aorta, measuring 40 mm.  7. The inferior vena cava is dilated in size with <50% respiratory variability, suggesting right atrial pressure of 15 mmHg. Comparison(s): No prior Echocardiogram. FINDINGS  Left Ventricle: Left ventricular ejection fraction, by estimation, is 55 to 60%. The left ventricle has  normal function. The left ventricle has no regional wall motion abnormalities. The left ventricular internal cavity size was normal in size. There is  mild concentric left ventricular hypertrophy. Left ventricular diastolic function could not be evaluated due to atrial fibrillation. Left ventricular diastolic parameters are indeterminate. Right Ventricle: The right ventricular size is normal. No increase in right ventricular wall thickness. Right ventricular systolic function is normal. There is moderately elevated pulmonary artery systolic pressure. The tricuspid regurgitant velocity is 3.28 m/s, and with an assumed right atrial  pressure of 15 mmHg, the estimated right ventricular systolic pressure is 123XX123 mmHg. Left Atrium: Left atrial size was mildly dilated. Right Atrium: Right atrial size was normal in size. Pericardium: There is no evidence of pericardial effusion. Presence of epicardial fat layer. Mitral Valve: The mitral valve is grossly normal. Mild mitral annular calcification. Mild mitral valve regurgitation. Tricuspid Valve: The tricuspid valve is grossly normal. Tricuspid valve regurgitation is mild. Aortic Valve: The aortic valve is tricuspid. There is mild calcification of the aortic valve. There is mild to moderate aortic valve annular calcification. Aortic valve regurgitation is mild. Aortic valve sclerosis/calcification is present, without any evidence of aortic stenosis. Pulmonic Valve: The pulmonic valve was grossly normal. Pulmonic valve regurgitation is trivial. Aorta: Aortic dilatation noted. There is mild dilatation of the ascending aorta, measuring 40 mm. Venous: The inferior vena cava is dilated in size with less than 50% respiratory variability, suggesting right atrial pressure of 15 mmHg. IAS/Shunts: No atrial level shunt detected by color flow Doppler.  LEFT VENTRICLE PLAX 2D LVIDd:         5.00 cm LVIDs:         3.50 cm LV PW:         1.10 cm LV IVS:        1.10 cm LVOT diam:     2.30  cm LVOT Area:     4.15 cm  RIGHT VENTRICLE          IVC RV Basal diam:  3.40 cm  IVC diam: 3.00 cm TAPSE (M-mode): 1.4 cm LEFT ATRIUM              Index        RIGHT ATRIUM           Index LA diam:        5.30 cm  2.46 cm/m   RA Area:     23.80 cm LA Vol (A2C):   122.0 ml 56.56 ml/m  RA Volume:   73.10 ml  33.89 ml/m LA Vol (A4C):   48.1 ml  22.30 ml/m LA Biplane Vol: 78.1 ml  36.21 ml/m   AORTA Ao Root diam: 3.80 cm Ao Asc diam:  4.00 cm TRICUSPID VALVE TR Peak grad:   43.0 mmHg TR Vmax:        328.00 cm/s  SHUNTS Systemic Diam: 2.30 cm Rozann Lesches MD Electronically signed by Rozann Lesches MD Signature Date/Time: 01/28/2023/12:46:04 PM    Final    EEG adult  Result Date: 01/28/2023 Lora Havens, MD     01/28/2023 10:06 AM Patient Name: Juan [name] MRN: [id number] Epilepsy Attending: Lora Havens Referring Physician/Provider: [name], MD Date: 01/28/2023 Duration: 26.39 mins Patient history: 89yo M with ams. EEG to evaluate for seizure Level of alertness: Awake AEDs during EEG study: Ativan Technical aspects: This EEG study was done with scalp electrodes positioned according to the 10-20 International system of electrode placement. Electrical activity was reviewed with band pass filter of 1-70Hz , sensitivity of 7 uV/mm, display speed of 38mm/sec with a 60Hz  notched filter applied as appropriate. EEG data were recorded continuously and digitally stored.  Video monitoring was available and reviewed as appropriate. Description: The posterior dominant rhythm consists of 8 Hz activity of moderate voltage (25-35 uV) seen predominantly in posterior head regions, symmetric and reactive to eye opening and eye closing. EEG showed continuous 3 to 6 Hz theta-delta slowing in left temporal region. Hyperventilation and photic stimulation were not performed.   ABNORMALITY -  Continuous slow, left temporal region IMPRESSION: This study is suggestive of cortical dysfunction arising from left  temporal region likely secondary to underlying structural abnormality. No seizures or epileptiform discharges were seen throughout the recording. Lora Havens   MR BRAIN WO CONTRAST  Result Date: 01/28/2023 CLINICAL DATA:  Follow-up examination for stroke. EXAM: MRI HEAD WITHOUT CONTRAST TECHNIQUE: Multiplanar, multiecho pulse sequences of the brain and surrounding structures were obtained without intravenous contrast. COMPARISON:  Prior CTs from 01/27/2023. FINDINGS: Brain: Examination is technically limited as the patient was unable to tolerate the full length of the study. Diffusion-weighted sequences and sagittal T1 weighted sequence only were performed. Age-related cerebral atrophy. Encephalomalacia at the anterior right frontal lobe consistent with a chronic right MCA territory infarct. Confluent restricted diffusion involving the left temporal lobe and temporal occipital region, consistent with an acute left MCA territory infarct (series 5, image 69). Mild patchy involvement of the left insular cortex. Additional small volume cortical involvement at the high posterior left parietal lobe noted (series 5, image 81). Additional restricted diffusion involving the superior left cerebellar hemisphere, consistent with an evolving left superior cerebellar artery infarct (series 5, image 60). Few additional punctate cortical infarcts noted at the right occipital lobe (series 5, images 64, 66). Additional punctate right cerebellar infarct (series 5, image 56). Overall, given the various vascular distributions involved, a central thromboembolic etiology is suspected. No significant mass effect. Evaluation for associated blood products limited on this limited exam, however, there are suspected associated petechial blood products at the left temporal lobe (series 5, image 69). No associated hemorrhage visible on prior CT. No other acute or subacute ischemia. Gray-white matter differentiation otherwise grossly  maintained. No visible mass lesion. No significant mass effect or midline shift. No hydrocephalus. No extra-axial fluid collection. Pituitary gland and suprasellar region grossly within normal limits. Vascular: Major intracranial vascular flow voids are not well assessed on this limited exam. Skull and upper cervical spine: Craniocervical junction grossly within normal limits. Bone marrow signal intensity within normal limits. No scalp soft tissue abnormality. Sinuses/Orbits: Globes orbital soft tissues demonstrate no obvious abnormality. Chronic paranasal sinus disease, better appreciated on prior CT. Other: None. IMPRESSION: 1. Technically limited and truncated exam due to the patient's inability to tolerate the full length of the study. 2. Moderate-sized acute left MCA territory infarct involving the left temporal lobe/temporoccipital region. Suspected associated petechial blood products at the left temporal lobe. 3. Additional acute left superior cerebellar artery infarct, with a few additional punctate infarcts involving the right occipital lobe and right cerebellum. Given the various vascular distributions involved, a central thromboembolic etiology is suspected. 4. Chronic right frontal lobe infarct, right MCA distribution. Electronically Signed   By: Jeannine Boga M.D.   On: 01/28/2023 03:52   CT ANGIO HEAD NECK W WO CM W PERF (CODE STROKE)  Result Date: 01/27/2023 CLINICAL DATA:  Stroke suspected EXAM: CT ANGIOGRAPHY HEAD AND NECK TECHNIQUE: Multidetector CT imaging of the head and neck was performed using the standard protocol during bolus administration of intravenous contrast. Multiplanar CT image reconstructions and MIPs were obtained to evaluate the vascular anatomy. Carotid stenosis measurements (when applicable) are obtained utilizing NASCET criteria, using the distal internal carotid diameter as the denominator. RADIATION DOSE REDUCTION: This exam was performed according to the  departmental dose-optimization program which includes automated exposure control, adjustment of the mA and/or kV according to patient size and/or use of iterative reconstruction technique. CONTRAST:  121mL OMNIPAQUE IOHEXOL 350 MG/ML SOLN COMPARISON:  No prior  CTA available, correlation is made with 01/27/2023 CT head FINDINGS: Evaluation is significantly limited by motion artifact. CT HEAD FINDINGS For noncontrast findings, please see same day CT head. CTA NECK FINDINGS Aortic arch: Two-vessel arch with a common origin of the brachiocephalic and left common carotid arteries. Imaged portion shows no evidence of aneurysm or dissection. No significant stenosis of the major arch vessel origins. Aortic atherosclerosis. Right carotid system: No definite stenosis at the bifurcation or in the ICA. Left carotid system: No definite stenosis at the bifurcation or in the ICA. Vertebral arteries: Evaluation of the V1 and majority of the V2 segments is limited by motion. No significant stenosis in the distal V2 or V3 segment. Skeleton: No definite acute osseous abnormality, although evaluation is motion limited. Other neck: No acute finding. Upper chest: No focal pulmonary opacity or pleural effusion. Review of the MIP images confirms the above findings CTA HEAD FINDINGS Anterior circulation: Both internal carotid arteries are patent to the termini, with calcifications but without significant stenosis. A1 segments patent. Normal anterior communicating artery. Patent A 2 segments. More distal ACA segments are unable to be evaluated due to motion. No M1 stenosis or occlusion. Proximal M2 segments are patent. Evaluation of more distal MCA branches is limited by motion, however there is a suggestion of slightly diminished opacification of the anterior left MCA branches compared to anterior right MCA branches (series 19, image 19). Posterior circulation: Vertebral arteries patent to the vertebrobasilar junction with mild stenosis in  the mid to distal V4. Basilar patent to its distal aspect, with mild stenosis in the mid to distal basilar artery. Superior cerebellar arteries patent proximally. Patent P1 segments. Patent P2 segments. More distal PCA segment evaluation is limited by motion. Venous sinuses: As permitted by contrast timing, patent. Anatomic variants: None significant. Review of the MIP images confirms the above findings IMPRESSION: 1. Significantly motion degraded exam, with no definite proximal intracranial large vessel occlusion. There is a suggestion of slightly diminished opacification of the anterior left MCA branches compared to the anterior right MCA branches. 2. No hemodynamically significant stenosis in the neck. 3. Aortic atherosclerosis. Aortic Atherosclerosis (ICD10-I70.0). These findings were discussed by telephone on 01/27/2023 at 6:20 pm with provider ASHISH ARORA . Electronically Signed   By: Merilyn Baba M.D.   On: 01/27/2023 18:39   CT HEAD CODE STROKE WO CONTRAST  Result Date: 01/27/2023 CLINICAL DATA:  Code stroke.  Altered mental status EXAM: CT HEAD WITHOUT CONTRAST TECHNIQUE: Contiguous axial images were obtained from the base of the skull through the vertex without intravenous contrast. RADIATION DOSE REDUCTION: This exam was performed according to the departmental dose-optimization program which includes automated exposure control, adjustment of the mA and/or kV according to patient size and/or use of iterative reconstruction technique. COMPARISON:  None Available. FINDINGS: Brain: Ill-defined hypodensity in the left superior cerebellum (series 6, image 55), which could indicate an acute or subacute infarct. Encephalomalacia in the right frontal lobe is favored to represent sequela of a more remote infarct (series 4, image 15). No evidence of acute hemorrhage, mass, mass effect, or midline shift. No hydrocephalus or extra-axial collection. Vascular: No hyperdense vessel. Atherosclerotic calcifications in  the intracranial carotid and vertebral arteries. Skull: Negative for fracture or focal lesion. Sinuses/Orbits: Mucosal thickening in the maxillary sinuses, frontal sinuses, and ethmoid air cells. Status post bilateral lens replacements. Other: The mastoid air cells are well aerated. ASPECTS [facility] Stroke Program Early CT Score) - Ganglionic level infarction (caudate, lentiform nuclei, internal capsule, insula,  M1-M3 cortex): 7 - Supraganglionic infarction (M4-M6 cortex): 3 Total score (0-10 with 10 being normal): 10 IMPRESSION: 1. Ill-defined hypodensity in the left superior cerebellum, which could indicate acute to subacute infarct. 2. Encephalomalacia in the right frontal lobe favored to represent sequela of a more remote infarct. 3. No hemorrhage. Imaging results were communicated on 01/27/2023 at 5:47 pm to provider Dr. Rory Percy via secure text paging. Electronically Signed   By: Merilyn Baba M.D.   On: 01/27/2023 17:48     TODAY-DAY OF DISCHARGE:  Subjective:   Trude Mcburney today has no headache,no chest abdominal pain,no new weakness tingling or numbness, feels much better wants to go home today.   Objective:   Blood pressure (!) 140/121, pulse 88, temperature 97.9 F (36.6 C), temperature source Oral, resp. rate 20, height 5\' 10"  (1.778 m), weight 98 kg, SpO2 98 %. No intake or output data in the 24 hours ending 02/01/23 1308 Filed Weights   01/27/23 1700 01/27/23 1846  Weight: 98 kg 98 kg    Exam: Awake Alert, Oriented *3, No new F.N deficits, Normal affect [place].AT,PERRAL Supple Neck,No JVD, No cervical lymphadenopathy appriciated.  Symmetrical Chest wall movement, Good air movement bilaterally, CTAB RRR,No Gallops,Rubs or new Murmurs, No Parasternal Heave +ve B.Sounds, Abd Soft, Non tender, No organomegaly appriciated, No rebound -guarding or rigidity. No Cyanosis, Clubbing or edema, No new Rash or bruise   PERTINENT RADIOLOGIC STUDIES: No results found.   PERTINENT LAB  RESULTS: CBC: Recent Labs    01/30/23 0747  WBC 8.6  HGB 12.8*  HCT 36.9*  PLT 240   CMET CMP     Component Value Date/Time   NA 139 01/30/2023 0747   K 4.2 01/30/2023 0747   CL 106 01/30/2023 0747   CO2 25 01/30/2023 0747   GLUCOSE 98 01/30/2023 0747   BUN 12 01/30/2023 0747   CREATININE 1.22 01/30/2023 0747   CALCIUM 8.7 (L) 01/30/2023 0747   PROT 5.5 (L) 01/28/2023 0618   ALBUMIN 2.9 (L) 01/28/2023 0618   AST 20 01/28/2023 0618   ALT 19 01/28/2023 0618   ALKPHOS 64 01/28/2023 0618   BILITOT 0.7 01/28/2023 0618   GFRNONAA 55 (L) 01/30/2023 0747   GFRAA  04/11/2011 0500    >60        The eGFR has been calculated using the MDRD equation. This calculation has not been validated in all clinical situations. eGFR's persistently <60 mL/min signify possible Chronic Kidney Disease.    GFR Estimated Creatinine Clearance: 43.5 mL/min (by C-G formula based on SCr of 1.22 mg/dL). No results for input(s): "LIPASE", "AMYLASE" in the last 72 hours. No results for input(s): "CKTOTAL", "CKMB", "CKMBINDEX", "TROPONINI" in the last 72 hours. Invalid input(s): "POCBNP" No results for input(s): "DDIMER" in the last 72 hours. No results for input(s): "HGBA1C" in the last 72 hours. No results for input(s): "CHOL", "HDL", "LDLCALC", "TRIG", "CHOLHDL", "LDLDIRECT" in the last 72 hours. No results for input(s): "TSH", "T4TOTAL", "T3FREE", "THYROIDAB" in the last 72 hours.  Invalid input(s): "FREET3" No results for input(s): "VITAMINB12", "FOLATE", "FERRITIN", "TIBC", "IRON", "RETICCTPCT" in the last 72 hours. Coags: No results for input(s): "INR" in the last 72 hours.  Invalid input(s): "PT" Microbiology: No results found for this or any previous visit (from the past 240 hour(s)).  FURTHER DISCHARGE INSTRUCTIONS:  Get Medicines reviewed and adjusted: Please take all your medications with you for your next visit with your Primary MD  Laboratory/radiological data: Please  request your Primary MD to  go over all hospital tests and procedure/radiological results at the follow up, please ask your Primary MD to get all Hospital records sent to his/her office.  In some cases, they will be blood work, cultures and biopsy results pending at the time of your discharge. Please request that your primary care M.D. goes through all the records of your hospital data and follows up on these results.  Also Note the following: If you experience worsening of your admission symptoms, develop shortness of breath, life threatening emergency, suicidal or homicidal thoughts you must seek medical attention immediately by calling 911 or calling your MD immediately  if symptoms less severe.  You must read complete instructions/literature along with all the possible adverse reactions/side effects for all the Medicines you take and that have been prescribed to you. Take any new Medicines after you have completely understood and accpet all the possible adverse reactions/side effects.   Do not drive when taking Pain medications or sleeping medications (Benzodaizepines)  Do not take more than prescribed Pain, Sleep and Anxiety Medications. It is not advisable to combine anxiety,sleep and pain medications without talking with your primary care practitioner  Special Instructions: If you have smoked or chewed Tobacco  in the last 2 yrs please stop smoking, stop any regular Alcohol  and or any Recreational drug use.  Wear Seat belts while driving.  Please note: You were cared for by a hospitalist during your hospital stay. Once you are discharged, your primary care physician will handle any further medical issues. Please note that NO REFILLS for any discharge medications will be authorized once you are discharged, as it is imperative that you return to your primary care physician (or establish a relationship with a primary care physician if you do not have one) for your post hospital discharge needs  so that they can reassess your need for medications and monitor your lab values.  Total Time spent coordinating discharge including counseling, education and face to face time equals greater than 30 minutes.  Signed: Nasario Czerniak 02/01/2023 1:08 PM

## 2023-02-01 NOTE — Progress Notes (Signed)
Patient laying in bed, eyes closed. Patient's daughter at bedside.  Daughter  Orene Desanctis) refused bed alarm on, stated she will stay the whole night and will watch her father. Education provided for safety, but daughter wanted bed alarm off.

## 2023-02-01 NOTE — H&P (Addendum)
Physical Medicine and Rehabilitation Admission H&P    Chief Complaint  Patient presents with   Stroke with functional deficits    HPI: Juan [name] is a 89 year old male with history of prostate cancer, CKD  IIIa, PAF who was admitted on 01/27/23 with garbled speech, confusion and agitation.  He was on aspirin prior to admission.  Last seen normal on 03/18, he was combative, had metabolic acidosis and needed Versed. He was treated with IVF and CT head done showing diminished opacification anterior L-MCA branches.  MRI brain done showing moderate acute L-MCA territory involving left temporal and temporo-occipital region with suspected petechial blood products in left temporal lobe and acute left cerebellar infarct with few punctate areas in right occipital and right cerebellum felt to be central thromboembolic in origin. 2D echo showed EF 55-60% with aortic dilatation and mild to moderate aortic calcification. He was loaded with Keppra, started on ASA and EEG done which showed slowing in left temporal region was negative for seizures.  He has had issues with agitation requiring Haldol and low dose seroquel added to help with sleep. His verbal output improving with increased ability to follow gestures. He was transitioned to Eliquis.    Therapy ongoing and patient with mixed expressive receptive aphasia with ability to follow commands with multimodal cues, has balance deficits and delay in reponses. CIR recommended due to functional decline.     Review of Systems  Unable to perform ROS: Language    Past Medical History:  Diagnosis Date   Arrhythmia    Diverticulitis 2004   Hyperlipidemia    Prostate cancer ([place])    Shingles outbreak    LEFT CHEST WALL    Past Surgical History:  Procedure Laterality Date   APPENDECTOMY      Family History  Problem Relation Age of Onset   Cancer Sister    Cancer Brother     Social History:  Lives alone. 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 He reports that he has never smoked. He does not have any smokeless tobacco history on file. He reports that he does not use drugs. He drinks a glass of wine at nights.    Allergies: No Known Allergies   Medications Prior to Admission  Medication Sig Dispense Refill   apixaban (ELIQUIS) 5 MG TABS tablet Take 1 tablet (5 mg total) by mouth 2 (two) times daily. 60 tablet    fluticasone (FLONASE) 50 MCG/ACT nasal spray Place 2 sprays into both nostrils daily.  2   QUEtiapine (SEROQUEL) 25 MG tablet Take 1 tablet (25 mg total) by mouth at bedtime.     [START ON 02/02/2023] rosuvastatin (CRESTOR) 20 MG tablet Take 1 tablet (20 mg total) by mouth daily.      Home: Home Living Family/patient expects to be discharged to:: Private residence Living Arrangements: Alone Available Help at Discharge: Family, Available PRN/intermittently Type of Home: House Home Access: [place]: One level Bathroom Shower/Tub: Multimedia programmer: [place]: Other (comment), None Additional Comments: Pt has children who can assist intermittently, daughter at bedside reporting potential for pt to move in with her if needed  Lives With: Alone   Functional History: Prior Function Prior Level of Function : Independent/Modified Independent, Driving Mobility Comments: independent with all mobility, no use of AD ADLs Comments: indep including IADLs   Functional Status:  Mobility: Bed Mobility Overal bed mobility: Needs Assistance Bed Mobility: Supine  to Sit Supine to sit: HOB elevated, Min guard Sit to supine: Supervision General bed mobility comments: pt OOB pre and post session Transfers Overall transfer level: Needs assistance Equipment used: None Transfers: Sit to/from Stand Sit to Stand: Supervision General transfer comment: supervision for safety Ambulation/Gait Ambulation/Gait assistance: Min assist Gait Distance (Feet):  300 Feet Assistive device: None Gait Pattern/deviations: Step-through pattern, Wide base of support, Drifts right/left, Decreased stride length, Trunk flexed General Gait Details: wide BOS and increased lateral sway, minA provided for balance and navigation, no overt LOB but imbalance noted with pt reaching for support of furniture in room Gait velocity: decreased Stairs: Yes Stairs assistance: Min guard Stair Management: One rail Right, Step to pattern, Forwards Number of Stairs: 12 (flight) General stair comments: up/down without fault, increased gestures and tactile cues for safety and technique   ADL: ADL Overall ADL's : Needs assistance/impaired Eating/Feeding: Set up, Sitting Grooming: Oral care, Standing, Supervision/safety, Wash/dry hands Grooming Details (indicate cue type and reason): thorough, sequenced independently Upper Body Bathing: Minimal assistance, Sitting Lower Body Bathing: Minimal assistance, Sit to/from stand Upper Body Dressing : Sitting, Set up, Min guard Lower Body Dressing: Set up, Sitting/lateral leans Toilet Transfer: Min guard, Ambulation Toileting- Clothing Manipulation and Hygiene: Supervision/safety Functional mobility during ADLs: Min guard General ADL Comments: visual and tactile cues needed to initiate all tasks   Cognition: Cognition Overall Cognitive Status: Difficult to assess Arousal/Alertness: Awake/alert Orientation Level: Oriented to person, Oriented to place, Disoriented to time, Disoriented to situation Comments: cognition difficult to assess secondary to patient's severe aphasia. Cognition Arousal/Alertness: Awake/alert Behavior During Therapy: Restless Overall Cognitive Status: Difficult to assess Area of Impairment: Following commands Following Commands:  (delayed response speed, multimodal cues) General Comments: Difficulty following verbal commands but doing well with gestures. Difficult to assess due to: Impaired  communication    Blood pressure 131/84, pulse 87, temperature 97.8 F (36.6 C), temperature source Oral, resp. rate 18, height 5\' 10"  (1.778 m), weight 94.5 kg, SpO2 98 %.  General: Sitting in chair,  No apparent distress HEENT: Head is normocephalic, atraumatic, PERRLA, EOMI, sclera anicteric, oral mucosa pink and moist, dentition intact, ext ear canals clear,  Neck: Supple without JVD or lymphadenopathy Heart: Reg rate and rhythm, No murmurs rubs or gallops Chest: CTA bilaterally without wheezes, rales, or rhonchi; no distress Abdomen: Soft, non-tender, non-distended, bowel sounds positive. Extremities: No clubbing, cyanosis, or edema. Pulses are 2+ Psych: Pt's affect is appropriate. Pt is cooperative Skin: Clean and intact without signs of breakdown Neuro: Oriented to person, unable to provide any answer for place, time or situation.  Next aphasia present appears more receptive and expressive, speech is mostly fluent although garbled at times.  Echolaly noted and needs visual cues to follow simple motor commands.  Cranial nerves II through XII intact, unable to name 3 objects-tries to describe how they are used, unable to repeat Strength at least 4 out of 5 in bilateral upper extremities Strength at least 4  out of 5 in bilateral lower extremities Sensation intact light touch in bilateral lower extremities and upper extremities Coordination difficult to assess however no large abnormalities were noted in UEs Musculoskeletal: Normal muscle bulk, no abnormal tone noted.  No joint swelling noted Arthritic changes noted in his fingers right greater than left  IV in right upper extremity looks okay  No results found for this or any previous visit (from the past 48 hour(s)). No results found.    Blood pressure 131/84, pulse 87, temperature 97.8  F (36.6 C), temperature source Oral, resp. rate 18, height 5\' 10"  (1.778 m), weight 94.5 kg, SpO2 98 %.  Medical Problem List and Plan: 1.  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  Antithrombotics: -DVT/anticoagulation:  Pharmaceutical: Eliquis  -antiplatelet therapy: N/A 3. Pain Management:  Tylenol prn.  4. Mood/Behavior/Sleep: LCSW to follow for evaluation and support when appropraite  --Continue Seroquel--may need to increase to 50 mg as off haldol now.   -antipsychotic agents: N/A 5. Neuropsych/cognition: This patient is not capable of making decisions on his own behalf. 6. Skin/Wound Care: Routine pressure relief measures.  7. Fluids/Electrolytes/Nutrition: Monitor I/O. Check CMET in am 8. PAF: Monitor HR TID and for symptoms with increase in activity. --continue Eliquis BID.  9. Delirium. Has been getting Haldol 2 mg IV at bedtime since admission for agitation  --sleep wake chart.   --family to spend nights to transition patient to CIR.  10. HTN: Long-term goal normotensive.  Monitor with activity 11.  Hyperlipidemia: Continue Crestor 20 mg 12.  CKD IIIa.  [place], PA-C 02/01/2023  I have personally performed a face to face diagnostic evaluation of this patient and formulated the key components of the plan.  Additionally, I have personally reviewed laboratory data, imaging studies, as well as relevant notes and concur with the physician assistant's documentation above.  The patient's status has not changed from the original H&P.  Any changes in documentation from the acute care chart have been noted above.  Jennye Boroughs, MD, [facility]

## 2023-02-01 NOTE — Progress Notes (Signed)
Inpatient Rehabilitation Admission Medication Review by a Pharmacist  A complete drug regimen review was completed for this patient to identify any potential clinically significant medication issues.  High Risk Drug Classes Is patient taking? Indication by Medication  Antipsychotic Yes Seroquel - agitation Compazine prn N/V  Anticoagulant Yes Eliquis - CVA, PAF  Antibiotic No   Opioid No   Antiplatelet No   Hypoglycemics/insulin No   Vasoactive Medication No   Chemotherapy No   Other Yes Robaxin - prn spasms Trazodone - prn sleep Rosuvastatin - HLD     Type of Medication Issue Identified Description of Issue Recommendation(s)  Drug Interaction(s) (clinically significant)     Duplicate Therapy     Allergy     No Medication Administration End Date     Incorrect Dose     Additional Drug Therapy Needed     Significant med changes from prior encounter (inform family/care partners about these prior to discharge).    Other       Clinically significant medication issues were identified that warrant physician communication and completion of prescribed/recommended actions by midnight of the next day:  No  Pharmacist comments: Eliquis education complete / co-pay check complete  Time spent performing this drug regimen review (minutes):  20 minutes  Thank you Anette Guarneri, PharmD

## 2023-02-01 NOTE — Progress Notes (Addendum)
PMR Admission Coordinator Pre-Admission Assessment   Patient: Juan [name] is an 89 y.o., male MRN: [id number] DOB: 1926-07-16 Height: 5\' 10"  (177.8 cm) Weight: 98 kg   Insurance Information HMO:     PPO: yes     PCP:      IPA:      80/20:      OTHER:  PRIMARY: Blue Medicare      Policy#: [id number]       Subscriber: pt CM Name: Larene Beach      Phone#: [phone]     Fax#: [phone] Pre-Cert#: [id number] approved by Larene Beach with [facility] Medicare with updates due 02/08/23 to fax above     Employer:  Benefits:  Phone #: [phone]     Name:  Eff. Date: 11/07/22     Deduct: $0      Out of Pocket Max: $3150 ($15 met)      Life Max: n/a CIR: $335/day for days 1-5      SNF: 20 full days Outpatient:      Co-Pay: $10/visit Home Health: 100%      Co-Pay:  DME: 80%     Co-Pay: 20% Providers:  SECONDARY: Medicaid Family Planning       Policy#: [id number]      Phone#:    Financial Counselor:       Phone#:    The "Data Collection Information Summary" for patients in Inpatient Rehabilitation Facilities with attached "Privacy Act [place] Records" was provided and verbally reviewed with: Patient and Family   Emergency Contact Information Contact Information       Name Relation Home Work Mobile    [place] Daughter     [phone]           Current Medical History  Patient Admitting Diagnosis: CVA   History of Present Illness: Pt is a 89 y/o male with PMH of hyperlipidemia, prostate cancer, and PAF (not on anticoagulation), who admitted to [facility] on 01/27/23 with c/o garbled speech, confusion, and agitation.  In ED pt afebrile, satting mid 90s on RA, slightly tacchycardic, SBP okay.  Labs notable for ETOH 12 and serum bicarbonate 16.  Head CT reveals an ill defined hypodensity in the left superior cerebellum and right frontal encephalomalacia.  CTA motion degraded.  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  Pt started on eliquis.  EEG showed no obvious  seizure activity.  A1C 5.9.  Therapy evaluations completed and pt was recommended for CIR.  Complete NIHSS TOTAL: 3   [facility]'s medical record from [facility] has been reviewed by the rehabilitation admission coordinator and physician.   Past Medical History      Past Medical History:  Diagnosis Date   Arrhythmia     Cancer ([place])     Hyperlipidemia     Prostate cancer ([place])     Shingles outbreak      LEFT CHEST WALL      Has the patient had major surgery during 100 days prior to admission? No   Family History   family history is not on file.   Current Medications   Current Facility-Administered Medications:    acetaminophen (TYLENOL) tablet 650 mg, 650 mg, Oral, Q4H PRN, 650 mg at 01/28/23 2031 **OR** acetaminophen (TYLENOL) 160 MG/5ML solution 650 mg, 650 mg, Per Tube, Q4H PRN **OR** acetaminophen (TYLENOL) suppository 650 mg, 650 mg, Rectal, Q4H PRN, Opyd, Ilene Qua, MD   apixaban (ELIQUIS) tablet 5 mg, 5 mg, Oral, BID, Pierce, Dwayne A, RPH, 5  mg at 02/01/23 1110   fluticasone (FLONASE) 50 MCG/ACT nasal spray 2 spray, 2 spray, Each Nare, Daily, Pokhrel, Laxman, MD, 2 spray at 01/31/23 0932   haloperidol lactate (HALDOL) injection 2 mg, 2 mg, Intravenous, Q6H PRN, Pokhrel, Laxman, MD, 2 mg at 01/31/23 2120   QUEtiapine (SEROQUEL) tablet 25 mg, 25 mg, Oral, QHS, Pokhrel, Laxman, MD, 25 mg at 01/31/23 2121   rosuvastatin (CRESTOR) tablet 20 mg, 20 mg, Oral, Daily, Beulah Gandy A, NP, 20 mg at 02/01/23 1110   senna-docusate (Senokot-S) tablet 1 tablet, 1 tablet, Oral, QHS PRN, Opyd, Ilene Qua, MD, 1 tablet at 01/28/23 2031   sodium chloride (OCEAN) 0.65 % nasal spray 1 spray, 1 spray, Each Nare, PRN, Howerter, Justin B, DO, 1 spray at 01/29/23 2252   Patients Current Diet:  Diet Order                  Diet - low sodium heart healthy             Diet regular Fluid consistency: Thin  Diet effective now                         Precautions /  Restrictions Precautions Precautions: Fall Restrictions Weight Bearing Restrictions: No    Has the patient had 2 or more falls or a fall with injury in the past year? No   Prior Activity Level Limited Community (1-2x/wk): independent, driving, no DME   Prior Functional Level Self Care: Did the patient need help bathing, dressing, using the toilet or eating? Independent   Indoor Mobility: Did the patient need assistance with walking from room to room (with or without device)? Independent   Stairs: Did the patient need assistance with internal or external stairs (with or without device)? Independent   Functional Cognition: Did the patient need help planning regular tasks such as shopping or remembering to take medications? Independent   Patient Information Are you of Hispanic, Latino/a,or Spanish origin?: A. No, not of Hispanic, Latino/a, or Spanish origin, X. Patient unable to respond (proxy) What is your race?: A. White, X. Patient unable to respond Do you need or want an interpreter to communicate with a doctor or health care staff?: 9. Unable to respond   Patient's Response To:  Health Literacy and Transportation Is the patient able to respond to health literacy and transportation needs?: No Health Literacy - How often do you need to have someone help you when you read instructions, pamphlets, or other written material from your doctor or pharmacy?: Patient unable to respond In the past 12 months, has lack of transportation kept you from medical appointments or from getting medications?: No In the past 12 months, has lack of transportation kept you from meetings, work, or from getting things needed for daily living?: No   Development worker, international aid / [place] Devices/Equipment: None Home Equipment: Other (comment), None   Prior Device Use: Indicate devices/aids used by the patient prior to current illness, exacerbation or injury? None of the above   Current  Functional Level Cognition   Arousal/Alertness: Awake/alert Overall Cognitive Status: Difficult to assess Difficult to assess due to: Impaired communication Orientation Level: Oriented to person, Oriented to place, Disoriented to time, Disoriented to situation Following Commands:  (delayed response speed, multimodal cues) General Comments: Difficulty following verbal commands but doing well with gestures. Comments: cognition difficult to assess secondary to patient's severe aphasia.    Extremity Assessment (includes Sensation/Coordination)  Upper Extremity Assessment: Defer to OT evaluation RUE Deficits / Details: used functionally for oral hygiene and LB ADLs - difficult to fully assess due to impiared cog/speech LUE Deficits / Details: used functionally for oral hygiene and LB ADLs - difficult to fully assess due to impiared cog/speech  Lower Extremity Assessment: Overall WFL for tasks assessed (unable to perform formal MMT due to pt's cognition but lifting BLE against gravity, no overt difference noted)     ADLs   Overall ADL's : Needs assistance/impaired Eating/Feeding: Set up, Sitting Grooming: Oral care, Standing, Supervision/safety, Wash/dry hands Grooming Details (indicate cue type and reason): thorough, sequenced independently Upper Body Bathing: Minimal assistance, Sitting Lower Body Bathing: Minimal assistance, Sit to/from stand Upper Body Dressing : Sitting, Set up, Min guard Lower Body Dressing: Set up, Sitting/lateral leans Toilet Transfer: Min guard, Ambulation Toileting- Clothing Manipulation and Hygiene: Supervision/safety Functional mobility during ADLs: Min guard General ADL Comments: visual and tactile cues needed to initiate all tasks     Mobility   Overal bed mobility: Needs Assistance Bed Mobility: Supine to Sit Supine to sit: HOB elevated, Min guard Sit to supine: Supervision General bed mobility comments: min guard for safety, increased gestures for pt to  inititate     Transfers   Overall transfer level: Needs assistance Equipment used: None Transfers: Sit to/from Stand Sit to Stand: Supervision General transfer comment: supervision for safety     Ambulation / Gait / Stairs / Wheelchair Mobility   Ambulation/Gait Ambulation/Gait assistance: Herbalist (Feet): 200 Feet Assistive device: None Gait Pattern/deviations: Step-through pattern, Wide base of support, Drifts right/left, Decreased stride length, Trunk flexed General Gait Details: wide BOS and increased lateral sway, minA provided for balance, no overt LOB but imbalance noted with pt reaching for support of furniture in room and hall intermittently. pt with increased c/o knee pain Gait velocity: decreased     Posture / Balance Balance Overall balance assessment: Needs assistance Sitting-balance support: No upper extremity supported, Feet supported Sitting balance-Leahy Scale: Good Standing balance support: No upper extremity supported, During functional activity Standing balance-Leahy Scale: Fair Standing balance comment: min guard no LOB, reaches for furniture/rail when available     Special needs/care consideration Behavioral consideration some combativeness with EMS and in ED, PRN haldol on acute.    Previous Home Environment (from acute therapy documentation) Living Arrangements: Alone  Lives With: Alone Available Help at Discharge: Family, Available PRN/intermittently Type of Home: House Home Layout: One level Home Access: Ramped entrance Bathroom Shower/Tub: Multimedia programmer: [place]: No Additional Comments: Pt has children who can assist intermittently, daughter at bedside reporting potential for pt to move in with her if needed   Discharge Living Setting Plans for Discharge Living Setting: Patient's home (children will stay with him for supervision) Type of Home at Discharge: House Discharge Home Layout: One  level Discharge Home Access: [place] entrance Discharge Bathroom Shower/Tub: Walk-in shower Discharge Bathroom Toilet: Standard Discharge Bathroom Accessibility: Yes How Accessible: Accessible via wheelchair Does the patient have any problems obtaining your medications?: No   Social/Family/Support Systems Anticipated Caregiver: Julien Girt (daughter) Anticipated Caregiver's Contact Information: [phone] Ability/Limitations of Caregiver: n/a Caregiver Availability: 24/7 Discharge Plan Discussed with Primary Caregiver: Yes Is Caregiver In Agreement with Plan?: Yes Does Caregiver/Family have Issues with Lodging/Transportation while Pt is in Rehab?: No   Goals Patient/Family Goal for Rehab: PT/OT/SLP supervision to mod I Expected length of stay: 7-10 days Additional Information: discharge plan: return to patient's home,  have multiple family members prepared to provide 24/7 supervision Pt/Family Agrees to Admission and willing to participate: Yes Program Orientation Provided & Reviewed with Pt/Caregiver Including Roles  & Responsibilities: Yes  Barriers to Discharge: Insurance for SNF coverage   Decrease burden of Care through IP rehab admission: n/a   Possible need for SNF placement upon discharge: Not anticipated.  Plan for discharge to patient's home with family providing 24/7 supervision.    Patient Condition: I have reviewed medical records from [facility], spoken with CSW, and patient and daughter. I met with patient at the bedside and discussed via phone for inpatient rehabilitation assessment.  Patient will benefit from ongoing PT, OT, and SLP, can actively participate in 3 hours of therapy a day 5 days of the week, and can make measurable gains during the admission.  Patient will also benefit from the coordinated team approach during an Inpatient Acute Rehabilitation admission.  The patient will receive intensive therapy as well as Rehabilitation physician, nursing, social  worker, and care management interventions.  Due to bladder management, bowel management, safety, skin/wound care, disease management, medication administration, pain management, and patient education the patient requires 24 hour a day rehabilitation nursing.  The patient is currently min assist with mobility and basic ADLs.  Discharge setting and therapy post discharge at home with home health is anticipated.  Patient has agreed to participate in the Acute Inpatient Rehabilitation Program and will admit today.   Preadmission Screen Completed By:  Michel Santee, PT, DPT 02/01/2023 1:08 PM ______________________________________________________________________   Discussed status with Dr. Curlene Dolphin on 02/01/23  at 1:08 PM  and received approval for admission today.   Admission Coordinator:  Michel Santee, PT, DPT time 1:08 PM Sudie Grumbling 02/01/23     Assessment/Plan: Diagnosis:CVA Does the need for close, 24 hr/day Medical supervision in concert with the patient's rehab needs make it unreasonable for this patient to be served in a less intensive setting? Yes Co-Morbidities requiring supervision/potential complications: delierium, Afib, CKD IIIa, obesity, sinuscongestion  Due to bladder management, bowel management, safety, skin/wound care, disease management, medication administration, pain management, and patient education, does the patient require 24 hr/day rehab nursing? Yes Does the patient require coordinated care of a physician, rehab nurse, PT, OT, and SLP to address physical and functional deficits in the context of the above medical diagnosis(es)? Yes Addressing deficits in the following areas: balance, endurance, locomotion, strength, transferring, bowel/bladder control, bathing, dressing, feeding, grooming, toileting, cognition, speech, language, and psychosocial support Can the patient actively participate in an intensive therapy program of at least 3 hrs of therapy 5 days a week? Yes The  potential for patient to make measurable gains while on inpatient rehab is excellent Anticipated functional outcomes upon discharge from inpatient rehab: modified independent and supervision PT, modified independent and supervision OT, modified independent and supervision SLP Estimated rehab length of stay to reach the above functional goals is: 7-10 Anticipated discharge destination: Home 10. Overall Rehab/Functional Prognosis: excellent     MD Signature: Reola Calkins

## 2023-02-02 DIAGNOSIS — I6602 Occlusion and stenosis of left middle cerebral artery: Secondary | ICD-10-CM

## 2023-02-02 LAB — COMPREHENSIVE METABOLIC PANEL
ALT: 28 U/L (ref 0–44)
AST: 31 U/L (ref 15–41)
Albumin: 3.1 g/dL — ABNORMAL LOW (ref 3.5–5.0)
Alkaline Phosphatase: 67 U/L (ref 38–126)
Anion gap: 9 (ref 5–15)
BUN: 19 mg/dL (ref 8–23)
CO2: 25 mmol/L (ref 22–32)
Calcium: 8.8 mg/dL — ABNORMAL LOW (ref 8.9–10.3)
Chloride: 102 mmol/L (ref 98–111)
Creatinine, Ser: 1.29 mg/dL — ABNORMAL HIGH (ref 0.61–1.24)
GFR, Estimated: 51 mL/min — ABNORMAL LOW (ref >60)
Glucose, Bld: 94 mg/dL (ref 70–99)
Potassium: 4.1 mmol/L (ref 3.5–5.1)
Sodium: 136 mmol/L (ref 135–145)
Total Bilirubin: 1 mg/dL (ref 0.3–1.2)
Total Protein: 5.9 g/dL — ABNORMAL LOW (ref 6.5–8.1)

## 2023-02-02 LAB — CBC WITH DIFFERENTIAL/PLATELET
Abs Immature Granulocytes: 0.07 10*3/uL (ref 0.00–0.07)
Basophils Absolute: 0.1 10*3/uL (ref 0.0–0.1)
Basophils Relative: 1 %
Eosinophils Absolute: 0.5 10*3/uL (ref 0.0–0.5)
Eosinophils Relative: 6 %
HCT: 39.6 % (ref 39.0–52.0)
Hemoglobin: 13.6 g/dL (ref 13.0–17.0)
Immature Granulocytes: 1 %
Lymphocytes Relative: 16 %
Lymphs Abs: 1.4 10*3/uL (ref 0.7–4.0)
MCH: 35.8 pg — ABNORMAL HIGH (ref 26.0–34.0)
MCHC: 34.3 g/dL (ref 30.0–36.0)
MCV: 104.2 fL — ABNORMAL HIGH (ref 80.0–100.0)
Monocytes Absolute: 1.7 10*3/uL — ABNORMAL HIGH (ref 0.1–1.0)
Monocytes Relative: 18 %
Neutro Abs: 5.4 10*3/uL (ref 1.7–7.7)
Neutrophils Relative %: 58 %
Platelets: 219 10*3/uL (ref 150–400)
RBC: 3.8 MIL/uL — ABNORMAL LOW (ref 4.22–5.81)
RDW: 13.2 % (ref 11.5–15.5)
WBC: 9.2 10*3/uL (ref 4.0–10.5)
nRBC: 0 % (ref 0.0–0.2)

## 2023-02-02 NOTE — Plan of Care (Signed)
  Problem: RH Comprehension Communication Goal: LTG Patient will comprehend basic/complex auditory (SLP) Description: LTG: Patient will comprehend basic/complex auditory information with cues (SLP). 02/02/2023 1537 by Buzzy Han, CCC-SLP Flowsheets (Taken 02/02/2023 1537) LTG: Patient will comprehend auditory information with cueing (SLP): Maximal Assistance - Patient 25 - 49% 02/02/2023 1534 by Buzzy Han, CCC-SLP Flowsheets (Taken 02/02/2023 1534) LTG: Patient will comprehend: Basic auditory information LTG: Patient will comprehend auditory information with cueing (SLP): Moderate Assistance - Patient 50 - 74%   Problem: RH Expression Communication Goal: LTG Patient will verbally express basic/complex needs(SLP) Description: LTG:  Patient will verbally express basic/complex needs, wants or ideas with cues  (SLP) 02/02/2023 1537 by Buzzy Han, CCC-SLP Flowsheets (Taken 02/02/2023 1537) LTG: Patient will verbally express basic/complex needs, wants or ideas (SLP): Maximal Assistance - Patient 25 - 49% 02/02/2023 1534 by Buzzy Han, CCC-SLP Flowsheets (Taken 02/02/2023 1534) LTG: Patient will verbally express basic/complex needs, wants or ideas (SLP): Moderate Assistance - Patient 50 - 74% Goal: LTG Patient will increase word finding of common (SLP) Description: LTG:  Patient will increase word finding of common objects/daily info/abstract thoughts with cues using compensatory strategies (SLP). Flowsheets (Taken 02/02/2023 1534) LTG: Patient will increase word finding of common (SLP): Minimal Assistance - Patient > 75%

## 2023-02-02 NOTE — Progress Notes (Signed)
Inpatient Rehabilitation  Patient information reviewed and entered into eRehab system by Egan Sahlin M. Ruqaya Strauss, M.A., CCC/SLP, PPS Coordinator.  Information including medical coding, functional ability and quality indicators will be reviewed and updated through discharge.    

## 2023-02-02 NOTE — Evaluation (Signed)
Physical Therapy Assessment and Plan  Patient Details  Name: Juan [name] MRN: [id number] Date of Birth: [birth year]  PT Diagnosis: Cognitive deficits, Impaired cognition, and Muscle weakness Rehab Potential: Excellent ELOS: 1 day   Today's Date: 02/02/2023 PT Individual Time: [id number] PT Individual Time Calculation (min): 62 min    Hospital Problem: Principal Problem:   CVA (cerebral vascular accident) [facility])   Past Medical History:  Past Medical History:  Diagnosis Date   Arrhythmia    Diverticulitis 2004   Hyperlipidemia    Prostate cancer ([place])    Shingles outbreak    LEFT CHEST WALL   Past Surgical History:  Past Surgical History:  Procedure Laterality Date   APPENDECTOMY      Assessment & Plan Clinical Impression: Patient is a 89 y.o. year old male with history of prostate cancer, CKD  IIIa, PAF who was admitted on 01/27/23 with garbled speech, confusion and agitation.  He was on aspirin prior to admission.  Last seen normal on 03/18, he was combative, had metabolic acidosis and needed Versed. He was treated with IVF and CT head done showing diminished opacification anterior L-MCA branches.  MRI brain done showing moderate acute L-MCA territory involving left temporal and temporo-occipital region with suspected petechial blood products in left temporal lobe and acute left cerebellar infarct with few punctate areas in right occipital and right cerebellum felt to be central thromboembolic in origin. 2D echo showed EF 55-60% with aortic dilatation and mild to moderate aortic calcification. He was loaded with Keppra, started on ASA and EEG done which showed slowing in left temporal region was negative for seizures.  He has had issues with agitation requiring Haldol and low dose seroquel added to help with sleep. His verbal output improving with increased ability to follow gestures. He was transitioned to Eliquis.     Therapy ongoing and patient with mixed expressive  receptive aphasia with ability to follow commands with multimodal cues, has balance deficits and delay in reponses. CIR recommended due to functional decline. Patient transferred to CIR on 02/01/2023 .   Patient currently requires supervision with higher level mobility secondary to decreased balance strategies.  Prior to hospitalization, patient was independent  with mobility and lived with Alone in a House home.  Home access is  Ramped entrance.  Patient will benefit from skilled PT intervention to maximize safe functional mobility, minimize fall risk, and decrease caregiver burden for planned discharge home with 24 hour supervision.  Anticipate patient will benefit from follow up OP at discharge.  PT - End of Session Activity Tolerance: Tolerates 30+ min activity with multiple rests Endurance Deficit: No PT Assessment Rehab Potential (ACUTE/IP ONLY): Excellent PT Patient demonstrates impairments in the following area(s): Balance;Pain;Motor PT Transfers Functional Problem(s): Car;Floor PT Locomotion Functional Problem(s): Ambulation;Stairs PT Plan PT Intensity: Minimum of 1-2 x/day ,45 to 90 minutes PT Frequency: 5 out of 7 days PT Duration Estimated Length of Stay: 1 day PT Treatment/Interventions: Ambulation/gait training;Community reintegration;Neuromuscular re-education;Stair training;UE/LE Strength taining/ROM;Discharge planning;Balance/vestibular training;Pain management;Therapeutic Activities;UE/LE Coordination activities;Cognitive remediation/compensation;Disease management/prevention;Functional mobility training;Patient/family education PT Transfers Anticipated Outcome(s): independent PT Locomotion Anticipated Outcome(s): independent PT Recommendation Follow Up Recommendations: Outpatient PT;24 hour supervision/assistance Patient destination: Home Equipment Recommended: None recommended by PT   PT Evaluation Precautions/Restrictions Precautions Precautions: Other  (comment) Precaution Comments: global aphasia Restrictions Weight Bearing Restrictions: No Pain Pain Assessment Pain Scale: Faces Pain Score: 0-No pain Faces Pain Scale: No hurt Pain Interference Pain Interference Pain Effect on Sleep: 8. Unable to answer (due to  aphasia) Pain Interference with Therapy Activities: 8. Unable to answer Pain Interference with Day-to-Day Activities: 8. Unable to answer Home Living/Prior Functioning Home Living Living Arrangements: Alone Available Help at Discharge: Family;Available 24 hours/day (per SW family able to provide 24hr support) Type of Home: House Home Access: Ramped entrance Home Layout: One level Bathroom Shower/Tub: Multimedia programmer: Standard  Lives With: Alone Prior Function Level of Independence: Independent with basic ADLs;Independent with homemaking with ambulation;Independent with gait;Independent with transfers  Able to Take Stairs?: Yes Driving: Yes Vocation: Retired Radiographer, therapeutic - History Ability to See in Adequate Light: 0 Adequate Vision - Assessment Eye Alignment: Within Functional Limits Ocular Range of Motion: Within Functional Limits Alignment/Gaze Preference: Within Defined Limits Tracking/Visual Pursuits: Able to track stimulus in all quads without difficulty Saccades: Within functional limits Perception Perception: Within Functional Limits Praxis Praxis: Intact (pt with difficulty understanding requested activity/movement due to aphasia but after visual demonstration can appropriately motor plan and execute)  Cognition Overall Cognitive Status: Difficult to assess Arousal/Alertness: Awake/alert Orientation Level: Oriented to person;Oriented to place;Oriented to time Awareness: Impaired Awareness Impairment: Emergent impairment Safety/Judgment: Impaired Comments: cognition difficult to assess secondary to patient's global aphasia. Sensation  Sensation Light Touch: Appears Intact  (responded to stimulus bilaterally but unable to formally assess due to aphasia) Hot/Cold: Not tested Proprioception: Appears Intact Stereognosis: Not tested Coordination Gross Motor Movements are Fluid and Coordinated: Yes Fine Motor Movements are Fluid and Coordinated: Yes Finger Nose Finger Test: [facility] 9 Hole Peg Test: R side 28 seconds, L side 35 seconds Motor  Motor Motor: Within Functional Limits   Trunk/Postural Assessment  Cervical Assessment Cervical Assessment: Within Functional Limits Thoracic Assessment Thoracic Assessment: Within Functional Limits Lumbar Assessment Lumbar Assessment: Within Functional Limits Postural Control Postural Control: Within Functional Limits  Balance Balance Balance Assessed: Yes Standardized Balance Assessment Standardized Balance Assessment: Berg Balance Test Berg Balance Test Sit to Stand: Able to stand without using hands and stabilize independently Standing Unsupported: Able to stand safely 2 minutes Sitting with Back Unsupported but Feet Supported on Floor or Stool: Able to sit safely and securely 2 minutes Stand to Sit: Sits safely with minimal use of hands Transfers: Able to transfer safely, minor use of hands Standing Unsupported with Eyes Closed: Able to stand 10 seconds safely Standing Ubsupported with Feet Together: Able to place feet together independently and stand for 1 minute with supervision From Standing, Reach Forward with Outstretched Arm: Can reach forward >12 cm safely (5") From Standing Position, Pick up Object from Floor: Able to pick up shoe, needs supervision From Standing Position, Turn to Look Behind Over each Shoulder: Looks behind from both sides and weight shifts well Turn 360 Degrees: Able to turn 360 degrees safely one side only in 4 seconds or less Standing Unsupported, Alternately Place Feet on Step/Stool: Able to complete >2 steps/needs minimal assist Standing Unsupported, One Foot in Front: Able to plae  foot ahead of the other independently and hold 30 seconds Standing on One Leg: Unable to try or needs assist to prevent fall Total Score: 44 Static Sitting Balance Static Sitting - Balance Support: Feet supported Static Sitting - Level of Assistance: 6: Modified independent (Device/Increase time) Dynamic Sitting Balance Dynamic Sitting - Balance Support: Feet supported Dynamic Sitting - Level of Assistance: 6: Modified independent (Device/Increase time) Static Standing Balance Static Standing - Balance Support: During functional activity Static Standing - Level of Assistance: 7: Independent Dynamic Standing Balance Dynamic Standing - Balance Support: During functional activity Dynamic  Standing - Level of Assistance: 6: Modified independent (Device/Increase time);5: Stand by assistance Extremity Assessment  RLE Assessment RLE Assessment: Within Functional Limits Active Range of Motion (AROM) Comments: WFL General Strength Comments: assessed functionally as pt with difficulty understanding visual demonstration for formal testing in sitting LLE Assessment LLE Assessment: Within Functional Limits Active Range of Motion (AROM) Comments: WFL General Strength Comments: assessed functionally as pt with difficulty understanding visual demonstration for formal testing in sitting; however, did note slower movement in ankle DF/PF compared to [place] Tool Bed Mobility Roll left and right activity   Roll left and right assist level: Independent    Sit to lying activity   Sit to lying assist level: Independent    Lying to sitting on side of bed activity   Lying to sitting on side of bed assist level: the ability to move from lying on the back to sitting on the side of the bed with no back support.: Independent     Care Tool Transfers Sit to stand transfer   Sit to stand assist level: Independent    Chair/bed transfer   Chair/bed transfer assist level: Independent     Toilet  transfer   Assist Level: Ship broker transfer assist level: Supervision/Verbal cueing      Care Tool Locomotion Ambulation   Assist level: Supervision/Verbal cueing Assistive device: No Device Max distance: 258ft  Walk 10 feet activity   Assist level: Independent Assistive device: No Device   Walk 50 feet with 2 turns activity   Assist level: Supervision/Verbal cueing Assistive device: No Device  Walk 150 feet activity   Assist level: Supervision/Verbal cueing Assistive device: No Device  Walk 10 feet on uneven surfaces activity   Assist level: Supervision/Verbal cueing Assistive device: Other (comment) (railing)  Stairs   Assist level: Supervision/Verbal cueing Stairs assistive device: 2 hand rails Max number of stairs: 12  Walk up/down 1 step activity   Walk up/down 1 step (curb) assist level: Supervision/Verbal cueing Walk up/down 1 step or curb assistive device: 2 hand rails  Walk up/down 4 steps activity   Walk up/down 4 steps assist level: Supervision/Verbal cueing Walk up/down 4 steps assistive device: 2 hand rails  Walk up/down 12 steps activity   Walk up/down 12 steps assist level: Supervision/Verbal cueing Walk up/down 12 steps assistive device: 2 hand rails  Pick up small objects from floor   Pick up small object from the floor assist level: Supervision/Verbal cueing    Wheelchair Is the patient using a wheelchair?: No          Wheel 50 feet with 2 turns activity      Wheel 150 feet activity        Refer to Care Plan for Long Term Goals  SHORT TERM GOAL WEEK 1 PT Short Term Goal 1 (Week 1): = to LTGs based on ELOS  Recommendations for other services: None   Skilled Therapeutic Intervention Pt received sitting in recliner and agreeable to therapy session. Evaluation completed (see details above) with patient education regarding purpose of PT evaluation, PT POC and goals, therapy schedule, weekly team meetings, and other CIR  information including safety plan and fall risk safety. Pt performed the below functional mobility tasks with the specified levels of skilled cuing and assistance. At end of session, pt left seated in recliner and was made mod-I in room with nursing staff aware.  Mobility Bed Mobility Bed Mobility: Supine to  Sit;Sit to Supine Supine to Sit: Independent with assistive device Sit to Supine: Independent with assistive device Transfers Transfers: Sit to Stand;Stand to Sit;Stand Pivot Transfers Sit to Stand: Independent Stand to Sit: Independent Stand Pivot Transfers: Independent Transfer (Assistive device): None Locomotion  Gait Ambulation: Yes Gait Assistance: Supervision/Verbal cueing;Independent (independent household level, supervision longer distances/community) Gait Distance (Feet): 250 Feet Assistive device: None Gait Assistance Details: Verbal cues for precautions/safety Gait Gait: Yes Gait Pattern: Within Functional Limits (hx of L knee pain with pt wearing personal brace for support and slight antalgic gait pattern due to this) Gait velocity: WFL for household and limited community ambulation Stairs / Additional Locomotion Stairs: Yes Stairs Assistance: Supervision/Verbal cueing Stair Management Technique: Two rails;Step to pattern;Forwards Number of Stairs: 12 Height of Stairs: 6 Ramp: Supervision/Verbal cueing (using railing) Curb: Supervision/Verbal cueing (using railing) Wheelchair Mobility Wheelchair Mobility: No   Discharge Criteria: Patient will be discharged from PT if patient refuses treatment 3 consecutive times without medical reason, if treatment goals not met, if there is a change in medical status, if patient makes no progress towards goals or if patient is discharged from hospital.  The above assessment, treatment plan, treatment alternatives and goals were discussed and mutually agreed upon: by patient  Tawana Scale , PT, DPT, NCS, CSRS 02/02/2023, 2:10  PM

## 2023-02-02 NOTE — Progress Notes (Signed)
PROGRESS NOTE   Subjective/Complaints:  Pt in chair just finished amb with therapy.  Daughter at bedside , states that pt does not require hearing aides at home  ROS- limited by aphasia  Objective:   No results found. Recent Labs    02/02/23 0530  WBC 9.2  HGB 13.6  HCT 39.6  PLT 219   Recent Labs    02/02/23 0530  NA 136  K 4.1  CL 102  CO2 25  GLUCOSE 94  BUN 19  CREATININE 1.29*  CALCIUM 8.8*    Intake/Output Summary (Last 24 hours) at 02/02/2023 0830 Last data filed at 02/02/2023 [id number] Gross per 24 hour  Intake 903 ml  Output --  Net 903 ml        Physical Exam: Vital Signs Blood pressure (!) 157/90, pulse 84, temperature 97.7 F (36.5 C), resp. rate 19, height 5\' 10"  (1.778 m), weight 94.5 kg, SpO2 92 %.   General: No acute distress Mood and affect are appropriate Heart: Regular rate and rhythm no rubs murmurs or extra sounds Lungs: Clear to auscultation, breathing unlabored, no rales or wheezes Abdomen: Positive bowel sounds, soft nontender to palpation, nondistended Extremities: No clubbing, cyanosis, or edema Skin: No evidence of breakdown, no evidence of rash Neurologic: Cranial nerves II through XII intact, motor strength is 4/5 in bilateral deltoid, bicep, tricep, grip, hip flexor, knee extensors, ankle dorsiflexor and plantar flexor Sensory exam limited by aphasia  Cerebellar exam normal finger to nose to finger as well as heel to shin in bilateral upper and lower extremities Musculoskeletal: Full range of motion in all 4 extremities. No joint swelling   Assessment/Plan: 1. Functional deficits which require 3+ hours per day of interdisciplinary therapy in a comprehensive inpatient rehab setting. Physiatrist is providing close team supervision and 24 hour management of active medical problems listed below. Physiatrist and rehab team continue to assess barriers to discharge/monitor patient  progress toward functional and medical goals  Care Tool:  Bathing              Bathing assist       Upper Body Dressing/Undressing Upper body dressing        Upper body assist      Lower Body Dressing/Undressing Lower body dressing            Lower body assist       Toileting Toileting    Toileting assist       Transfers Chair/bed transfer  Transfers assist           Locomotion Ambulation   Ambulation assist              Walk 10 feet activity   Assist           Walk 50 feet activity   Assist           Walk 150 feet activity   Assist           Walk 10 feet on uneven surface  activity   Assist           Wheelchair     Assist  Wheelchair 50 feet with 2 turns activity    Assist            Wheelchair 150 feet activity     Assist          Blood pressure (!) 157/90, pulse 84, temperature 97.7 F (36.5 C), resp. rate 19, height 5\' 10"  (1.778 m), weight 94.5 kg, SpO2 92 %.  Medical Problem List and Plan: 1. Functional deficits secondary to Acute left MCA dn cerebellar ischemic infarct, likely cardioembolic in the setting of atrial fibrillation not on anticoagulation Receptive aphasia              -patient may shower             -ELOS/Goals: 7-10 days, PT/OT/SLP sup to mod I             -Admit to CIR, PT OT SLP evals  2.  Antithrombotics: -DVT/anticoagulation:  Pharmaceutical: Eliquis             -antiplatelet therapy: N/A 3. Pain Management:  Tylenol prn.  4. Mood/Behavior/Sleep: LCSW to follow for evaluation and support when appropraite             --Continue Seroquel--may need to increase to 50 mg as off haldol now.              -antipsychotic agents: N/A 5. Neuropsych/cognition: This patient is not capable of making decisions on his own behalf. 6. Skin/Wound Care: Routine pressure relief measures.  7. Fluids/Electrolytes/Nutrition: Monitor I/O. Check CMET in am 8.  PAF: Monitor HR TID and for symptoms with increase in activity. --continue Eliquis BID. May need BB if tachycardia and elevated BP persist , check ortho vitals  9. Delirium. Has been getting Haldol 2 mg IV at bedtime since admission for agitation             --sleep wake chart.              --family to spend nights to transition patient to CIR.  10. HTN: Long-term goal normotensive.  Monitor with activity Vitals:   02/01/23 2024 02/02/23 0607  BP: (!) 143/78 (!) 157/90  Pulse: (!) 105 84  Resp: 18 19  Temp: 98.8 F (37.1 C) 97.7 F (36.5 C)  SpO2: 98% 92%    11.  Hyperlipidemia: Continue Crestor 20 mg 12.  CKD IIIa.  Recheck labs tomorrow    LOS: 1 days A FACE TO FACE EVALUATION WAS PERFORMED  Charlett Blake 02/02/2023, 8:30 AM

## 2023-02-02 NOTE — Evaluation (Signed)
Occupational Therapy Assessment and Plan  Patient Details  Name: Juan [name] MRN: [id number] Date of Birth: [birth year]  OT Diagnosis: cognitive deficits and communication deficits Rehab Potential: Rehab Potential (ACUTE ONLY): Good ELOS: 1-2 days   Today's Date: 02/02/2023 OT Individual Time: [id number] OT Individual Time Calculation (min): 75 min     Hospital Problem: Principal Problem:   CVA (cerebral vascular accident) [facility])   Past Medical History:  Past Medical History:  Diagnosis Date   Arrhythmia    Diverticulitis 2004   Hyperlipidemia    Prostate cancer ([place])    Shingles outbreak    LEFT CHEST WALL   Past Surgical History:  Past Surgical History:  Procedure Laterality Date   APPENDECTOMY      Assessment & Plan Clinical Impression:    Juan [name] is a 89 year old male with history of prostate cancer, CKD  IIIa, PAF who was admitted on 01/27/23 with garbled speech, confusion and agitation.  He was on aspirin prior to admission.  Last seen normal on 03/18, he was combative, had metabolic acidosis and needed Versed. He was treated with IVF and CT head done showing diminished opacification anterior L-MCA branches.  MRI brain done showing moderate acute L-MCA territory involving left temporal and temporo-occipital region with suspected petechial blood products in left temporal lobe and acute left cerebellar infarct with few punctate areas in right occipital and right cerebellum felt to be central thromboembolic in origin. 2D echo showed EF 55-60% with aortic dilatation and mild to moderate aortic calcification. He was loaded with Keppra, started on ASA and EEG done which showed slowing in left temporal region was negative for seizures.  He has had issues with agitation requiring Haldol and low dose seroquel added to help with sleep. His verbal output improving with increased ability to follow gestures. He was transitioned to Eliquis.     Therapy ongoing and patient  with mixed expressive receptive aphasia with ability to follow commands with multimodal cues, has balance deficits and delay in reponses. CIR recommended due to functional decline..  Patient transferred to CIR on 02/01/2023 .    Patient currently requires supervision with basic self-care skills secondary to decreased awareness and difficulty communication and comprehending communication .  Prior to hospitalization, patient was fully independent and lived alone.    Patient will benefit from skilled intervention to increase independence with basic self-care skills prior to discharge home with care partner.  Anticipate patient will require 24 hour supervision and no further OT follow recommended.  OT - End of Session Endurance Deficit: No OT Assessment Rehab Potential (ACUTE ONLY): Good OT Patient demonstrates impairments in the following area(s): Cognition;Other (Comment) (aphasia) OT Basic ADL's Functional Problem(s): Dressing;Toileting;Bathing OT Transfers Functional Problem(s): Toilet;Tub/Shower OT Additional Impairment(s): None OT Plan OT Intensity: Minimum of 1-2 x/day, 45 to 90 minutes OT Frequency: 5 out of 7 days OT Duration/Estimated Length of Stay: 1-2 days OT Treatment/Interventions: Self Care/advanced ADL retraining;Therapeutic Activities;Therapeutic Exercise;Functional mobility training;Patient/family education OT Self Feeding Anticipated Outcome(s): no goal, pt is independent OT Basic Self-Care Anticipated Outcome(s): Independent OT Toileting Anticipated Outcome(s): independent OT Bathroom Transfers Anticipated Outcome(s): independent OT Recommendation Patient destination: Home Follow Up Recommendations: None Equipment Recommended: None recommended by OT   OT Evaluation Precautions/Restrictions  Precautions Precautions: Other (comment) Precaution Comments: global aphasia Restrictions Weight Bearing Restrictions: No  Pain Pain Assessment Pain Score: 0-No pain Home  Living/Prior Functioning Home Living Living Arrangements: Alone Available Help at Discharge: Family, Available PRN/intermittently Type of Home: House  Home Access: Ramped entrance Home Layout: One level Bathroom Shower/Tub: Multimedia programmer: Standard  Lives With: Alone Prior Function Level of Independence: Independent with basic ADLs, Independent with homemaking with ambulation, Independent with gait, Independent with transfers  Able to Take Stairs?: Yes Driving: Yes Vocation: Retired Surveyor, mining Baseline Vision/History: 0 No visual deficits Ability to See in Adequate Light: 0 Adequate Patient Visual Report: No change from baseline Vision Assessment?: No apparent visual deficits;Yes Eye Alignment: Within Functional Limits Ocular Range of Motion: Within Functional Limits Alignment/Gaze Preference: Within Defined Limits Tracking/Visual Pursuits: Able to track stimulus in all quads without difficulty Saccades: Within functional limits Visual Fields: No apparent deficits Perception  Perception: Within Functional Limits Praxis Praxis: Intact Cognition Cognition Overall Cognitive Status: Difficult to assess Arousal/Alertness: Awake/alert Orientation Level: Person (unable to state even with cues) Awareness: Impaired (pt does not seem to be fully aware of his aphasia, ie he will speak as if he is being understood with his expressive challenges) Awareness Impairment: Intellectual impairment Comments: cognition difficult to assess secondary to patient's severe aphasia. Brief Interview for Mental Status (BIMS) Repetition of Three Words (First Attempt): 3 (had to modify test using written words) Temporal Orientation: Year: Correct Temporal Orientation: Month: Accurate within 5 days Temporal Orientation: Day: Correct Recall: "Sock": Nonsensical Recall: "Blue": Nonsensical Recall: "Bed": Nonsensical BIMS Summary Score: 9 Sensation Sensation Light Touch: Appears  Intact Hot/Cold: Appears Intact Proprioception: Appears Intact Stereognosis: Not tested Coordination Gross Motor Movements are Fluid and Coordinated: Yes Fine Motor Movements are Fluid and Coordinated: Yes Finger Nose Finger Test: [facility] 9 Hole Peg Test: R side 28 seconds, L side 35 seconds Motor  Motor Motor: Within Functional Limits  Trunk/Postural Assessment  Postural Control Postural Control: Within Functional Limits  Balance Static Standing Balance Static Standing - Level of Assistance: 7: Independent Dynamic Standing Balance Dynamic Standing - Level of Assistance: 5: Stand by assistance Extremity/Trunk Assessment RUE Assessment RUE Assessment: Within Functional Limits General Strength Comments: 5/5 LUE Assessment LUE Assessment: Within Functional Limits General Strength Comments: 5/5  Care Tool Care Tool Self Care Eating   Eating Assist Level: Independent    Oral Care    Oral Care Assist Level: Independent    Bathing   Body parts bathed by patient: Right arm;Left arm;Chest;Abdomen;Front perineal area;Buttocks;Right upper leg;Left upper leg;Face;Left lower leg;Right lower leg     Assist Level: Supervision/Verbal cueing    Upper Body Dressing(including orthotics)   What is the patient wearing?: Pull over shirt   Assist Level: Independent    Lower Body Dressing (excluding footwear)   What is the patient wearing?: Underwear/pull up;Pants Assist for lower body dressing: Supervision/Verbal cueing    Putting on/Taking off footwear   What is the patient wearing?: Shoes Assist for footwear: Supervision/Verbal cueing       Care Tool Toileting Toileting activity   Assist for toileting: Supervision/Verbal cueing     Care Tool Bed Mobility Roll left and right activity        Sit to lying activity        Lying to sitting on side of bed activity         Care Tool Transfers Sit to stand transfer   Sit to stand assist level: Supervision/Verbal cueing     Chair/bed transfer   Chair/bed transfer assist level: Supervision/Verbal cueing     Toilet transfer   Assist Level: Supervision/Verbal cueing     Care Tool Cognition  Expression of Ideas and Wants Expression of Ideas and Wants: 2.  Frequent difficulty - frequently exhibits difficulty with expressing needs and ideas  Understanding Verbal and Non-Verbal Content Understanding Verbal and Non-Verbal Content: 2. Sometimes understands - understands only basic conversations or simple, direct phrases. Frequently requires cues to understand   Memory/Recall Ability     Refer to Care Plan for Long Term Goals  SHORT TERM GOAL WEEK 1 OT Short Term Goal 1 (Week 1): STGs = LTGs  Recommendations for other services: None    Skilled Therapeutic Intervention ADL ADL Eating: Independent Grooming: Independent Where Assessed-Grooming: Standing at sink Upper Body Bathing: Supervision/safety Where Assessed-Upper Body Bathing: Shower Lower Body Bathing: Supervision/safety Where Assessed-Lower Body Bathing: Shower Upper Body Dressing: Independent Lower Body Dressing: Supervision/safety Toileting: Supervision/safety Where Assessed-Toileting: Glass blower/designer: Distant supervision Armed forces technical officer Method: Magazine features editor: Distant supervision Social research officer, government Method: Ambulating Mobility   Supervision with all mobility without an AD  Pt seen for initial evaluation.  Pt has receptive and expressive aphasia, so attempts to explain role of OT and discuss his plan of care were very limited.  Pt did follow visual instructions and could understand basic questions about his self care.  He stated he already showered and used the bathroom. He showed me how he donned his L knee brace.  No PMH in chart of knee issues.  Pt was agreeable to demonstrating how he ambulated around the room, pt walked to sink to brush teeth, then to bathroom to demonstrate how he gets on and off the toilet,  stepped in and out of shower and demonstrated "dry run" how he washes off entire body in standing.  No physical A or cues for safety needed.  Distant supervision only.    Pt then ambulated to the ADL apartment and practiced reaching into low cupboards to retrieve heavy pots and pants by squatting and reaching with no LOB or difficulty.  He asked, "do you want me to make you some eggs?"   He ambulated to apt and practiced getting up and down from low recliner.  Then walked to day room using good speed.  Pt may have a slightly wide BOS and slight sway when he ambulated but this is likely due to his avoidance of putting too much weight on L knee.  "I dont like walking super far..." pt pointed to his knee.    Pt engaged in visual and [facility] assessments,  able to copy design and do clock draw test.  When asked to write his address he could not recall his street name or the year of his birth.  Pt worked on a Water quality scientist following directions with min cues.  Ambulated back to gym to go up and down stairs in gym with no A or cues.  He has a ramped entrance to his home.    When asked what feels different since his stroke, pt stated "nothing".   Pt ambulated back to his room with all needs met.   Discharge Criteria: Patient will be discharged from OT if patient refuses treatment 3 consecutive times without medical reason, if treatment goals not met, if there is a change in medical status, if patient makes no progress towards goals or if patient is discharged from hospital.  The above assessment, treatment plan, treatment alternatives and goals were discussed and mutually agreed upon: by patient  [facility] 02/02/2023, 12:55 PM

## 2023-02-02 NOTE — Progress Notes (Signed)
Inpatient Rehabilitation Care Coordinator Assessment and Plan Patient Details  Name: Juan [name] MRN: [id number] Date of Birth: 07-09-1926  Today's Date: 02/02/2023  Hospital Problems: Principal Problem:   CVA (cerebral vascular accident) [facility])  Past Medical History:  Past Medical History:  Diagnosis Date   Arrhythmia    Diverticulitis 2004   Hyperlipidemia    Prostate cancer ([place])    Shingles outbreak    LEFT CHEST WALL   Past Surgical History:  Past Surgical History:  Procedure Laterality Date   APPENDECTOMY     Social History:  reports that he has never smoked. He does not have any smokeless tobacco history on file. He reports that he does not use drugs. No history on file for alcohol use.  Family / Support Systems Spouse/Significant Other: N/A Children: Lane, additonal daughter, son and SIL Other Supports: N/A Anticipated Caregiver: Orthoptist (Daughter) Ability/Limitations of Caregiver: N/A Caregiver Availability: 24/7 Family Dynamics: supportive family  Social History Preferred language: English Religion: Unknown Cultural Background: Independent and alone Education: HS Health Literacy - How often do you need to have someone help you when you read instructions, pamphlets, or other written material from your doctor or pharmacy?: Patient unable to respond Writes: Yes Employment Status: Retired Public relations account executive Issues: N/A Guardian/Conservator: Technical sales engineer   Abuse/Neglect Abuse/Neglect Assessment Can Be Completed: Yes Physical Abuse: Denies Verbal Abuse: Denies Sexual Abuse: Denies Exploitation of patient/patient's resources: Denies Self-Neglect: Denies  Patient response to: Social Isolation - How often do you feel lonely or isolated from those around you?: Never  Emotional Status Recent Psychosocial Issues: coping Psychiatric History: N/A Substance Abuse History: ETOH  Patient / Family Perceptions, Expectations & Goals Pt/Family  understanding of illness & functional limitations: yes, family at bedside and daughter, Orene Desanctis via telephone Premorbid pt/family roles/activities: Independent and living alone with intermittent a Anticipated changes in roles/activities/participation: Plans to d/c home with children to provide 24/7 or to stay with daughter Pt/family expectations/goals: [place]: None Premorbid Home Care/DME Agencies: None Transportation available at discharge: family Is the patient able to respond to transportation needs?: Yes In the past 12 months, has lack of transportation kept you from medical appointments or from getting medications?: No In the past 12 months, has lack of transportation kept you from meetings, work, or from getting things needed for daily living?: No Resource referrals recommended: Neuropsychology  Discharge Planning Living Arrangements: Alone Support Systems: Children Type of Residence: Private residence Insurance Resources: Multimedia programmer (specify) Nurse, mental health MEDICARE) Financial Resources: Social Security Financial Screen Referred: No Living Expenses: Own Money Management: Patient, Family Does the patient have any problems obtaining your medications?: No Home Management: Independent previously Patient/Family Preliminary Plans: Daughter able to manage if needed Care Coordinator Barriers to Discharge: Insurance for SNF coverage Care Coordinator Anticipated Follow Up Needs: HH/OP Expected length of stay: 7-10 Days  Clinical Impression SW met with patient, daughter, SIL, son and spoke with daughter, Orene Desanctis via telephone introduced self and explained role. Sw informed patient will be ready for d/c on Saturday due to limited PT needs and no OT needs. Patient will require HH/OP follow up for SLP. Family have confirmed 24/7 supervision at discharge. Sw will have SLP FU with daughter, Orene Desanctis. No additional questions or concerns.   Dyanne Iha 02/02/2023, 12:42 PM

## 2023-02-02 NOTE — Patient Care Conference (Signed)
Inpatient RehabilitationTeam Conference and Plan of Care Update Date: 02/03/2023   Time: 12:24 PM    Patient Name: Juan [name]      Medical Record Number: [id number]  Date of Birth: 03-08-1926 Sex: Male         Room/Bed: 4M13C/4M13C-01 Payor Info: Payor: [employer] / Plan: [employer]BS MEDICARE / Product Type: *No Product type* /    Admit Date/Time:  02/01/2023  3:37 PM  Primary Diagnosis:  CVA (cerebral vascular accident)  Hospital Problems: Principal Problem:   CVA (cerebral vascular accident) [facility])    Expected Discharge Date: Expected Discharge Date: 02/04/23  Team Members Present: Physician leading conference: Dr. Alysia Penna Social Worker Present: Erlene Quan, [place] Nurse Present: Dorien Chihuahua, RN PT Present: Other (comment) America Brown, PT) OT Present: Meriel Pica, OT SLP Present: Weston Anna, SLP     Current Status/Progress Goal Weekly Team Focus  Bowel/Bladder      Continent of bowel and bladder          Swallow/Nutrition/ Hydration      N/A         ADL's   supervision with basic self care   Mod I with basic self care, supervision with complex meal preparation   pt/family education, ADL training    Mobility   independent bed mobility, sit<>stand and stand pivot transfers without AD, independent gait in hospital room, supervision longer distances and community gait as well as stair navigation   independent at ambulatory level  pt education, activity tolerance, gait training, dynamic standing balance, dynamic gait training, stair navigation, D/C planning    Communication      N/A          Safety/Cognition/ Behavioral Observations     N/A          Pain      N/A          Skin      Abrasion on left leg   Skin healing    Assess skin q shift    Discharge Planning:  Patient d/c home on Saturday with 24/7 supervision   Team Discussion: Patient is doing well overall; limited by receptive/expressive  aphasia   Patient on target to meet rehab goals: yes, currently high level physically that during OT eval he demonstrated how he stands to shower, walked over 300 ft 2x with supervision only, went up and down gym stairs with no cues or A, lifted big pots and pans from kitchen cupboards, passed 9 hole peg test and UE and vision tests. He does have very impaired receptive and expressive aphasia but follows visual commands well.   *See Care Plan and progress notes for long and short-term goals.   Revisions to Treatment Plan:  N/a   Teaching Needs: Safety, emergency preparedness, medications, dietary modifications, etc.   Current Barriers to Discharge: Decreased caregiver support  Possible Resolutions to Barriers: Family education OP SLP follow up services for higher level balance to return to likely PLOF but no DME needs      Medical Summary Current Status: BP mildly labile, poor receptive language skills  Barriers to Discharge: Other (comments)  Barriers to Discharge Comments: communication Possible Resolutions to [employer] Focus: short LOS- f/u OP SLP   Continued Need for Acute Rehabilitation Level of Care: The patient requires daily medical management by a physician with specialized training in physical medicine and rehabilitation for the following reasons: Direction of a multidisciplinary physical rehabilitation program to maximize functional independence : Yes  Medical management of patient stability for increased activity during participation in an intensive rehabilitation regime.: Yes Analysis of laboratory values and/or radiology reports with any subsequent need for medication adjustment and/or medical intervention. : Yes   I attest that I was present, lead the team conference, and concur with the assessment and plan of the team.   Dorien Chihuahua B 02/03/2023, 8:49 AM

## 2023-02-02 NOTE — Progress Notes (Signed)
Inpatient Rehabilitation Center Individual Statement of Services  Patient Name:  Juan [name]  Date:  02/02/2023  Welcome to the [facility].  Our goal is to provide you with an individualized program based on your diagnosis and situation, designed to meet your specific needs.  With this comprehensive rehabilitation program, you will be expected to participate in at least 3 hours of rehabilitation therapies Monday-Friday, with modified therapy programming on the weekends.  Your rehabilitation program will include the following services:  Physical Therapy (PT), Occupational Therapy (OT), Speech Therapy (ST), 24 hour per day rehabilitation nursing, Therapeutic Recreaction (TR), Neuropsychology, Care Coordinator, Rehabilitation Medicine, Nutrition Services, Pharmacy Services, and Other  Weekly team conferences will be held on Wednesdays to discuss your progress.  Your Inpatient Rehabilitation Care Coordinator will talk with you frequently to get your input and to update you on team discussions.  Team conferences with you and your family in attendance may also be held.  Expected length of stay:  7-10 Days  Overall anticipated outcome:  supervision to mod I   Depending on your progress and recovery, your program may change. Your Inpatient Rehabilitation Care Coordinator will coordinate services and will keep you informed of any changes. Your Inpatient Rehabilitation Care Coordinator's name and contact numbers are listed  below.  The following services may also be recommended but are not provided by the [facility]:   [place] will be made to provide these services after discharge if needed.  Arrangements include referral to agencies that provide these services.  Your insurance has been verified to be:   [employer] Your primary doctor is:  NO PCP  Pertinent information  will be shared with your doctor and your insurance company.  Inpatient Rehabilitation Care Coordinator:  Erlene Quan, [place] or [phone]  Information discussed with and copy given to patient by: Dyanne Iha, 02/02/2023, 12:12 PM

## 2023-02-02 NOTE — Plan of Care (Signed)
  Problem: RH Balance Goal: LTG Patient will maintain dynamic standing balance (PT) Description: LTG:  Patient will maintain dynamic standing balance with assistance during mobility activities (PT) Flowsheets (Taken 02/02/2023 1850) LTG: Pt will maintain dynamic standing balance during mobility activities with:: Independent   Problem: RH Car Transfers Goal: LTG Patient will perform car transfers with assist (PT) Description: LTG: Patient will perform car transfers with assistance (PT). Flowsheets (Taken 02/02/2023 1850) LTG: Pt will perform car transfers with assist:: Independent   Problem: RH Ambulation Goal: LTG Patient will ambulate in controlled environment (PT) Description: LTG: Patient will ambulate in a controlled environment, # of feet with assistance (PT). Flowsheets (Taken 02/02/2023 1850) LTG: Pt will ambulate in controlled environ  assist needed:: Independent LTG: Ambulation distance in controlled environment: 138ft Goal: LTG Patient will ambulate in home environment (PT) Description: LTG: Patient will ambulate in home environment, # of feet with assistance (PT). Flowsheets (Taken 02/02/2023 1850) LTG: Pt will ambulate in home environ  assist needed:: Independent LTG: Ambulation distance in home environment: 27ft   Problem: RH Stairs Goal: LTG Patient will ambulate up and down stairs w/assist (PT) Description: LTG: Patient will ambulate up and down # of stairs with assistance (PT) Flowsheets (Taken 02/02/2023 1850) LTG: Pt will ambulate up/down stairs assist needed:: Independent with assistive device LTG: Pt will  ambulate up and down number of stairs: 4 steps using HRs

## 2023-02-02 NOTE — Plan of Care (Signed)
  Problem: RH Comprehension Communication Goal: LTG Patient will comprehend basic/complex auditory (SLP) Description: LTG: Patient will comprehend basic/complex auditory information with cues (SLP). Flowsheets (Taken 02/02/2023 1534) LTG: Patient will comprehend: Basic auditory information LTG: Patient will comprehend auditory information with cueing (SLP): Moderate Assistance - Patient 50 - 74%   Problem: RH Expression Communication Goal: LTG Patient will verbally express basic/complex needs(SLP) Description: LTG:  Patient will verbally express basic/complex needs, wants or ideas with cues  (SLP) Flowsheets (Taken 02/02/2023 1534) LTG: Patient will verbally express basic/complex needs, wants or ideas (SLP): Moderate Assistance - Patient 50 - 74% Goal: LTG Patient will increase word finding of common (SLP) Description: LTG:  Patient will increase word finding of common objects/daily info/abstract thoughts with cues using compensatory strategies (SLP). Flowsheets (Taken 02/02/2023 1534) LTG: Patient will increase word finding of common (SLP): Minimal Assistance - Patient > 75%

## 2023-02-02 NOTE — Evaluation (Signed)
Speech Language Pathology Assessment and Plan  Patient Details  Name: GAMBIT GOLIA MRN: [id number] Date of Birth: Jan 30, 1926  SLP Diagnosis: Aphasia  Rehab Potential: Good ELOS: 1-2 days    Today's Date: 02/02/2023 SLP Individual Time: [id number] SLP Individual Time Calculation (min): 60 min   Hospital Problem: Principal Problem:   CVA (cerebral vascular accident) [facility])  Past Medical History:  Past Medical History:  Diagnosis Date   Arrhythmia    Diverticulitis 2004   Hyperlipidemia    Prostate cancer ([place])    Shingles outbreak    LEFT CHEST WALL   Past Surgical History:  Past Surgical History:  Procedure Laterality Date   APPENDECTOMY      Assessment / Plan / Recommendation Clinical Impression Patient  is a 89 year old male with history of prostate cancer, CKD  IIIa, PAF who was admitted on 01/27/23 with garbled speech, confusion and agitation.  He was on aspirin prior to admission.  CT head done showing diminished opacification anterior L-MCA branches.  MRI brain done showing moderate acute L-MCA territory involving left temporal and temporo-occipital region with suspected petechial blood products in left temporal lobe and acute left cerebellar infarct with few punctate areas in right occipital and right cerebellum felt to be central thromboembolic in origin. 2D echo showed EF 55-60% with aortic dilatation and mild to moderate aortic calcification. He was loaded with Keppra, started on ASA and EEG done which showed slowing in left temporal region was negative for seizures.  He has had issues with agitation requiring Haldol and low dose seroquel added to help with sleep. CIR recommended due to functional decline and patient admitted 02/01/23.  Patient presents with a moderate-severe aphasia impacting all four modalities of language. Patient's auditory comprehension is characterized by decreased yes/no accuracy with basic biographical information, impaired ability to follow  1-step commands, especially without context. Patient's verbal expression is characterized by impaired ability to complete confrontational and responsive naming tasks, impaired repetition, and poor sentence completion. Patient spontaneously verbalizes at the phrase level and his verbal expression consists of perseveration with phonemic paraphasias with minimal awareness of errors.  As an unfamiliar communication partner, SLP was able to comprehend ~60% of message regarding his daily routine. Patient also with spelling errors during structured written expression tasks at the word level.  Throughout session, patient responded best with use of gestures in addition to simple verbal commands.  Patient would benefit from skilled SLP intervention to maximize his functional communication prior to discharge. Anticipate patient will have a very short length of stay and will require 24 hour supervision at discharge due to communication deficits and would benefit from f/u outpatient SLP services.    Skilled Therapeutic Interventions          Administered a cognitive-linguistic evaluation, please see above for details.   SLP Assessment  Patient will need skilled [place] during CIR admission    Recommendations  Oral Care Recommendations: Oral care BID Patient destination: Home Follow up Recommendations: Outpatient SLP;24 hour supervision/assistance Equipment Recommended: None recommended by SLP    SLP Frequency 1 to 3 out of 7 days   SLP Duration  SLP Intensity  SLP Treatment/Interventions 1-2 days  Minumum of 1-2 x/day, 30 to 90 minutes  Cueing hierarchy;Environmental controls;Functional tasks;Patient/family education;Speech/Language facilitation;Therapeutic Activities    Pain Pain Assessment Pain Scale: Faces Pain Score: 0-No pain Faces Pain Scale: No hurt  Prior Functioning Type of Home: House  Lives With: Alone Available Help at Discharge: Family;Available 24  hours/day (per SW family able to provide 24hr support) Vocation: Retired  Programmer, systems Overall Cognitive Status: Difficult to assess Arousal/Alertness: Awake/alert Orientation Level: Oriented to person;Oriented to place;Oriented to time Awareness: Impaired Awareness Impairment: Emergent impairment Safety/Judgment: Impaired Comments: cognition difficult to assess secondary to patient's global aphasia.  Comprehension Auditory Comprehension Overall Auditory Comprehension: Impaired Yes/No Questions: Impaired Basic Biographical Questions: 26-50% accurate Basic Immediate Environment Questions: 25-49% accurate Commands: Impaired One Step Basic Commands: 25-49% accurate Conversation: Simple Interfering Components: Processing speed;Hearing EffectiveTechniques: Extra processing time;Visual/Gestural cues;Slowed speech;Stressing words Visual Recognition/Discrimination Discrimination: Not tested Reading Comprehension Reading Status: Not tested Expression Expression Primary Mode of Expression: Verbal Verbal Expression Overall Verbal Expression: Impaired Initiation: No impairment Automatic Speech: Name;Social Response Level of Generative/Spontaneous Verbalization: Phrase Repetition: Impaired Level of Impairment: Word level Naming: Impairment Responsive: 0-25% accurate Confrontation: Impaired Convergent: Not tested Divergent: Not tested Verbal Errors: Phonemic paraphasias;Not aware of errors;Semantic paraphasias Pragmatics: No impairment Effective Techniques: Sentence completion;Phonemic cues;Semantic cues Non-Verbal Means of Communication: Not applicable Written Expression Dominant Hand: Right Written Expression: Exceptions to [facility] Oral Motor Oral Motor/Sensory Function Overall Oral Motor/Sensory Function: Within functional limits Motor Speech Overall Motor Speech: Appears within functional limits for tasks assessed  Care Tool Care Tool Cognition Ability to hear  (with hearing aid or hearing appliances if normally used Ability to hear (with hearing aid or hearing appliances if normally used): 1. Minimal difficulty - difficulty in some environments (e.g. when person speaks softly or setting is noisy)   Expression of Ideas and Wants Expression of Ideas and Wants: 2. Frequent difficulty - frequently exhibits difficulty with expressing needs and ideas   Understanding Verbal and Non-Verbal Content Understanding Verbal and Non-Verbal Content: 2. Sometimes understands - understands only basic conversations or simple, direct phrases. Frequently requires cues to understand  Memory/Recall Ability Memory/Recall Ability : Current season;That he or she is in a hospital/hospital unit    Short Term Goals: Week 1: SLP Short Term Goal 1 (Week 1): STGs=LTGs due to ELOS  Refer to Care Plan for Long Term Goals  Recommendations for other services: None   Discharge Criteria: Patient will be discharged from SLP if patient refuses treatment 3 consecutive times without medical reason, if treatment goals not met, if there is a change in medical status, if patient makes no progress towards goals or if patient is discharged from hospital.  The above assessment, treatment plan, treatment alternatives and goals were discussed and mutually agreed upon: by patient and by family  Manjot Beumer 02/02/2023, 3:58 PM

## 2023-02-02 NOTE — Plan of Care (Signed)
  Problem: RH Bathing Goal: LTG Patient will bathe all body parts with assist levels (OT) Description: LTG: Patient will bathe all body parts with assist levels (OT) Flowsheets (Taken 02/02/2023 1310) LTG: Pt will perform bathing with assistance level/cueing: Independent   Problem: RH Dressing Goal: LTG Patient will perform lower body dressing w/assist (OT) Description: LTG: Patient will perform lower body dressing with assist, with/without cues in positioning using equipment (OT) Flowsheets (Taken 02/02/2023 1310) LTG: Pt will perform lower body dressing with assistance level of: Independent   Problem: RH Toileting Goal: LTG Patient will perform toileting task (3/3 steps) with assistance level (OT) Description: LTG: Patient will perform toileting task (3/3 steps) with assistance level (OT)  Flowsheets (Taken 02/02/2023 1310) LTG: Pt will perform toileting task (3/3 steps) with assistance level: Independent   Problem: RH Toilet Transfers Goal: LTG Patient will perform toilet transfers w/assist (OT) Description: LTG: Patient will perform toilet transfers with assist, with/without cues using equipment (OT) Flowsheets (Taken 02/02/2023 1310) LTG: Pt will perform toilet transfers with assistance level of: Independent   Problem: RH Tub/Shower Transfers Goal: LTG Patient will perform tub/shower transfers w/assist (OT) Description: LTG: Patient will perform tub/shower transfers with assist, with/without cues using equipment (OT) Flowsheets (Taken 02/02/2023 1310) LTG: Pt will perform tub/shower stall transfers with assistance level of: Independent LTG: Pt will perform tub/shower transfers from: Walk in shower

## 2023-02-03 ENCOUNTER — Other Ambulatory Visit (HOSPITAL_COMMUNITY): Payer: Self-pay

## 2023-02-03 DIAGNOSIS — I6602 Occlusion and stenosis of left middle cerebral artery: Secondary | ICD-10-CM | POA: Diagnosis not present

## 2023-02-03 MED ORDER — APIXABAN 5 MG PO TABS
5.0000 mg | ORAL_TABLET | Freq: Two times a day (BID) | ORAL | 0 refills | Status: DC
  Filled 2023-02-03: qty 60, 30d supply, fill #0

## 2023-02-03 MED ORDER — QUETIAPINE FUMARATE 25 MG PO TABS
25.0000 mg | ORAL_TABLET | Freq: Every day | ORAL | 0 refills | Status: DC
  Filled 2023-02-03: qty 30, 30d supply, fill #0

## 2023-02-03 MED ORDER — ROSUVASTATIN CALCIUM 20 MG PO TABS
20.0000 mg | ORAL_TABLET | Freq: Every day | ORAL | 0 refills | Status: DC
  Filled 2023-02-03: qty 30, 30d supply, fill #0

## 2023-02-03 NOTE — Progress Notes (Signed)
Occupational Therapy Session Note  Patient Details  Name: Juan [name] MRN: [id number] Date of Birth: 05-29-1926  Today's Date: 02/03/2023 OT Individual Time: 1420-1530 OT Individual Time Calculation (min): 70 min    Short Term Goals: Week 1:  OT Short Term Goal 1 (Week 1): STGs = LTGs  Skilled Therapeutic Interventions/Progress Updates:  Pt greeted seated in recliner, pt agreeable to OT intervention. Pt donned knee brace from recliner independently. Session focus on dynamic balance via IADL tasks, increasing overall activity tolerance, BUE/BLE strength/endurance and global strengthening/conditioning.   Pt completed all functional ambulation during session with no AD independently. Pt completed IADL tasks in kitchen including putting items away in cabinets with pt instructed to read labels on cabinets and problem solve where items belonged. D/t aphasia pt needed MIN verbal cues to problem solve correct placement of items. With 2 options provided,pt did well with options provided.   Pt able to ambulate around apt with no AD and collect clothing from floor level and then hang items in closet with an emphasis on dynamic standing balance. Pt completed task independently. Pt ambulated to day room and completed IADL laundry task where pt able to carry laundry basket to laundry room, place towels in washer/dryer and then stand on airex to fold towels to challenge dynamic balance with supervision.   Pt completed other various therapeutic activities to challenge balance including agility ladder, ambulating while bouncing ball/balancing bean bags on cones. Pt completed all balance tasks with no LOB and supervision.   Pt completed 3 mins of seated BUE strengthening therex with pt uisng 3 lb dowel rod to toss beach ball back and forth for 3 mins to facilitate improved global endurance for higher level functional mobility tasks.   Pt completed 2x10 sit>stands from EOM while holding onto 3 lb dowel  rod and pressing rod OH to facilitate improved global strength/endurance. Pt also completed standing therex while holding 3 lb rod including standing chest presses and bicep curls for 10 reps each.   Pt did report fatigue at end of session therefore finished session working on word finding with pt shown flashcard with a picture of a common item on it such as, broom, cereal, bed etc with pt asked to state word. Pt did well with task able to state ~ 95% of words correctly with increased time. Pt noted to always demo use of word first and did well when 2 choices provided.   Ended session with pt seated in recliner, pt independent in room.   Therapy Documentation Precautions:  Precautions Precautions: Other (comment) Precaution Comments: global aphasia Restrictions Weight Bearing Restrictions: No  Pain: no indications of pain noted.    Therapy/Group: Individual Therapy  Precious Haws 02/03/2023, 3:55 PM

## 2023-02-03 NOTE — Plan of Care (Signed)
  Problem: RH Comprehension Communication Goal: LTG Patient will comprehend basic/complex auditory (SLP) Description: LTG: Patient will comprehend basic/complex auditory information with cues (SLP). Outcome: Completed/Met   Problem: RH Expression Communication Goal: LTG Patient will verbally express basic/complex needs(SLP) Description: LTG:  Patient will verbally express basic/complex needs, wants or ideas with cues  (SLP) Outcome: Completed/Met Goal: LTG Patient will increase word finding of common (SLP) Description: LTG:  Patient will increase word finding of common objects/daily info/abstract thoughts with cues using compensatory strategies (SLP). Outcome: Completed/Met   

## 2023-02-03 NOTE — Progress Notes (Signed)
PROGRESS NOTE   Subjective/Complaints:  No issues in PT, amb with supervision, per family not HOH ROS- limited by aphasia  Objective:   No results found. Recent Labs    02/02/23 0530  WBC 9.2  HGB 13.6  HCT 39.6  PLT 219    Recent Labs    02/02/23 0530  NA 136  K 4.1  CL 102  CO2 25  GLUCOSE 94  BUN 19  CREATININE 1.29*  CALCIUM 8.8*     Intake/Output Summary (Last 24 hours) at 02/03/2023 0827 Last data filed at 02/02/2023 1801 Gross per 24 hour  Intake 413 ml  Output --  Net 413 ml         Physical Exam: Vital Signs Blood pressure 136/70, pulse 75, temperature 98.1 F (36.7 C), resp. rate 18, height 5\' 10"  (1.778 m), weight 94.5 kg, SpO2 96 %.   General: No acute distress Mood and affect are appropriate Heart: Regular rate and rhythm no rubs murmurs or extra sounds Lungs: Clear to auscultation, breathing unlabored, no rales or wheezes Abdomen: Positive bowel sounds, soft nontender to palpation, nondistended Extremities: No clubbing, cyanosis, or edema Skin: No evidence of breakdown, no evidence of rash Neurologic: Cranial nerves II through XII intact, motor strength is 4/5 in bilateral deltoid, bicep, tricep, grip, hip flexor, knee extensors, ankle dorsiflexor and plantar flexor Sensory exam limited by aphasia  Cerebellar exam normal finger to nose to finger as well as heel to shin in bilateral upper and lower extremities Musculoskeletal: Full range of motion in all 4 extremities. No joint swelling   Assessment/Plan: 1. Functional deficits which require 3+ hours per day of interdisciplinary therapy in a comprehensive inpatient rehab setting. Physiatrist is providing close team supervision and 24 hour management of active medical problems listed below. Physiatrist and rehab team continue to assess barriers to discharge/monitor patient progress toward functional and medical goals  Care  Tool:  Bathing    Body parts bathed by patient: Right arm, Left arm, Chest, Abdomen, Front perineal area, Buttocks, Right upper leg, Left upper leg, Face, Left lower leg, Right lower leg         Bathing assist Assist Level: Supervision/Verbal cueing     Upper Body Dressing/Undressing Upper body dressing   What is the patient wearing?: Pull over shirt    Upper body assist Assist Level: Independent    Lower Body Dressing/Undressing Lower body dressing      What is the patient wearing?: Underwear/pull up, Pants     Lower body assist Assist for lower body dressing: Supervision/Verbal cueing     Toileting Toileting    Toileting assist Assist for toileting: Supervision/Verbal cueing     Transfers Chair/bed transfer  Transfers assist     Chair/bed transfer assist level: Independent     Locomotion Ambulation   Ambulation assist      Assist level: Supervision/Verbal cueing Assistive device: No Device Max distance: 258ft   Walk 10 feet activity   Assist     Assist level: Independent Assistive device: No Device   Walk 50 feet activity   Assist    Assist level: Supervision/Verbal cueing Assistive device: No Device    Walk  150 feet activity   Assist    Assist level: Supervision/Verbal cueing Assistive device: No Device    Walk 10 feet on uneven surface  activity   Assist     Assist level: Supervision/Verbal cueing Assistive device: Other (comment) (railing)   Wheelchair     Assist Is the patient using a wheelchair?: No             Wheelchair 50 feet with 2 turns activity    Assist            Wheelchair 150 feet activity     Assist          Blood pressure 136/70, pulse 75, temperature 98.1 F (36.7 C), resp. rate 18, height 5\' 10"  (1.778 m), weight 94.5 kg, SpO2 96 %.  Medical Problem List and Plan: 1. Functional deficits secondary to Acute left MCA dn cerebellar ischemic infarct, likely cardioembolic  in the setting of atrial fibrillation not on anticoagulation Receptive aphasia              -patient may shower             -ELOS/Goals: 3/30 PT/OT/SLP sup to mod I- main deficit receptive language             -Admit to CIR, PT OT SLP evals  2.  Antithrombotics: -DVT/anticoagulation:  Pharmaceutical: Eliquis             -antiplatelet therapy: N/A 3. Pain Management:  Tylenol prn.  4. Mood/Behavior/Sleep: LCSW to follow for evaluation and support when appropraite             --Continue Seroquel--may need to increase to 50 mg as off haldol now. Nsg has not documented agitation at night              -antipsychotic agents: N/A 5. Neuropsych/cognition: This patient is not capable of making decisions on his own behalf. 6. Skin/Wound Care: Routine pressure relief measures.  7. Fluids/Electrolytes/Nutrition: Monitor I/O. Check CMET in am 8. PAF: Monitor HR TID and for symptoms with increase in activity. --continue Eliquis BID. May need BB if tachycardia and elevated BP persist , check ortho vitals  9. Delirium. Has been getting Haldol 2 mg IV at bedtime since admission for agitation             --sleep wake chart.              --family to spend nights to transition patient to CIR.  10. HTN: Long-term goal normotensive.  Monitor with activity Vitals:   02/02/23 2041 02/03/23 0625  BP: (!) 170/88 136/70  Pulse: 89 75  Resp: 17 18  Temp: 97.6 F (36.4 C) 98.1 F (36.7 C)  SpO2: 98% 96%    11.  Hyperlipidemia: Continue Crestor 20 mg 12.  CKD IIIa.  Recheck labs tomorrow    LOS: 2 days A FACE TO FACE EVALUATION WAS PERFORMED  Charlett Blake 02/03/2023, 8:27 AM

## 2023-02-03 NOTE — IPOC Note (Signed)
Overall Plan of Care [facility]) Patient Details Name: Juan [name] MRN: [id number] DOB: 19-Jan-1926  Admitting Diagnosis: CVA (cerebral vascular accident)  Hospital Problems: Principal Problem:   CVA (cerebral vascular accident) [facility])     Functional Problem List: Nursing    PT Balance, Pain, Motor  OT Cognition, Other (Comment) (aphasia)  SLP Linguistic  TR         Basic ADL's: OT Dressing, Toileting, Bathing     Advanced  ADL's: OT       Transfers: PT Car, Floor  OT Toilet, Tub/Shower     Locomotion: PT Ambulation, Stairs     Additional Impairments: OT None  SLP Communication comprehension, expression    TR      Anticipated Outcomes Item Anticipated Outcome  Self Feeding no goal, pt is independent  Swallowing      Basic self-care  Independent  Toileting  independent   Bathroom Transfers independent  Bowel/Bladder     Transfers  independent  Locomotion  independent  Communication  Mod-Max A  Cognition     Pain     Safety/Judgment      Therapy Plan: PT Intensity: Minimum of 1-2 x/day ,45 to 90 minutes PT Frequency: 5 out of 7 days PT Duration Estimated Length of Stay: 1 day OT Intensity: Minimum of 1-2 x/day, 45 to 90 minutes OT Frequency: 5 out of 7 days OT Duration/Estimated Length of Stay: 1-2 days SLP Intensity: Minumum of 1-2 x/day, 30 to 90 minutes SLP Frequency: 1 to 3 out of 7 days SLP Duration/Estimated Length of Stay: 1-2 days   Team Interventions: Nursing Interventions    PT interventions Ambulation/gait training, Community reintegration, Neuromuscular re-education, Stair training, UE/LE Strength taining/ROM, Discharge planning, Training and development officer, Pain management, Therapeutic Activities, UE/LE Coordination activities, Cognitive remediation/compensation, Disease management/prevention, Functional mobility training, Patient/family education  OT Interventions Self Care/advanced ADL retraining, Therapeutic Activities,  Therapeutic Exercise, Functional mobility training, Patient/family education  SLP Interventions Cueing hierarchy, Environmental controls, Functional tasks, Patient/family education, Speech/Language facilitation, Therapeutic Activities  TR Interventions    SW/CM Interventions Discharge Planning, Psychosocial Support, Patient/Family Education, Disease Management/Prevention   Barriers to Discharge MD  Medical stability and communication  Nursing      PT      OT      SLP      SW Insurance for SNF coverage     Team Discharge Planning: Destination: PT-Home ,OT- Home , SLP-Home Projected Follow-up: PT-Outpatient PT, 24 hour supervision/assistance, OT-  None, SLP-Outpatient SLP, 24 hour supervision/assistance Projected Equipment Needs: PT-None recommended by PT, OT- None recommended by OT, SLP-None recommended by SLP Equipment Details: PT- , OT-  Patient/family involved in discharge planning: PT- Patient,  OT-Patient, SLP-Patient, Family member/caregiver  MD ELOS: 3-5d Medical Rehab Prognosis:  Good Assessment: The patient has been admitted for CIR therapies with the diagnosis of CVA. The team will be addressing functional mobility, strength, stamina, balance, safety, adaptive techniques and equipment, self-care, bowel and bladder mgt, patient and caregiver education, HTN managment. Goals have been set at Mod I/Sup. Anticipated discharge destination is Home.        See Team Conference Notes for weekly updates to the plan of care

## 2023-02-03 NOTE — Discharge Summary (Signed)
Physician Discharge Summary  Patient ID: Juan [name] MRN: [id number] DOB/AGE: 05-28-1926 89 y.o.  Admit date: 02/01/2023 Discharge date: 02/04/2023  Discharge Diagnoses:  Principal Problem:   CVA (cerebral vascular accident) [facility] Active Problems:   PAF (paroxysmal atrial fibrillation) ([place])   Hyperlipidemia   Stage 3a chronic kidney disease (CKD) ([place])   Discharged Condition: stable  Significant Diagnostic Studies: ECHOCARDIOGRAM COMPLETE  Result Date: 01/28/2023    ECHOCARDIOGRAM REPORT   Patient Name:   Juan [name] Date of Exam: 01/28/2023 Medical Rec #:  [id number]         Height:       70.0 in Accession #:    [id number]        Weight:       216.0 lb Date of Birth:  [birth year]        BSA:          2.157 m Patient Age:    89 years          BP:           145/83 mmHg Patient Gender: M                 HR:           89 bpm. Exam Location:  Inpatient Procedure: 2D Echo, Cardiac Doppler and Color Doppler Indications:    stroke  History:        Patient has no prior history of Echocardiogram examinations.                 Chronic kidney disease. Cancer; Arrythmias:Paroxysmal A-fib.  Sonographer:    Johny Chess RDCS Referring Phys: [id number] TIMOTHY S OPYD  Sonographer Comments: Image acquisition challenging due to uncooperative patient. IMPRESSIONS  1. Left ventricular ejection fraction, by estimation, is 55 to 60%. The left ventricle has normal function. The left ventricle has no regional wall motion abnormalities. There is mild concentric left ventricular hypertrophy. Left ventricular diastolic parameters are indeterminate.  2. Right ventricular systolic function is normal. The right ventricular size is normal. There is moderately elevated pulmonary artery systolic pressure. The estimated right ventricular systolic pressure is 123XX123 mmHg.  3. Left atrial size was mildly dilated.  4. The mitral valve is grossly normal. Mild mitral valve regurgitation.  5. The aortic valve is tricuspid.  There is mild calcification of the aortic valve. Aortic valve regurgitation is mild. Aortic valve sclerosis/calcification is present, without any evidence of aortic stenosis.  6. Aortic dilatation noted. There is mild dilatation of the ascending aorta, measuring 40 mm.  7. The inferior vena cava is dilated in size with <50% respiratory variability, suggesting right atrial pressure of 15 mmHg. Comparison(s): No prior Echocardiogram. FINDINGS  Left Ventricle: Left ventricular ejection fraction, by estimation, is 55 to 60%. The left ventricle has normal function. The left ventricle has no regional wall motion abnormalities. The left ventricular internal cavity size was normal in size. There is  mild concentric left ventricular hypertrophy. Left ventricular diastolic function could not be evaluated due to atrial fibrillation. Left ventricular diastolic parameters are indeterminate. Right Ventricle: The right ventricular size is normal. No increase in right ventricular wall thickness. Right ventricular systolic function is normal. There is moderately elevated pulmonary artery systolic pressure. The tricuspid regurgitant velocity is 3.28 m/s, and with an assumed right atrial pressure of 15 mmHg, the estimated right ventricular systolic pressure is 123XX123 mmHg. Left Atrium: Left atrial size was mildly dilated. Right Atrium: Right atrial size was normal in size.  Pericardium: There is no evidence of pericardial effusion. Presence of epicardial fat layer. Mitral Valve: The mitral valve is grossly normal. Mild mitral annular calcification. Mild mitral valve regurgitation. Tricuspid Valve: The tricuspid valve is grossly normal. Tricuspid valve regurgitation is mild. Aortic Valve: The aortic valve is tricuspid. There is mild calcification of the aortic valve. There is mild to moderate aortic valve annular calcification. Aortic valve regurgitation is mild. Aortic valve sclerosis/calcification is present, without any evidence of  aortic stenosis. Pulmonic Valve: The pulmonic valve was grossly normal. Pulmonic valve regurgitation is trivial. Aorta: Aortic dilatation noted. There is mild dilatation of the ascending aorta, measuring 40 mm. Venous: The inferior vena cava is dilated in size with less than 50% respiratory variability, suggesting right atrial pressure of 15 mmHg. IAS/Shunts: No atrial level shunt detected by color flow Doppler.  LEFT VENTRICLE PLAX 2D LVIDd:         5.00 cm LVIDs:         3.50 cm LV PW:         1.10 cm LV IVS:        1.10 cm LVOT diam:     2.30 cm LVOT Area:     4.15 cm  RIGHT VENTRICLE          IVC RV Basal diam:  3.40 cm  IVC diam: 3.00 cm TAPSE (M-mode): 1.4 cm LEFT ATRIUM              Index        RIGHT ATRIUM           Index LA diam:        5.30 cm  2.46 cm/m   RA Area:     23.80 cm LA Vol (A2C):   122.0 ml 56.56 ml/m  RA Volume:   73.10 ml  33.89 ml/m LA Vol (A4C):   48.1 ml  22.30 ml/m LA Biplane Vol: 78.1 ml  36.21 ml/m   AORTA Ao Root diam: 3.80 cm Ao Asc diam:  4.00 cm TRICUSPID VALVE TR Peak grad:   43.0 mmHg TR Vmax:        328.00 cm/s  SHUNTS Systemic Diam: 2.30 cm Rozann Lesches MD Electronically signed by Rozann Lesches MD Signature Date/Time: 01/28/2023/12:46:04 PM    Final    EEG adult  Result Date: 01/28/2023 Juan Havens, MD     01/28/2023 10:06 AM Patient Name: Juan [name] MRN: [id number] Epilepsy Attending: [name] Referring Physician/Provider: [name], MD Date: 01/28/2023 Duration: 26.39 mins Patient history: 89yo M with ams. EEG to evaluate for seizure Level of alertness: Awake AEDs during EEG study: Ativan Technical aspects: This EEG study was done with scalp electrodes positioned according to the 10-20 International system of electrode placement. Electrical activity was reviewed with band pass filter of 1-70Hz , sensitivity of 7 uV/mm, display speed of 52mm/sec with a 60Hz  notched filter applied as appropriate. EEG data were recorded continuously and  digitally stored.  Video monitoring was available and reviewed as appropriate. Description: The posterior dominant rhythm consists of 8 Hz activity of moderate voltage (25-35 uV) seen predominantly in posterior head regions, symmetric and reactive to eye opening and eye closing. EEG showed continuous 3 to 6 Hz theta-delta slowing in left temporal region. Hyperventilation and photic stimulation were not performed.   ABNORMALITY - Continuous slow, left temporal region IMPRESSION: This study is suggestive of cortical dysfunction arising from left temporal region likely secondary to underlying structural abnormality. No seizures or epileptiform  discharges were seen throughout the recording. Juan [name]   MR BRAIN WO CONTRAST  Result Date: 01/28/2023 CLINICAL DATA:  Follow-up examination for stroke. EXAM: MRI HEAD WITHOUT CONTRAST TECHNIQUE: Multiplanar, multiecho pulse sequences of the brain and surrounding structures were obtained without intravenous contrast. COMPARISON:  Prior CTs from 01/27/2023. FINDINGS: Brain: Examination is technically limited as the patient was unable to tolerate the full length of the study. Diffusion-weighted sequences and sagittal T1 weighted sequence only were performed. Age-related cerebral atrophy. Encephalomalacia at the anterior right frontal lobe consistent with a chronic right MCA territory infarct. Confluent restricted diffusion involving the left temporal lobe and temporal occipital region, consistent with an acute left MCA territory infarct (series 5, image 69). Mild patchy involvement of the left insular cortex. Additional small volume cortical involvement at the high posterior left parietal lobe noted (series 5, image 81). Additional restricted diffusion involving the superior left cerebellar hemisphere, consistent with an evolving left superior cerebellar artery infarct (series 5, image 60). Few additional punctate cortical infarcts noted at the right occipital lobe  (series 5, images 64, 66). Additional punctate right cerebellar infarct (series 5, image 56). Overall, given the various vascular distributions involved, a central thromboembolic etiology is suspected. No significant mass effect. Evaluation for associated blood products limited on this limited exam, however, there are suspected associated petechial blood products at the left temporal lobe (series 5, image 69). No associated hemorrhage visible on prior CT. No other acute or subacute ischemia. Gray-white matter differentiation otherwise grossly maintained. No visible mass lesion. No significant mass effect or midline shift. No hydrocephalus. No extra-axial fluid collection. Pituitary gland and suprasellar region grossly within normal limits. Vascular: Major intracranial vascular flow voids are not well assessed on this limited exam. Skull and upper cervical spine: Craniocervical junction grossly within normal limits. Bone marrow signal intensity within normal limits. No scalp soft tissue abnormality. Sinuses/Orbits: Globes orbital soft tissues demonstrate no obvious abnormality. Chronic paranasal sinus disease, better appreciated on prior CT. Other: None. IMPRESSION: 1. Technically limited and truncated exam due to the patient's inability to tolerate the full length of the study. 2. Moderate-sized acute left MCA territory infarct involving the left temporal lobe/temporoccipital region. Suspected associated petechial blood products at the left temporal lobe. 3. Additional acute left superior cerebellar artery infarct, with a few additional punctate infarcts involving the right occipital lobe and right cerebellum. Given the various vascular distributions involved, a central thromboembolic etiology is suspected. 4. Chronic right frontal lobe infarct, right MCA distribution. Electronically Signed   By: Jeannine Boga M.D.   On: 01/28/2023 03:52   CT ANGIO HEAD NECK W WO CM W PERF (CODE STROKE)  Result Date:  01/27/2023 CLINICAL DATA:  Stroke suspected EXAM: CT ANGIOGRAPHY HEAD AND NECK TECHNIQUE: Multidetector CT imaging of the head and neck was performed using the standard protocol during bolus administration of intravenous contrast. Multiplanar CT image reconstructions and MIPs were obtained to evaluate the vascular anatomy. Carotid stenosis measurements (when applicable) are obtained utilizing NASCET criteria, using the distal internal carotid diameter as the denominator. RADIATION DOSE REDUCTION: This exam was performed according to the departmental dose-optimization program which includes automated exposure control, adjustment of the mA and/or kV according to patient size and/or use of iterative reconstruction technique. CONTRAST:  14mL OMNIPAQUE IOHEXOL 350 MG/ML SOLN COMPARISON:  No prior CTA available, correlation is made with 01/27/2023 CT head FINDINGS: Evaluation is significantly limited by motion artifact. CT HEAD FINDINGS For noncontrast findings, please see same day CT head.  CTA NECK FINDINGS Aortic arch: Two-vessel arch with a common origin of the brachiocephalic and left common carotid arteries. Imaged portion shows no evidence of aneurysm or dissection. No significant stenosis of the major arch vessel origins. Aortic atherosclerosis. Right carotid system: No definite stenosis at the bifurcation or in the ICA. Left carotid system: No definite stenosis at the bifurcation or in the ICA. Vertebral arteries: Evaluation of the V1 and majority of the V2 segments is limited by motion. No significant stenosis in the distal V2 or V3 segment. Skeleton: No definite acute osseous abnormality, although evaluation is motion limited. Other neck: No acute finding. Upper chest: No focal pulmonary opacity or pleural effusion. Review of the MIP images confirms the above findings CTA HEAD FINDINGS Anterior circulation: Both internal carotid arteries are patent to the termini, with calcifications but without significant  stenosis. A1 segments patent. Normal anterior communicating artery. Patent A 2 segments. More distal ACA segments are unable to be evaluated due to motion. No M1 stenosis or occlusion. Proximal M2 segments are patent. Evaluation of more distal MCA branches is limited by motion, however there is a suggestion of slightly diminished opacification of the anterior left MCA branches compared to anterior right MCA branches (series 19, image 19). Posterior circulation: Vertebral arteries patent to the vertebrobasilar junction with mild stenosis in the mid to distal V4. Basilar patent to its distal aspect, with mild stenosis in the mid to distal basilar artery. Superior cerebellar arteries patent proximally. Patent P1 segments. Patent P2 segments. More distal PCA segment evaluation is limited by motion. Venous sinuses: As permitted by contrast timing, patent. Anatomic variants: None significant. Review of the MIP images confirms the above findings IMPRESSION: 1. Significantly motion degraded exam, with no definite proximal intracranial large vessel occlusion. There is a suggestion of slightly diminished opacification of the anterior left MCA branches compared to the anterior right MCA branches. 2. No hemodynamically significant stenosis in the neck. 3. Aortic atherosclerosis. Aortic Atherosclerosis (ICD10-I70.0). These findings were discussed by telephone on 01/27/2023 at 6:20 pm with provider ASHISH ARORA . Electronically Signed   By: Merilyn Baba M.D.   On: 01/27/2023 18:39   CT HEAD CODE STROKE WO CONTRAST  Result Date: 01/27/2023 CLINICAL DATA:  Code stroke.  Altered mental status EXAM: CT HEAD WITHOUT CONTRAST TECHNIQUE: Contiguous axial images were obtained from the base of the skull through the vertex without intravenous contrast. RADIATION DOSE REDUCTION: This exam was performed according to the departmental dose-optimization program which includes automated exposure control, adjustment of the mA and/or kV  according to patient size and/or use of iterative reconstruction technique. COMPARISON:  None Available. FINDINGS: Brain: Ill-defined hypodensity in the left superior cerebellum (series 6, image 55), which could indicate an acute or subacute infarct. Encephalomalacia in the right frontal lobe is favored to represent sequela of a more remote infarct (series 4, image 15). No evidence of acute hemorrhage, mass, mass effect, or midline shift. No hydrocephalus or extra-axial collection. Vascular: No hyperdense vessel. Atherosclerotic calcifications in the intracranial carotid and vertebral arteries. Skull: Negative for fracture or focal lesion. Sinuses/Orbits: Mucosal thickening in the maxillary sinuses, frontal sinuses, and ethmoid air cells. Status post bilateral lens replacements. Other: The mastoid air cells are well aerated. ASPECTS ([place] Stroke Program Early CT Score) - Ganglionic level infarction (caudate, lentiform nuclei, internal capsule, insula, M1-M3 cortex): 7 - Supraganglionic infarction (M4-M6 cortex): 3 Total score (0-10 with 10 being normal): 10 IMPRESSION: 1. Ill-defined hypodensity in the left superior cerebellum, which could indicate  acute to subacute infarct. 2. Encephalomalacia in the right frontal lobe favored to represent sequela of a more remote infarct. 3. No hemorrhage. Imaging results were communicated on 01/27/2023 at 5:47 pm to provider Dr. Rory Percy via secure text paging. Electronically Signed   By: Merilyn Baba M.D.   On: 01/27/2023 17:48    Labs:  Basic Metabolic Panel: Recent Labs  Lab 01/27/23 1731 01/27/23 1738 01/28/23 0618 01/29/23 0345 01/30/23 0747 02/02/23 0530  NA 135 137 135 137 139 136  K 3.6 3.6 3.8 3.8 4.2 4.1  CL 106 105 102 107 106 102  CO2 16*  --  21* 22 25 25   GLUCOSE 126* 123* 88 99 98 94  BUN 19 20 13 12 12 19   CREATININE 1.15 1.20 1.14 1.17 1.22 1.29*  CALCIUM 8.4*  --  8.2* 8.5* 8.7* 8.8*  MG  --   --   --  2.1 2.0  --     CBC: Recent Labs   Lab 01/27/23 1731 01/27/23 1738 01/29/23 0345 01/30/23 0747 02/02/23 0530  WBC 9.1   < > 7.3 8.6 9.2  NEUTROABS 6.2  --   --   --  5.4  HGB 13.4   < > 12.4* 12.8* 13.6  HCT 38.9*   < > 37.1* 36.9* 39.6  MCV 105.1*   < > 103.9* 102.5* 104.2*  PLT 252   < > 238 240 219   < > = values in this interval not displayed.    CBG: Recent Labs  Lab 01/27/23 1730  GLUCAP 124*    Brief HPI:   Juan [name] is a 89 y.o. male    Hospital Course: Juan [name] was admitted to rehab 02/01/2023 for inpatient therapies to consist of PT, ST and OT at least three hours five days a week. Past admission physiatrist, therapy team and rehab RN have worked together to provide customized collaborative inpatient rehab.   Blood pressures were monitored on TID basis and   Diabetes has been monitored with ac/hs CBG checks and SSI was use prn for tighter BS control.    During patient's stay in rehab brief team conference was held to discuss patient's progress, set goals and any barriers to discharge. At admission, patient required he   He exhibited moderate to sever aphasia with impaired ability to fllow simple one step commands  He  has had improvement in activity tolerance, balance, postural control as well as ability to compensate for deficits     Disposition: Home  Diet: Heart Healthy diet.   Special Instructions: No driving   Allergies as of 02/03/2023   No Known Allergies      Medication List     TAKE these medications    apixaban 5 MG Tabs tablet Commonly known as: ELIQUIS Take 1 tablet (5 mg total) by mouth 2 (two) times daily.   fluticasone 50 MCG/ACT nasal spray Commonly known as: FLONASE Place 2 sprays into both nostrils daily.   QUEtiapine 25 MG tablet Commonly known as: SEROQUEL Take 1 tablet (25 mg total) by mouth at bedtime.   rosuvastatin 20 MG tablet Commonly known as: CRESTOR Take 1 tablet (20 mg total) by mouth daily.        Follow-up  Information     Kirsteins, Luanna Salk, MD. Call.   Specialty: Physical Medicine and Rehabilitation Why: As needed Contact information: [place] [address] [phone]         [facility]  Follow up.   Why: office will call you with follow up appointment Contact information: [address]     [place] [phone]                Signed: Bary Leriche 02/03/2023, 4:30 PM

## 2023-02-03 NOTE — Progress Notes (Signed)
Inpatient Rehabilitation Care Coordinator Discharge Note   Patient Details  Name: Juan [name] MRN: [id number] Date of Birth: 05/17/1926   Discharge location: Home  Length of Stay: 3 Days  Discharge activity level: Supervision  Home/community participation: Children  Patient response [id number] Literacy - How often do you need to have someone help you when you read instructions, pamphlets, or other written material from your doctor or pharmacy?: Patient unable to respond  Patient response [id number] Isolation - How often do you feel lonely or isolated from those around you?: Patient unable to respond  Services provided included: MD, RD, PT, OT, SLP, RN, CM, TR, Pharmacy, SW  Financial Services:  Charity fundraiser Utilized: Private Insurance BCBS MEDICARE  Choices offered to/list presented to: Daughter, Orene Desanctis  Follow-up services arranged:  Outpatient, DME    Outpatient Servicies: Neuro Rehab PT OT SLP DME : None Reccomended    Patient response to transportation need: Is the patient able to respond to transportation needs?: Yes In the past 12 months, has lack of transportation kept you from medical appointments or from getting medications?: No In the past 12 months, has lack of transportation kept you from meetings, work, or from getting things needed for daily living?: No    Comments (or additional information):  Patient/Family verbalized understanding of follow-up arrangements:  Yes  Individual responsible for coordination of the follow-up plan: Orene Desanctis [phone]  Confirmed correct DME delivered: Dyanne Iha 02/03/2023    Dyanne Iha

## 2023-02-03 NOTE — Discharge Instructions (Addendum)
Inpatient Rehab Discharge Instructions  Juan [name] Discharge date and time: 02/04/23   Activities/Precautions/ Functional Status: Activity: no lifting, driving, or strenuous exercise for till cleared by MD Diet: cardiac diet Wound Care: none needed   Functional status:  ___ No restrictions     ___ Walk up steps independently _X__ 24/7 supervision/assistance   ___ Walk up steps with assistance ___ Intermittent supervision/assistance  ___ Bathe/dress independently ___ Walk with walker     _X__ Bathe/dress with assistance ___ Walk Independently    ___ Shower independently ___ Walk with assistance    ___ Shower with assistance _X__ No alcohol     ___ Return to work/school ________  Special Instructions:  COMMUNITY REFERRALS UPON DISCHARGE:     Outpatient: PT     OT    ST               Agency: [facility] Phone: [phone]              Appointment Date/Time: Please allow 3-5 days for scheduling to reach out.       STROKE/TIA DISCHARGE INSTRUCTIONS SMOKING Cigarette smoking nearly doubles your risk of having a stroke & is the single most alterable risk factor  If you smoke or have smoked in the last 12 months, you are advised to quit smoking for your health. Most of the excess cardiovascular risk related to smoking disappears within a year of stopping. Ask you doctor about anti-smoking medications New  Quit Line: 1-800-QUIT NOW Free Smoking Cessation Classes [phone]  CHOLESTEROL Know your levels; limit fat & cholesterol in your diet  Lipid Panel     Component Value Date/Time   CHOL 156 01/28/2023 0618   TRIG 64 01/28/2023 0618   HDL 53 01/28/2023 0618   CHOLHDL 2.9 01/28/2023 0618   VLDL 13 01/28/2023 0618   LDLCALC 90 01/28/2023 0618     Many patients benefit from treatment even if their cholesterol is at goal. Goal: Total Cholesterol (CHOL) less than 160 Goal:  Triglycerides (TRIG) less than 150 Goal:  HDL greater than  40 Goal:  LDL (LDLCALC) less than 100   BLOOD PRESSURE American Stroke Association blood pressure target is less that 120/80 mm/Hg  Your discharge blood pressure is:  BP: 128/77 Monitor your blood pressure Limit your salt and alcohol intake Many individuals will require more than one medication for high blood pressure  DIABETES (A1c is a blood sugar average for last 3 months) Goal HGBA1c is under 7% (HBGA1c is blood sugar average for last 3 months)  Diabetes: No known diagnosis of diabetes    Lab Results  Component Value Date   HGBA1C 5.9 (H) 01/28/2023    Your HGBA1c can be lowered with medications, healthy diet, and exercise. Check your blood sugar as directed by your physician Call your physician if you experience unexplained or low blood sugars.  PHYSICAL ACTIVITY/REHABILITATION Goal is 30 minutes at least 4 days per week  Activity: No driving, Therapies: see above Return to work: N/A Activity decreases your risk of heart attack and stroke and makes your heart stronger.  It helps control your weight and blood pressure; helps you relax and can improve your mood. Participate in a regular exercise program. Talk with your doctor about the best form of exercise for you (dancing, walking, swimming, cycling).  DIET/WEIGHT Goal is to maintain a healthy weight  Your discharge diet is:  Diet Order  Diet regular Fluid consistency: Thin  Diet effective now                   liquids Your height is:  Height: 5\' 10"  (177.8 cm) Your current weight is: Weight: 94.5 kg Your Body Mass Index (BMI) is:  BMI (Calculated): 29.89 Following the type of diet specifically designed for you will help prevent another stroke. Your goal weight is  Your  goal Body Mass Index (BMI) is 19-24. Healthy food habits can help reduce 3 risk factors for stroke:  High cholesterol, hypertension, and excess weight.  RESOURCES Stroke/Support Group:  Call [phone]   STROKE EDUCATION  PROVIDED/REVIEWED AND GIVEN TO PATIENT Stroke warning signs and symptoms How to activate emergency medical system (call 911). Medications prescribed at discharge. Need for follow-up after discharge. Personal risk factors for stroke. Pneumonia vaccine given:  Flu vaccine given:  My questions have been answered, the writing is legible, and I understand these instructions.  I will adhere to these goals & educational materials that have been provided to me after my discharge from the hospital.    My questions have been answered and I understand these instructions. I will adhere to these goals and the provided educational materials after my discharge from the hospital.  Patient/Caregiver Signature _______________________________ Date __________  Clinician Signature _______________________________________ Date __________  Please bring this form and your medication list with you to all your follow-up doctor's appointments.

## 2023-02-03 NOTE — Progress Notes (Signed)
Physical Therapy Session Note  Patient Details  Name: Juan [name] MRN: [id number] Date of Birth: Jan 14, 1926  Today's Date: 02/03/2023 PT Individual Time: 0800-0842 PT Individual Time Calculation (min): 42 min   Short Term Goals: Week 1:  PT Short Term Goal 1 (Week 1): = to LTGs based on ELOS  Skilled Therapeutic Interventions/Progress Updates:    Pt received sitting in recliner and agreeable to PT services. Pt transferred sit<>stand independently and performed ~100 ft of gait to main therapy gym w/ supervision A w/o AD and engaged in tasks focused of dynamic balance and functional balance strategies.  Pt instructed to step over 4 6" hurdles arranged in a series of ~71ft. Each lap came with a different task that the pt had to perform while remaining in gait. Performed w/ CGA. Required consistent visual and directional cues to remain on task. Lap 1: Tasked with just stepping over hurdles.  Lap 2: Pt provided different tasks to perform, such as touching his knee, touching his nose, Pt's receptive aphasia was a limiting factor. Visual cueing helped for pt to attend to task. Pt's balance was unaffected.  Lap 3: Pt instructed to pass ball to the side to therapist. Mod verbal and visual cueing necessary for pt to perform task.  Lap 4/5: Pt instructed to kick over colored cones that were set -up on the side. Pt had a minor posterior LO, however, demonstrated functional balance strategies to prevent LOB. Therapist there providing CGA to ensure stability.  Lap 6: Pt instructed to tap the top of cone. Preformed cone taps in sitting so that the instruction would carryover when performing it standing. Pt's global aphasia made performing task difficult. Instead of tapping the cone pt slowly knocked the cone over. Nevertheless, pt demonstrated functional motor control and balance by slowly tapping to cone over.   Pt ambulated back to room and was left sitting in recliner w/ all needs met.  Therapy  Documentation Precautions:  Precautions Precautions: Other (comment) Precaution Comments: global aphasia Restrictions Weight Bearing Restrictions: No General:   Pain: difficult to assess due to global aphasia          Therapy/Group: Individual Therapy  Sumedha Munnerlyn 02/03/2023, 8:43 AM

## 2023-02-03 NOTE — Plan of Care (Cosign Needed)
  Problem: RH Balance Goal: LTG Patient will maintain dynamic standing balance (PT) Description: LTG:  Patient will maintain dynamic standing balance with assistance during mobility activities (PT) Outcome: Completed/Met Flowsheets (Taken 02/03/2023 1720) LTG: Pt will maintain dynamic standing balance during mobility activities with:: Independent   Problem: RH Car Transfers Goal: LTG Patient will perform car transfers with assist (PT) Description: LTG: Patient will perform car transfers with assistance (PT). Outcome: Completed/Met Flowsheets (Taken 02/03/2023 1720) LTG: Pt will perform car transfers with assist:: Independent   Problem: RH Ambulation Goal: LTG Patient will ambulate in controlled environment (PT) Description: LTG: Patient will ambulate in a controlled environment, # of feet with assistance (PT). Outcome: Completed/Met Flowsheets (Taken 02/03/2023 1720) LTG: Pt will ambulate in controlled environ  assist needed:: Independent Goal: LTG Patient will ambulate in home environment (PT) Description: LTG: Patient will ambulate in home environment, # of feet with assistance (PT). Outcome: Completed/Met Flowsheets (Taken 02/03/2023 1720) LTG: Pt will ambulate in home environ  assist needed:: Independent   Problem: RH Stairs Goal: LTG Patient will ambulate up and down stairs w/assist (PT) Description: LTG: Patient will ambulate up and down # of stairs with assistance (PT) Outcome: Completed/Met Flowsheets (Taken 02/03/2023 1720) LTG: Pt will ambulate up/down stairs assist needed:: Independent with assistive device

## 2023-02-03 NOTE — TOC Progression Note (Signed)
Discharge medications (3) are being stored in the main pharmacy on the ground floor until patient is ready for discharge.   

## 2023-02-03 NOTE — Progress Notes (Signed)
Patient ID: Juan [name], male   DOB: 07-30-1926, 89 y.o.   MRN: [id number]  OP referral faxed to [facility]

## 2023-02-03 NOTE — Progress Notes (Signed)
Inpatient Rehabilitation Discharge Medication Review by a Pharmacist  A complete drug regimen review was completed for this patient to identify any potential clinically significant medication issues.   High Risk Drug Classes Is patient taking? Indication by Medication  Antipsychotic Yes Seroquel - agitation Compazine prn N/V  Anticoagulant Yes Eliquis - CVA, PAF  Antibiotic No    Opioid No    Antiplatelet No    Hypoglycemics/insulin No    Vasoactive Medication No    Chemotherapy No    Other Yes Robaxin - prn spasms Trazodone - prn sleep Rosuvastatin - HLD        Type of Medication Issue Identified Description of Issue Recommendation(s)  Drug Interaction(s) (clinically significant)        Duplicate Therapy        Allergy        No Medication Administration End Date        Incorrect Dose        Additional Drug Therapy Needed        Significant med changes from prior encounter (inform family/care partners about these prior to discharge).      Other            Clinically significant medication issues were identified that warrant physician communication and completion of prescribed/recommended actions by midnight of the next day:  No   Pharmacist comments: Eliquis education complete / co-pay check complete   Time spent performing this drug regimen review (minutes):  20 minutes   Thank you Anette Guarneri, PharmD

## 2023-02-03 NOTE — Progress Notes (Signed)
Physical Therapy Discharge Summary  Patient Details  Name: Juan [name] MRN: [id number] Date of Birth: 1926-10-01  Date of Discharge from PT service:February 03, 2023  Today's Date: 02/03/2023 PT Individual Time: 1032-1059 PT Individual Time Calculation (min): 27 min    Patient has met 5 of 5 long term goals due to improved balance, improved postural control, and improved coordination.  Patient to discharge at an ambulatory level Independent.   Patient's care partner is independent to provide the necessary physical assistance at discharge. Family education not necessary due to pt's high level of function.   Reasons goals not met: N/A  Recommendation:  Patient will benefit from ongoing skilled PT services in outpatient setting to continue to advance safe functional mobility, address ongoing impairments in dynamic balance, delayed responses due to aphasia, functional mobility, balance strategies and minimize fall risk.  Equipment: No equipment provided  Reasons for discharge: treatment goals met and discharge from hospital  Patient/family agrees with progress made and goals achieved: Yes  Skilled Therapeutic Intervention Pt received asleep in recliner chair. Awoke upon therapist's entrance. Pt donned knee brace independently in sitting. Pt's grad day assessment performed as noted below. Pt performed ~356ft of gait throughout the session. Pt relied on visual cues to know where to walk and where to turn. Pt Mod I for car transfer using car features to assist. Pt ambulated to ADL apartment and engaged in furniture transfers. Took moderate verbal cueing for pt to attend to the task of laying down in the bed, however demonstrated functional bed mobility. Pt performed ball tossing and ball taps in standing w/ supervision to work on dynamic balance, functional use of the LUE and motor initiation.   Pt ambulated back to room w/ supervision and returned to sitting in recliner chair w/ all needs  met.   PT Discharge Precautions/Restrictions Precautions Precaution Comments: global aphasia Restrictions Weight Bearing Restrictions: No Pain Pain Assessment Pain Scale: Faces Faces Pain Scale: No hurt Pain Interference Pain Interference Pain Effect on Sleep: 8. Unable to answer Pain Interference with Therapy Activities: 8. Unable to answer Pain Interference with Day-to-Day Activities: 8. Unable to answer Vision/Perception  Vision - History Ability to See in Adequate Light: 0 Adequate Vision - Assessment Eye Alignment: Within Functional Limits Ocular Range of Motion: Within Functional Limits Alignment/Gaze Preference: Within Defined Limits Tracking/Visual Pursuits: Able to track stimulus in all quads without difficulty Saccades: Within functional limits Perception Perception: Within Functional Limits Praxis Praxis: Intact  Cognition Overall Cognitive Status: Difficult to assess (pt very aphasic) Arousal/Alertness: Awake/alert Awareness: Impaired Safety/Judgment: Impaired Sensation Sensation Light Touch: Appears Intact (responded to stimulus bilaterally but unable to formally assess due to aphasia) Hot/Cold: Not tested Proprioception: Appears Intact Stereognosis: Not tested Coordination Gross Motor Movements are Fluid and Coordinated: Yes Motor  Motor Motor: Within Functional Limits  Mobility Bed Mobility Bed Mobility: Rolling Right;Sitting - Scoot to Edge of Bed;Sit to Supine Rolling Right: Independent Supine to Sit: Independent Sitting - Scoot to Edge of Bed: Independent Sit to Supine: Independent Transfers Transfers: Sit to Stand;Stand to Sit Sit to Stand: Independent Stand to Sit: Independent Locomotion  Gait Ambulation: Yes Gait Assistance: Independent Gait Distance (Feet): 250 Feet Assistive device: None Gait Assistance Details: Verbal cues for precautions/safety Gait Assistance Details: Visual cues for direction Gait Gait: Yes Gait Pattern:  Within Functional Limits Gait velocity: WFL for household and limited community ambulation Stairs / Additional Locomotion Stairs: Yes Stairs Assistance: Independent with assistive device Stair Management Technique: Two rails;Step to pattern;Forwards  Number of Stairs: 12 Height of Stairs: 6 Ramp: Independent with assistive device Curb: Independent with assistive device Pick up small object from the floor assist level: Independent with assistive device Wheelchair Mobility Wheelchair Mobility: No  Trunk/Postural Assessment  Cervical Assessment Cervical Assessment: Within Functional Limits Thoracic Assessment Thoracic Assessment: Within Functional Limits Lumbar Assessment Lumbar Assessment: Within Functional Limits Postural Control Postural Control: Within Functional Limits  Balance Balance Balance Assessed: Yes Static Sitting Balance Static Sitting - Balance Support: Feet supported Static Sitting - Level of Assistance: 7: Independent Dynamic Sitting Balance Dynamic Sitting - Balance Support: Feet supported Dynamic Sitting - Level of Assistance: 7: Independent Static Standing Balance Static Standing - Balance Support: During functional activity Static Standing - Level of Assistance: 7: Independent Dynamic Standing Balance Dynamic Standing - Balance Support: During functional activity Dynamic Standing - Level of Assistance: 6: Modified independent (Device/Increase time) Extremity Assessment  RUE Assessment RUE Assessment: Within Functional Limits General Strength Comments: 5/5 LUE Assessment LUE Assessment: Within Functional Limits General Strength Comments: 5/5 RLE Assessment RLE Assessment: Within Functional Limits Active Range of Motion (AROM) Comments: WFL General Strength Comments: assessed functionally as pt with difficulty understanding visual demonstration for formal testing in sitting LLE Assessment LLE Assessment: Within Functional Limits Active Range of Motion  (AROM) Comments: WFL General Strength Comments: assessed functionally as pt with difficulty understanding visual demonstration for formal testing in sitting; however, did note slower movement in ankle DF/PF compared to R LE   Aniston Christman 02/03/2023, 12:17 PM

## 2023-02-03 NOTE — Progress Notes (Signed)
Occupational Therapy Discharge Summary  Patient Details  Name: Juan [name] MRN: [id number] Date of Birth: 1925-12-26  Date of Discharge from [facility] service:February 03, 2023   Patient has met 5 of 5 long term goals due to improved balance and improved awareness.  Patient to discharge at overall Independent level with basic self care but will need Supervision with IADLS and for [id number] due to global aphasia and difficulty with communication.   Patient's care partner is independent to provide the necessary cognitive assistance at discharge.    Reasons goals not met: n/a  Recommendation:  No further OT services needed at this time.   Equipment: No equipment provided  Reasons for discharge: treatment goals met  Patient/family agrees with progress made and goals achieved: Yes  OT Discharge Precautions/Restrictions  Precautions Precaution Comments: global aphasia Restrictions Weight Bearing Restrictions: No  ADL ADL Eating: Independent Grooming: Independent Where Assessed-Grooming: Standing at sink Upper Body Bathing: Independent Where Assessed-Upper Body Bathing: Shower Lower Body Bathing: Independent Where Assessed-Lower Body Bathing: Shower Upper Body Dressing: Independent Lower Body Dressing: Independent Toileting: Independent Where Assessed-Toileting: Glass blower/designer: Programmer, applications Method: Magazine features editor: IT consultant Method: Ambulating Vision Baseline Vision/History: 0 No visual deficits Patient Visual Report: No change from baseline Vision Assessment?: No apparent visual deficits;Yes Eye Alignment: Within Functional Limits Ocular Range of Motion: Within Functional Limits Alignment/Gaze Preference: Within Defined Limits Tracking/Visual Pursuits: Able to track stimulus in all quads without difficulty Saccades: Within functional limits Visual Fields: No apparent deficits Perception  Perception: Within  Functional Limits Praxis Praxis: Intact Cognition Cognition Overall Cognitive Status: Difficult to assess Arousal/Alertness: Awake/alert Orientation Level: Person Awareness: Impaired Awareness Impairment: Emergent impairment Safety/Judgment: Impaired Comments: cognition difficult to assess secondary to patient's global aphasia. Brief Interview for Mental Status (BIMS) Repetition of Three Words (First Attempt): 3 (copied written words) Temporal Orientation: Year: Correct Temporal Orientation: Month: Accurate within 5 days Temporal Orientation: Day: Correct Recall: "Sock": Nonsensical Recall: "Blue": Nonsensical Recall: "Bed": Nonsensical BIMS Summary Score: 9 Sensation Sensation Light Touch: Appears Intact Proprioception: Appears Intact Stereognosis: Not tested Motor  Motor Motor: Within Functional Limits Mobility    independent Trunk/Postural Assessment    WFL Balance   Standing balance WFL during basic self care and home management tasks Extremity/Trunk Assessment RUE Assessment RUE Assessment: Within Functional Limits General Strength Comments: 5/5 LUE Assessment LUE Assessment: Within Functional Limits General Strength Comments: 5/5   [name] 02/03/2023, 4:48 PM

## 2023-02-03 NOTE — Progress Notes (Signed)
Speech Language Pathology Discharge Summary  Patient Details  Name: Juan [name] MRN: [id number] Date of Birth: [birth year]  Date of Discharge from [facility] service:February 03, 2023  Today's Date: 02/03/2023 SLP Individual Time: 1103-1205 SLP Individual Time Calculation (min): 62 min   Skilled Therapeutic Interventions: Skilled treatment session focused on communication goals. SLP facilitated session by providing yes/no questions regarding basic biographical information in which the patient answered with 80% accuracy.  Patient also named functional items with 100% accuracy with anywhere from Min-Max written cues. Patient completed basic reading comprehension tasks at the word and phrase level with Min verbal cues to self-monitor and correct errors while reading aloud. Patient completed a sentence completion task with 100% accuracy when provided written choices from a field of 2. Patient's daughter present at end of session and educated regarding patient's current communication deficits and strategies to utilize to maximize auditory comprehension and verbal expression. She verbalized understanding and handouts were given to reinforce information. Patient left in recliner with family present.   Patient has met 3 of 3 long term goals.  Patient to discharge at overall Max level.   Reasons goals not met: N/A   Clinical Impression/Discharge Summary: Patient has had an extremely short-length of stay and continues to require overall Max A multimodal cues for functional communication. Overall, patient requires Max verbal cues for auditory of comprehension of basic information and following basic commands. Patient can spontaneously express his wants/needs at the phrase level but requires overall Max multimodal cues for verbal expression during structured language tasks. Patient does best with written cues as his overall reading comprehension appears intact at the word and phrase level. Patient and family  education is complete and patient will discharge home with 24 hour supervision from family. Patient would benefit from f/u SLP services to maximize his functional communication in order to reduce caregiver burden.   Care Partner:  Caregiver Able to Provide Assistance: Yes  Type of Caregiver Assistance: Cognitive  Recommendation:  Outpatient SLP;24 hour supervision/assistance  Rationale for SLP Follow Up: Maximize functional communication   Equipment: N/A   Reasons for discharge: Discharged from hospital   Patient/Family Agrees with Progress Made and Goals Achieved: Yes    [place], [place] 02/03/2023, 3:16 PM

## 2023-02-04 DIAGNOSIS — N1831 Chronic kidney disease, stage 3a: Secondary | ICD-10-CM | POA: Diagnosis not present

## 2023-02-04 DIAGNOSIS — I480 Paroxysmal atrial fibrillation: Secondary | ICD-10-CM | POA: Diagnosis not present

## 2023-02-04 DIAGNOSIS — E785 Hyperlipidemia, unspecified: Secondary | ICD-10-CM | POA: Diagnosis not present

## 2023-02-04 NOTE — Progress Notes (Signed)
PROGRESS NOTE   Subjective/Complaints: No new complaints this morning IV is still in place Has not yet received discharge packet Family is at bedside  ROS- denies pain   No results found. Recent Labs    02/02/23 0530  WBC 9.2  HGB 13.6  HCT 39.6  PLT 219   Recent Labs    02/02/23 0530  NA 136  K 4.1  CL 102  CO2 25  GLUCOSE 94  BUN 19  CREATININE 1.29*  CALCIUM 8.8*    Intake/Output Summary (Last 24 hours) at 02/04/2023 0956 Last data filed at 02/04/2023 0814 Gross per 24 hour  Intake 777 ml  Output 0 ml  Net 777 ml        Physical Exam: Vital Signs Blood pressure (!) 150/90, pulse 76, temperature 97.6 F (36.4 C), temperature source Oral, resp. rate 18, height 5\' 10"  (1.778 m), weight 94.5 kg, SpO2 96 %.  Gen: no distress, normal appearing HEENT: oral mucosa pink and moist, NCAT Cardio: Reg rate Chest: normal effort, normal rate of breathing Abd: soft, non-distended Ext: no edema Psych: pleasant, normal affect Skin: No evidence of breakdown, no evidence of rash Neurologic: Cranial nerves II through XII intact, motor strength is 4/5 in bilateral deltoid, bicep, tricep, grip, hip flexor, knee extensors, ankle dorsiflexor and plantar flexor Sensory exam limited by aphasia  Cerebellar exam normal finger to nose to finger as well as heel to shin in bilateral upper and lower extremities Musculoskeletal: Full range of motion in all 4 extremities. No joint swelling   Assessment/Plan: 1. Functional deficits which require 3+ hours per day of interdisciplinary therapy in a comprehensive inpatient rehab setting. Physiatrist is providing close team supervision and 24 hour management of active medical problems listed below. Physiatrist and rehab team continue to assess barriers to discharge/monitor patient progress toward functional and medical goals  Care Tool:  Bathing    Body parts bathed by patient:  Right arm, Left arm, Chest, Abdomen, Front perineal area, Buttocks, Right upper leg, Left upper leg, Face, Left lower leg, Right lower leg         Bathing assist Assist Level: Independent     Upper Body Dressing/Undressing Upper body dressing   What is the patient wearing?: Pull over shirt    Upper body assist Assist Level: Independent    Lower Body Dressing/Undressing Lower body dressing      What is the patient wearing?: Underwear/pull up, Pants     Lower body assist Assist for lower body dressing: Independent     Toileting Toileting    Toileting assist Assist for toileting: Independent     Transfers Chair/bed transfer  Transfers assist     Chair/bed transfer assist level: Independent     Locomotion Ambulation   Ambulation assist      Assist level: Independent Assistive device: No Device Max distance: 245ft   Walk 10 feet activity   Assist     Assist level: Independent Assistive device: No Device   Walk 50 feet activity   Assist    Assist level: Independent Assistive device: No Device    Walk 150 feet activity   Assist    Assist level:  Independent Assistive device: No Device    Walk 10 feet on uneven surface  activity   Assist     Assist level: Independent with assistive device Assistive device: Other (comment) (handrails)   Wheelchair     Assist Is the patient using a wheelchair?: No             Wheelchair 50 feet with 2 turns activity    Assist            Wheelchair 150 feet activity     Assist          Blood pressure (!) 150/90, pulse 76, temperature 97.6 F (36.4 C), temperature source Oral, resp. rate 18, height 5\' 10"  (1.778 m), weight 94.5 kg, SpO2 96 %.  Medical Problem List and Plan: 1. 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  Antithrombotics: -DVT/anticoagulation:  Pharmaceutical: Eliquis             -antiplatelet therapy: N/A 3. Pain Management:  Tylenol prn.  4. Mood/Behavior/Sleep: LCSW to follow for evaluation and support when appropraite             --Continue Seroquel--may need to increase to 50 mg as off haldol now. Nsg has not documented agitation at night              -antipsychotic agents: N/A 5. Neuropsych/cognition: This patient is not capable of making decisions on his own behalf. 6. Skin/Wound Care: Routine pressure relief measures.  7. Fluids/Electrolytes/Nutrition: Monitor I/O. Check CMET in am 8. PAF: Monitor HR TID and for symptoms with increase in activity. -continue Eliquis BID. May need BB if tachycardia and elevated BP persist , check ortho vitals  9. Delirium. Has been getting Haldol 2 mg IV at bedtime since admission for agitation             --sleep wake chart.              --family to spend nights to transition patient to CIR.  10. HTN: Long-term goal normotensive.  Monitor with activity Vitals:   02/03/23 1956 02/04/23 0604  BP: 128/77 (!) 150/90  Pulse: (!) 109 76  Resp: 18 18  Temp: 97.9 F (36.6 C) 97.6 F (36.4 C)  SpO2: 98% 96%    11.  Hyperlipidemia: Continue Crestor 20 mg 12.  CKD IIIa. Monitor outpatient   >30 minutes spent in discharge of patient including review of medications and follow-up appointments, physical examination, and in answering all patient's questions     LOS: 3 days A FACE TO FACE EVALUATION WAS [place] 02/04/2023, 9:56 AM

## 2023-02-15 NOTE — Therapy (Signed)
OUTPATIENT OCCUPATIONAL THERAPY NEURO EVALUATION  Patient Name: Juan [name] MRN: [id number] DOB:February 06, 1926, 89 y.o., male Today's Date: 02/20/2023  PCP: Gretta Cooln/a REFERRING PROVIDER:  Charlton AmorAngiulli, Daniel J, PA-C    END OF SESSION:  OT End of Session - 02/20/23 [id number]     Visit Number 1    Number of Visits 1    Authorization Type BCBS    OT Start Time 0933    OT Stop Time 1004    OT Time Calculation (min) 31 min    Activity Tolerance Patient tolerated treatment well;No increased pain    [facility] tasks assessed/performed             Past Medical History:  Diagnosis Date   Arrhythmia    Diverticulitis 2004   Hyperlipidemia    Prostate cancer    Shingles outbreak    LEFT CHEST WALL   Past Surgical History:  Procedure Laterality Date   APPENDECTOMY     Patient Active Problem List   Diagnosis Date Noted   CVA (cerebral vascular accident) 02/01/2023   Acute CVA (cerebrovascular accident) 01/28/2023   Acute encephalopathy 01/27/2023   Acidosis 01/27/2023   Stage 3a chronic kidney disease (CKD) 01/27/2023   PAF (paroxysmal atrial fibrillation)    Hyperlipidemia     ONSET DATE: CVA 01/27/23  REFERRING DIAG: I63.9 (ICD-10-CM) - Cerebral infarction, unspecified   THERAPY DIAG:  Cerebrovascular accident (CVA) due to other mechanism  Rationale for Evaluation and Treatment: Rehabilitation  SUBJECTIVE:   SUBJECTIVE STATEMENT: He states no having any significant issues. He is apparently hard of hearing. His daughter states that her brother lives with him at baseline and they assist with IADLs at baseline. She hasn't noticed any overt deficits, but they have not been "allowing him" to cook and such since his hospitalization.    Pt accompanied by:  daughterMaurice March- [name]   PERTINENT HISTORY: Per hospital OT notes: "Juan [name] is a 89 y.o. male who presents with garbled speech, confusion, and agitation. Work up revealed acute left MCA territory  infarct involving the left temporal lobe/temporoccipital region and left superior cerebellar artery infarct. PMHx: hyperlipidemia, prostate cancer, and paroxysmal atrial fibrillation not anticoagulated "  He was D/C from [facility] at independent level for BADLs, but 24/7 supervision for IADLs due to communication (aphasia) and cognition issues.   PRECAUTIONS: None  WEIGHT BEARING RESTRICTIONS: No  PAIN:  Are you having pain? No  FALLS: Has patient fallen in last 6 months? No  LIVING ENVIRONMENT: Lives with: lives with their family Has following equipment at home: None  PLOF: Independent, Independent with basic ADLs, Independent with household mobility without device, Independent with community mobility without device, Independent with gait, Independent with transfers, Needs assistance with homemaking, and gets assist with finances, driving, shopping, other IADLs   PATIENT GOALS: n/a  OBJECTIVE: (All objective assessments below are from initial evaluation on: 02/20/23 unless otherwise specified.)   HAND DOMINANCE: Right   ADLs: Overall BADLs: has been independent for these after D/C  IADLs: requires assist at baseline for most, but did cook his own meals. He has not attempted since hospital stay.   MOBILITY STATUS: Independent  POSTURE COMMENTS:  No Significant postural limitations  ACTIVITY TOLERANCE: Activity tolerance: WNL  UPPER EXTREMITY ROM    Eval: excellent for his age, status- no issues    UPPER EXTREMITY MMT:    Eval: excellent for his age, status- no issues   HAND FUNCTION: Eval: excellent for his age, status-  no issues  Grip strength Right: 66 lbs, Left: 62 lbs  Lt Key Pinch: 15# (arthritic thumb), Rt: 22#  COORDINATION: Eval: excellent for his age, status- no issues  9 Hole Peg Test Right: 28sec, Left: 26 sec   SENSATION: Eval: excellent for his age, status- no issues   EDEMA:   Eval: None  MUSCLE TONE:  Eval: excellent for his age, status- no issues    VISION: WNL  PERCEPTION: WFL  PRAXIS: WFL  OBSERVATIONS on function, cognition:   Eval: Although he seemed to have receptive and expressive cognition issues (defer to SLP), he read directions fairly well and followed them fairly well. Rote/procedural tasks seem largely unaffected by CVA and he has largely retained an awareness of safety hazards in his immediate physical environment.   TODAY'S TREATMENT:  Post-evaluation treatment:  For general safety edcuation, OT discussed monitoring his IADLs and doing home safety assessment (removing fall risks, structuring his environment) performing tasks analysis of any problems that occur. I gave her my card so she could contact me should any concerns arise.  (No significant tx today (<8 mins)    PATIENT EDUCATION: Education details: See tx section above for details  Person educated: Caregiver Education method: Engineer, structural, Teach back  Education comprehension: States and demonstrates understanding  HOME EXERCISE PROGRAM: N/A   GOALS: N/A no OT indicated    ASSESSMENT:  CLINICAL IMPRESSION: Patient is a 89 y.o. male who was seen today for occupational therapy evaluation for post CVA effects, but no significant OT deficits were found. No OT indicated at this point.    PERFORMANCE DEFICITS: in functional skills including IADLs, cognitive skills including  defer to SLP , and psychosocial skills including coping strategies.   IMPAIRMENTS: are limiting patient from IADLs.   CO-MORBIDITIES: may have co-morbidities  that affects occupational performance. Patient will benefit from skilled OT to address above impairments and improve overall function.  MODIFICATION OR ASSISTANCE TO COMPLETE EVALUATION: No modification of tasks or assist necessary to complete an evaluation.  OT OCCUPATIONAL PROFILE AND HISTORY: Detailed assessment: Review of records and additional review of physical, cognitive, psychosocial history related to current  functional performance.  CLINICAL DECISION MAKING: LOW - limited treatment options, no task modification necessary  REHAB POTENTIAL: not an OT candidate now   EVALUATION COMPLEXITY: Low    PLAN:  He will continue to f/u with SLP for treatment. No OT indicated at this time.    Fannie Knee, OT 02/20/2023, 1:09 PM

## 2023-02-20 ENCOUNTER — Other Ambulatory Visit: Payer: Self-pay

## 2023-02-20 ENCOUNTER — Ambulatory Visit: Payer: Medicare Other | Admitting: Rehabilitative and Restorative Service Providers"

## 2023-02-20 ENCOUNTER — Ambulatory Visit: Payer: Medicare Other | Attending: Physician Assistant

## 2023-02-20 ENCOUNTER — Ambulatory Visit: Payer: Medicare Other | Admitting: Physical Therapy

## 2023-02-20 ENCOUNTER — Encounter: Payer: Self-pay | Admitting: Rehabilitative and Restorative Service Providers"

## 2023-02-20 ENCOUNTER — Other Ambulatory Visit: Payer: Self-pay | Admitting: Physical Medicine and Rehabilitation

## 2023-02-20 DIAGNOSIS — I6389 Other cerebral infarction: Secondary | ICD-10-CM | POA: Insufficient documentation

## 2023-02-20 DIAGNOSIS — R2689 Other abnormalities of gait and mobility: Secondary | ICD-10-CM | POA: Diagnosis not present

## 2023-02-20 DIAGNOSIS — R4701 Aphasia: Secondary | ICD-10-CM | POA: Diagnosis not present

## 2023-02-20 NOTE — Therapy (Addendum)
OUTPATIENT SPEECH LANGUAGE PATHOLOGY APHASIA EVALUATION   Patient Name: Juan [name] MRN: [id number] DOB:05-30-1926, 89 y.o., male Today's Date: 02/20/2023  PCP: Gretta Cool REFERRING PROVIDER: Charlton Amor, PA-C   END OF SESSION:  End of Session - 02/20/23 0842     Visit Number 1    Number of Visits 17    Date for SLP Re-Evaluation 04/17/23    Authorization Type BCBS Medicare    SLP Start Time 0801    SLP Stop Time  0845    SLP Time Calculation (min) 44 min    Activity Tolerance Patient tolerated treatment well             Past Medical History:  Diagnosis Date   Arrhythmia    Diverticulitis 2004   Hyperlipidemia    Prostate cancer (HCC)    Shingles outbreak    LEFT CHEST WALL   Past Surgical History:  Procedure Laterality Date   APPENDECTOMY     Patient Active Problem List   Diagnosis Date Noted   CVA (cerebral vascular accident) 02/01/2023   Acute CVA (cerebrovascular accident) 01/28/2023   Acute encephalopathy 01/27/2023   Acidosis 01/27/2023   Stage 3a chronic kidney disease (CKD) 01/27/2023   PAF (paroxysmal atrial fibrillation)    Hyperlipidemia     ONSET DATE: 01-27-23   REFERRING DIAG: I63.9 (ICD-10-CM) - Cerebral infarction, unspecified   THERAPY DIAG:  Aphasia  Rationale for Evaluation and Treatment: Rehabilitation  SUBJECTIVE:   SUBJECTIVE STATEMENT: "I'm doing good" Pt accompanied by: family member (daughter)  PERTINENT HISTORY: Juan [name] is a 89 y.o. male with past medical history of hyperlipidemia, prostate cancer and paroxysmal atrial fibrillation not on anticoagulation presented to the hospital with garbled speech, confusion and agitation.  Patient was subsequently found to CVA and admitted to the hospitalist service.     PAIN:  Are you having pain? Yes: NPRS scale: 4/10 Pain location: knee Pain description: n/a Aggravating factors: n/a Relieving factors: n/a  FALLS: Has patient fallen in last 6 months?   No  LIVING ENVIRONMENT: Lives with: lives alone (reports family member is currently helping at home) Lives in: House/apartment  PLOF:  Level of assistance: Independent with ADLs, Independent with IADLs Employment: Retired  PATIENT GOALS: "Be able to carry on conversations" per daughter  OBJECTIVE:   MRI 01-28-23: DIAGNOSTIC FINDINGS: IMPRESSION: 1. Technically limited and truncated exam due to the patient's inability to tolerate the full length of the study. 2. Moderate-sized acute left MCA territory infarct involving the left temporal lobe/temporoccipital region. Suspected associated petechial blood products at the left temporal lobe. 3. Additional acute left superior cerebellar artery infarct, with a few additional punctate infarcts involving the right occipital lobe and right cerebellum. Given the various vascular distributions involved, a central thromboembolic etiology is suspected. 4. Chronic right  COGNITION: Overall cognitive status: Difficulty to assess due to: Communication impairment Functional deficits: Daughter had taken over to manage pt's iADLs  AUDITORY COMPREHENSION: Overall auditory comprehension: Impaired: simple YES/NO questions: Impaired: simple Following directions: Impaired: simple Conversation: Simple Interfering components: hearing (suspect impact of hearing loss) Effective technique: extra processing time, pausing, repetition/stressing words, slowed speech, written cues, stressing words, and visual/gestural cues Comment: Pt requesting frequent repetition from clinician to follow directions and participate in tasks. Per daughter's report, pt unaware of deficits and does not make attempts to repair conversation breakdowns unless requested.   READING COMPREHENSION: Impaired: Needs further assessment  EXPRESSION: verbal  VERBAL EXPRESSION: Level of generative/spontaneous verbalization: conversation Automatic speech: counting:  intact  Repetition:  Impaired: Word Naming: Confrontation: 0-25% Pragmatics: Appears intact Comments: Phonemic, neologistic, and semantic paraphasias. Pt's daughter reports she speaks him over the phone each day and "he can do short, very specific, but no general" conversations. Speech fluent, but empty with mild agrammatism.  Interfering components:  n/a Effective technique: written cues and repetition, visual cues (written, gestural), choices  Non-verbal means of communication: gestures   WRITTEN EXPRESSION: Dominant hand: right Written expression: Impaired: word Comments: Pt able to write name, difficulty writing his address and birth date. Spelling appears to be more intact. Difficult to assess d/t auditory comprehension. Benefited from visual aid.  MOTOR SPEECH: Overall motor speech: Appears intact  STANDARDIZED ASSESSMENTS: QAB: Severe and [place] NAMING TEST: Short Form - 5/15 with 2 paraphasias ("octopice" and "cano"); Pt attempted to describe objects with varying levels of success, such as "an exhaust woo-woo" for target "volcano". Given written choices, pt selected correct choice in 5/10 opportunities     PATIENT REPORTED OUTCOME MEASURES (PROM): Communication Participation Item Bank: Short Form- Daughter filled out-  - 13/30 Reported overall communication ability as "quite a bit" impacted and pt has difficulty participating in conversation, asking questions, giving detailed information, taking turns in fast-moving conversations    TODAY'S TREATMENT:                                                                                                                                         DATE:   02-20-23: Pr reports that he plays golf once a week on Thursdays. D/t length required to complete eval, education not initiated this date. Reading appears to be a general strength for the pt if he slows down and takes his time. Plan to assess reading comprehension further within first few sessions.   PATIENT  EDUCATION: Education details: See above Person educated: Patient and Child(ren) Education method: Explanation Education comprehension: verbalized understanding and needs further education   GOALS: Goals reviewed with patient? Yes  SHORT TERM GOALS: Target date: 03/20/2023    Pt will ID and attempt to self correct paraphasias  in 3/5 opportunities in a structured task across 2 sessions given mod-A.  Baseline: Goal status: INITIAL  2.  Pt will use multi-modal communication to aid verbal expression in 3/5 opportunities in a structured task across 2 sessions given mod-A.  Baseline:  Goal status: INITIAL  3.  Pt will appropriately request repetition for auditory comprehension breakdown for 60% of opportunities during a structured task given occasional mod-A.  Baseline:  Goal status: INITIAL  4.  Pt will successfully carry over trained techniques to effectively communicate in 2 5-minute conversations given mod-A.  Baseline:  Goal status: INITIAL   LONG TERM GOALS: Target date: 04/17/2023     Pt will ID and self correct paraphasias in 50% of opportunities during a 10 minute structured conversation given occasional mod-A.  Baseline:  Goal status: INITIAL  2.  Caregiver and/or pt will report perceived communicative improvement via PROM by d/c.  Baseline: 13/30 on communicative participation item bank- short form Goal status: INITIAL  3.   Pt will implement communication supports PRN during conversation on personally relevant topics given min-A. Goal status: INITIAL  4.  Pt will successfully implement auditory comprehension techniques to effectively communicate in 10-minute conversation given min-A.  Baseline:  Goal status: INITIAL   ASSESSMENT:  CLINICAL IMPRESSION: Patient is a 89 y.o. male who was seen today for aphasia evaluation s/p CVA. Pt presents with severe aphasia (per QAB), primarily characterized by phonemic and neologistic paraphasias, anomia, fluent but empty  speech, diminished auditory comprehension, and lack of awareness of deficits. Pt was previously independent with iADLs, however, daughter now manages pt's finances and medical-related tasks. Pt exhibits difficulty communicating effectively with familiar and unfamiliar communication partners. Benefits from repetition, slow speech rate and overenunciation, and visual aids to support auditory comprehension. Reading appears to be a strength, especially when pt slows down and takes his time reading. Pt would benefit from skilled ST to address aformentioned deficits and to improve pt's ability to effectively communicate at home and in the community.    OBJECTIVE IMPAIRMENTS: include expressive language, receptive language, and aphasia. These impairments are limiting patient from ADLs/IADLs and effectively communicating at home and in community. Factors affecting potential to achieve goals and functional outcome are  awareness of deficits . Patient will benefit from skilled SLP services to address above impairments and improve overall function.  REHAB POTENTIAL: Good  PLAN:  SLP FREQUENCY: 2x/week  SLP DURATION: 8 weeks  PLANNED INTERVENTIONS: Language facilitation, Cueing hierachy, Multimodal communication approach, SLP instruction and feedback, Compensatory strategies, and Patient/family education    Cephus Shelling, Student-SLP 02/20/2023, 12:17 PM

## 2023-02-20 NOTE — Therapy (Signed)
OUTPATIENT PHYSICAL THERAPY NEURO EVALUATION   Patient Name: Juan [name] MRN: [id number] DOB:1926-01-21, 89 y.o., male Today's Date: 02/20/2023   PCP: None on file REFERRING PROVIDER: Charlton Amor, PA-C  END OF SESSION:  PT End of Session - 02/20/23 0848     Visit Number 1    Number of Visits 1    Authorization Type BCBS Medicare    PT Start Time 0847    PT Stop Time 0912   Eval   PT Time Calculation (min) 25 min    Activity Tolerance Patient tolerated treatment well    Behavior During Therapy [facility] for tasks assessed/performed             Past Medical History:  Diagnosis Date   Arrhythmia    Diverticulitis 2004   Hyperlipidemia    Prostate cancer (HCC)    Shingles outbreak    LEFT CHEST WALL   Past Surgical History:  Procedure Laterality Date   APPENDECTOMY     Patient Active Problem List   Diagnosis Date Noted   CVA (cerebral vascular accident) 02/01/2023   Acute CVA (cerebrovascular accident) 01/28/2023   Acute encephalopathy 01/27/2023   Acidosis 01/27/2023   Stage 3a chronic kidney disease (CKD) 01/27/2023   PAF (paroxysmal atrial fibrillation)    Hyperlipidemia     ONSET DATE: 02/06/2023  REFERRING DIAG: I63.9 (ICD-10-CM) - Cerebral infarction, unspecified  THERAPY DIAG:  Other abnormalities of gait and mobility  Rationale for Evaluation and Treatment: Rehabilitation  SUBJECTIVE:                                                                                                                                                                                             SUBJECTIVE STATEMENT: Pt aphasic, daughter assisted with subjective. Pt reports he has no balance issues, is ambulating without an AD and has not had any falls. Has a history of L knee pain and is wearing knee brace, but denies pain at rest.   Pt accompanied by:  Daughter, Lane   PERTINENT HISTORY: 89 y.o. male who presents on 3/22 with garbled speech, confusion, and  agitation. Work up revealed acute left MCA territory infarct involving the left temporal lobe/temporoccipital region and left superior cerebellar artery infarct.Last seen normal on 03/18, he was combative, had metabolic acidosis and needed Versed. He was treated with IVF and CT head done showing diminished opacification anterior L-MCA branches. MRI brain done showing moderate acute L-MCA territory involving left temporal and temporo-occipital region with suspected petechial blood products in left temporal lobe and acute left cerebellar infarct with few punctate areas in right occipital and right  cerebellum felt to be central thromboembolic in origin. 2D echo showed EF 55-60% with aortic dilatation and mild to moderate aortic calcification. He was loaded with Keppra, started on ASA and EEG done which showed slowing in left temporal region was negative for seizures. He has had issues with agitation requiring Haldol and low dose seroquel added to help with sleep. His verbal output improving with increased ability to follow gestures.  PAIN:  Are you having pain? No Has R knee pain when weightbearing, has a custom knee brace on   PRECAUTIONS: Fall  WEIGHT BEARING RESTRICTIONS: No  FALLS: Has patient fallen in last 6 months? No  LIVING ENVIRONMENT: Lives with:  Alone, but son is staying with him as long as needed  Lives in: House/apartment Stairs: No Has following equipment at home: Grab bars  PLOF: Independent  PATIENT GOALS: "I don't know"   OBJECTIVE:   DIAGNOSTIC FINDINGS: MRI of brain on 01/28/23  IMPRESSION: 1. Technically limited and truncated exam due to the patient's inability to tolerate the full length of the study. 2. Moderate-sized acute left MCA territory infarct involving the left temporal lobe/temporoccipital region. Suspected associated petechial blood products at the left temporal lobe. 3. Additional acute left superior cerebellar artery infarct, with a few additional  punctate infarcts involving the right occipital lobe and right cerebellum. Given the various vascular distributions involved, a central thromboembolic etiology is suspected. 4. Chronic right frontal lobe infarct, right MCA distribution.  COGNITION: Overall cognitive status: Difficulty to assess due to: Communication impairment   SENSATION: Denies numbness/tingling     POSTURE: rounded shoulders, forward head, and flexed trunk    LOWER EXTREMITY MMT:  Tested in seated position   MMT Right Eval Left Eval  Hip flexion 4+ 4+  Hip extension    Hip abduction 4 4  Hip adduction 4 4  Hip internal rotation    Hip external rotation    Knee flexion 4+ +4  Knee extension 4+ 4+  Ankle dorsiflexion 4+ 4+  Ankle plantarflexion    Ankle inversion    Ankle eversion    (Blank rows = not tested)  BED MOBILITY:  Independent per pt   TRANSFERS: Assistive device utilized: None  Sit to stand: Modified independence Stand to sit: Modified independence No UE support required     STAIRS: Level of Assistance: Modified independence Stair Negotiation Technique: Alternating Pattern  with Bilateral Rails Number of Stairs: 4  Height of Stairs: 6"  Comments: No instability noted, good technique and speed   GAIT: Gait pattern: step through pattern and trunk flexed Distance walked: various clinic distances Assistive device utilized: None Level of assistance: Modified independence Comments: No instability noted. Pt does require visual cues for navigation around clinic due to aphasia   FUNCTIONAL TESTS:   [facility] PT Assessment - 02/20/23 0859       Transfers   Five time sit to stand comments  13.41s without UE support      Ambulation/Gait   Gait velocity 25' over 8.47s = 3.0 ft/s without AD              PATIENT SURVEYS:  FOTO 57  PATIENT  EDUCATION: Education details: Eval findings, importance of establishing PCP, how to obtain new referral if mobility needs change, performing walking program daily (30 min/day)  Person educated: Patient and Child(ren) Education method: Explanation, Demonstration, and Handouts Education comprehension: verbalized understanding  GOALS: N/A    ASSESSMENT:  CLINICAL IMPRESSION: Patient is a 89 year old male referred to Neuro OPPT for cerebral infarction. Pt's PMH is significant for: paroxysmal atrial fibrillation, hyperlipidemia, and prostate cancer. The following deficits were present during the exam: delayed processing and aphasia. At this time, pt does not require PT services as he is independent with mobility, has no history of falls and is back to physical baseline. Educated pt and daughter on how to obtain new PT referral if mobility needs change, pt and daughter verbalized understanding.    CLINICAL DECISION MAKING: Stable/uncomplicated  EVALUATION COMPLEXITY: Low  PLAN:  PT FREQUENCY: one time visit   Jackalyn Haith E Parker Sawatzky, PT, DPT 02/20/2023, 9:13 AM

## 2023-02-21 ENCOUNTER — Other Ambulatory Visit (HOSPITAL_COMMUNITY): Payer: Self-pay

## 2023-02-21 MED ORDER — QUETIAPINE FUMARATE 25 MG PO TABS
25.0000 mg | ORAL_TABLET | Freq: Every day | ORAL | 0 refills | Status: DC
  Filled 2023-02-21 – 2023-02-28 (×2): qty 30, 30d supply, fill #0

## 2023-02-21 MED ORDER — APIXABAN 5 MG PO TABS
5.0000 mg | ORAL_TABLET | Freq: Two times a day (BID) | ORAL | 0 refills | Status: DC
  Filled 2023-02-21 – 2023-02-28 (×2): qty 60, 30d supply, fill #0

## 2023-02-21 MED ORDER — ROSUVASTATIN CALCIUM 20 MG PO TABS
20.0000 mg | ORAL_TABLET | Freq: Every day | ORAL | 0 refills | Status: DC
  Filled 2023-02-21 – 2023-02-28 (×2): qty 30, 30d supply, fill #0

## 2023-02-24 ENCOUNTER — Ambulatory Visit: Payer: Medicare Other

## 2023-02-24 DIAGNOSIS — R2689 Other abnormalities of gait and mobility: Secondary | ICD-10-CM | POA: Diagnosis not present

## 2023-02-24 DIAGNOSIS — R4701 Aphasia: Secondary | ICD-10-CM

## 2023-02-24 DIAGNOSIS — I6389 Other cerebral infarction: Secondary | ICD-10-CM | POA: Diagnosis not present

## 2023-02-24 NOTE — Patient Instructions (Signed)
To help his understand better, first say his name to get his attention, speak slowly, stress the key words, and use a visual (write word, show picture)  If you notice a word is said incorrectly, repeat the correct word and help him say it correctly (repeat it slowly, write it). Provide cues, not the word. See example below with the cuing hierarchy.  Example target: "mug" The thing you put coffee in Starts with m M - U - G Fill my coffee _____ "Muh" Mug  Homeowrk: Your family can help you write the words and/or help you correct how to say them. Aim to write 3-5 items per category.   Write down all family members names       Write down favorite foods       Write down your television shows       Write down the places you golfed

## 2023-02-24 NOTE — Therapy (Signed)
OUTPATIENT SPEECH LANGUAGE PATHOLOGY APHASIA TREATMENT   Patient Name: Juan [name] MRN: [id number] DOB:1926-05-04, 89 y.o., male Today's Date: 02/24/2023  PCP: Gretta Cool REFERRING PROVIDER: Charlton Amor, PA-C   END OF SESSION:  End of Session - 02/24/23 0754     Visit Number 2    Number of Visits 17    Date for SLP Re-Evaluation 04/17/23    Authorization Type BCBS Medicare    SLP Start Time 0759    SLP Stop Time  0843    SLP Time Calculation (min) 44 min    Activity Tolerance Patient tolerated treatment well              Past Medical History:  Diagnosis Date   Arrhythmia    Diverticulitis 2004   Hyperlipidemia    Prostate cancer    Shingles outbreak    LEFT CHEST WALL   Past Surgical History:  Procedure Laterality Date   APPENDECTOMY     Patient Active Problem List   Diagnosis Date Noted   CVA (cerebral vascular accident) 02/01/2023   Acute CVA (cerebrovascular accident) 01/28/2023   Acute encephalopathy 01/27/2023   Acidosis 01/27/2023   Stage 3a chronic kidney disease (CKD) 01/27/2023   PAF (paroxysmal atrial fibrillation)    Hyperlipidemia     ONSET DATE: 01-27-23   REFERRING DIAG: I63.9 (ICD-10-CM) - Cerebral infarction, unspecified   THERAPY DIAG:  Aphasia  Rationale for Evaluation and Treatment: Rehabilitation  SUBJECTIVE:   SUBJECTIVE STATEMENT: "fine" Pt accompanied by: family member   PERTINENT HISTORY: Juan [name] is a 89 y.o. male with past medical history of hyperlipidemia, prostate cancer and paroxysmal atrial fibrillation not on anticoagulation presented to the hospital with garbled speech, confusion and agitation.  Patient was subsequently found to CVA and admitted to the hospitalist service.   PAIN: Are you having pain? No  FALLS: Has patient fallen in last 6 months?  No  LIVING ENVIRONMENT: Lives with: lives alone (reports family member is currently helping at home) Lives in: House/apartment  PLOF:  Level of  assistance: Independent with ADLs, Independent with IADLs Employment: Retired  PATIENT GOALS: "Be able to carry on conversations" per daughter  OBJECTIVE:  TODAY'S TREATMENT:                                                                                                                                          02-24-23: Educated and instructed patient and son on clinical observations, auditory comprehension strategies, and speech error modifications. Benefited from stating patient name to aid attention, slowed speech, stressed key words, and repetition. Reduced awareness of speech errors exhibited, in which SLP educated and instructed how to ID and correct errors. Benefited from consistent fading to intermittent visual and verbal modeling for correcting errors at word level. Updated HEP with family member education and functional naming.   02-20-23: Pt reports that he plays golf once  a week on Thursdays. D/t length required to complete eval, education not initiated this date. Reading appears to be a general strength for the pt if he slows down and takes his time. Plan to assess reading comprehension further within first few sessions.   PATIENT EDUCATION: Education details: See above Person educated: Patient and Child(ren) Education method: Explanation Education comprehension: verbalized understanding and needs further education   GOALS: Goals reviewed with patient? Yes  SHORT TERM GOALS: Target date: 03/20/2023    Pt will ID and attempt to self correct paraphasias  in 3/5 opportunities in a structured task across 2 sessions given mod-A.  Baseline: Goal status: IN PROGRESS  2.  Pt will use multi-modal communication to aid verbal expression in 3/5 opportunities in a structured task across 2 sessions given mod-A.  Baseline:  Goal status: IN PROGRESS  3.  Pt will appropriately request repetition for auditory comprehension breakdown for 60% of opportunities during a structured task given  occasional mod-A.  Baseline:  Goal status: IN PROGRESS  4.  Pt will successfully carry over trained techniques to effectively communicate in 2 5-minute conversations given mod-A.  Baseline:  Goal status: IN PROGRESS   LONG TERM GOALS: Target date: 04/17/2023     Pt will ID and self correct paraphasias in 50% of opportunities during a 10 minute structured conversation given occasional mod-A.  Baseline:  Goal status: IN PROGRESS  2.  Caregiver and/or pt will report perceived communicative improvement via PROM by d/c.  Baseline: 13/30 on communicative participation item bank- short form Goal status: IN PROGRESS  3.   Pt will implement communication supports PRN during conversation on personally relevant topics given min-A. Goal status: IN PROGRESS  4.  Pt will successfully implement auditory comprehension techniques to effectively communicate in 10-minute conversation given min-A.  Baseline:  Goal status: IN PROGRESS   ASSESSMENT:  CLINICAL IMPRESSION: Patient is a 89 y.o. male who was seen today for aphasia s/p CVA. Pt presents with severe aphasia (per QAB), primarily characterized by phonemic and neologistic paraphasias, anomia, fluent but empty speech, diminished auditory comprehension, and lack of awareness of deficits. Pt was previously independent with iADLs, however, daughter now manages pt's finances and medical-related tasks. Pt exhibits difficulty communicating effectively with familiar and unfamiliar communication partners. Benefits from repetition, slow speech rate and overenunciation, and visual aids to support auditory comprehension. Reading appears to be a strength, especially when pt slows down and takes his time reading. Pt would benefit from skilled ST to address aformentioned deficits and to improve pt's ability to effectively communicate at home and in the community.    OBJECTIVE IMPAIRMENTS: include expressive language, receptive language, and aphasia. These  impairments are limiting patient from ADLs/IADLs and effectively communicating at home and in community. Factors affecting potential to achieve goals and functional outcome are  awareness of deficits . Patient will benefit from skilled SLP services to address above impairments and improve overall function.  REHAB POTENTIAL: Good  PLAN:  SLP FREQUENCY: 2x/week  SLP DURATION: 8 weeks  PLANNED INTERVENTIONS: Language facilitation, Cueing hierachy, Multimodal communication approach, SLP instruction and feedback, Compensatory strategies, and Patient/family education    Gracy Racer, CCC-SLP 02/24/2023, 11:55 AM

## 2023-02-26 NOTE — Therapy (Unsigned)
OUTPATIENT SPEECH LANGUAGE PATHOLOGY APHASIA TREATMENT   Patient Name: Juan [name] MRN: [id number] DOB:April 18, 1926, 89 y.o., male Today's Date: 02/27/2023  PCP: Gretta Cool REFERRING PROVIDER: Charlton Amor, PA-C   END OF SESSION:  End of Session - 02/27/23 0758     Visit Number 3    Number of Visits 17    Date for SLP Re-Evaluation 04/17/23    Authorization Type BCBS Medicare    SLP Start Time 0800    SLP Stop Time  0845    SLP Time Calculation (min) 45 min    Activity Tolerance Patient tolerated treatment well               Past Medical History:  Diagnosis Date   Arrhythmia    Diverticulitis 2004   Hyperlipidemia    Prostate cancer    Shingles outbreak    LEFT CHEST WALL   Past Surgical History:  Procedure Laterality Date   APPENDECTOMY     Patient Active Problem List   Diagnosis Date Noted   CVA (cerebral vascular accident) 02/01/2023   Acute CVA (cerebrovascular accident) 01/28/2023   Acute encephalopathy 01/27/2023   Acidosis 01/27/2023   Stage 3a chronic kidney disease (CKD) 01/27/2023   PAF (paroxysmal atrial fibrillation)    Hyperlipidemia     ONSET DATE: 01-27-23   REFERRING DIAG: I63.9 (ICD-10-CM) - Cerebral infarction, unspecified   THERAPY DIAG: Aphasia  Rationale for Evaluation and Treatment: Rehabilitation  SUBJECTIVE:   SUBJECTIVE STATEMENT: "I watched golf" Pt accompanied by: family member   PERTINENT HISTORY: Juan [name] is a 89 y.o. male with past medical history of hyperlipidemia, prostate cancer and paroxysmal atrial fibrillation not on anticoagulation presented to the hospital with garbled speech, confusion and agitation.  Patient was subsequently found to CVA and admitted to the hospitalist service.   PAIN: Are you having pain? No  FALLS: Has patient fallen in last 6 months?  No  LIVING ENVIRONMENT: Lives with: lives alone (reports family member is currently helping at home) Lives in: House/apartment  PLOF:   Level of assistance: Independent with ADLs, Independent with IADLs Employment: Retired  PATIENT GOALS: "Be able to carry on conversations" per daughter  OBJECTIVE:  TODAY'S TREATMENT:                                                                                                                                          02-27-23: Targeted verbal expression and reading comprehension. Functional naming of objects and pictures revealed usual anomia and neologisms. Usual max A required to ID and correct anomia/paraphasias. Given limited benefit of first letter and phonemic cues for anomia, targeted use of sentence starters to aid naming. Benefited from cued slowed rate and written cues. Intermittent mod A required to finish phrases due to phonemic paraphasias. Good reading comprehension exhibited given multiple choice of 2-3 items. Writing also benefited corrections in verbal expression.  02-24-23: Educated and instructed patient and son on clinical observations, auditory comprehension strategies, and speech error modifications. Benefited from stating patient name to aid attention, slowed speech, stressed key words, and repetition. Reduced awareness of speech errors exhibited, in which SLP educated and instructed how to ID and correct errors. Benefited from consistent fading to intermittent visual and verbal modeling for correcting errors at word level. Updated HEP with family member education and functional naming.   02-20-23: Pt reports that he plays golf once a week on Thursdays. D/t length required to complete eval, education not initiated this date. Reading appears to be a general strength for the pt if he slows down and takes his time. Plan to assess reading comprehension further within first few sessions.   PATIENT EDUCATION: Education details: See above Person educated: Patient and Child(ren) Education method: Explanation Education comprehension: verbalized understanding and needs further  education   GOALS: Goals reviewed with patient? Yes  SHORT TERM GOALS: Target date: 03/20/2023    Pt will ID and attempt to self correct paraphasias  in 3/5 opportunities in a structured task across 2 sessions given mod-A.  Baseline: Goal status: IN PROGRESS  2.  Pt will use multi-modal communication to aid verbal expression in 3/5 opportunities in a structured task across 2 sessions given mod-A.  Baseline:  Goal status: IN PROGRESS  3.  Pt will appropriately request repetition for auditory comprehension breakdown for 60% of opportunities during a structured task given occasional mod-A.  Baseline:  Goal status: IN PROGRESS  4.  Pt will successfully carry over trained techniques to effectively communicate in 2 5-minute conversations given mod-A.  Baseline:  Goal status: IN PROGRESS   LONG TERM GOALS: Target date: 04/17/2023     Pt will ID and self correct paraphasias in 50% of opportunities during a 10 minute structured conversation given occasional mod-A.  Baseline:  Goal status: IN PROGRESS  2.  Caregiver and/or pt will report perceived communicative improvement via PROM by d/c.  Baseline: 13/30 on communicative participation item bank- short form Goal status: IN PROGRESS  3.   Pt will implement communication supports PRN during conversation on personally relevant topics given min-A. Goal status: IN PROGRESS  4.  Pt will successfully implement auditory comprehension techniques to effectively communicate in 10-minute conversation given min-A.  Baseline:  Goal status: IN PROGRESS   ASSESSMENT:  CLINICAL IMPRESSION: Patient is a 89 y.o. male who was seen today for aphasia s/p CVA. Pt presents with severe aphasia (per QAB), primarily characterized by phonemic and neologistic paraphasias, anomia, fluent but empty speech, diminished auditory comprehension, and lack of awareness of deficits. Pt was previously independent with iADLs, however, daughter now manages pt's finances  and medical-related tasks. Pt exhibits difficulty communicating effectively with familiar and unfamiliar communication partners. Benefits from repetition, slow speech rate and overenunciation, and visual aids to support auditory comprehension. Reading appears to be a strength, especially when pt slows down and takes his time reading. Pt would benefit from skilled ST to address aformentioned deficits and to improve pt's ability to effectively communicate at home and in the community.    OBJECTIVE IMPAIRMENTS: include expressive language, receptive language, and aphasia. These impairments are limiting patient from ADLs/IADLs and effectively communicating at home and in community. Factors affecting potential to achieve goals and functional outcome are  awareness of deficits . Patient will benefit from skilled SLP services to address above impairments and improve overall function.  REHAB POTENTIAL: Good  PLAN:  SLP FREQUENCY: 2x/week  SLP  DURATION: 8 weeks  PLANNED INTERVENTIONS: Language facilitation, Cueing hierachy, Multimodal communication approach, SLP instruction and feedback, Compensatory strategies, and Patient/family education    Gracy Racer, CCC-SLP 02/27/2023, 7:59 AM

## 2023-02-27 ENCOUNTER — Ambulatory Visit: Payer: Medicare Other

## 2023-02-27 DIAGNOSIS — R2689 Other abnormalities of gait and mobility: Secondary | ICD-10-CM | POA: Diagnosis not present

## 2023-02-27 DIAGNOSIS — R4701 Aphasia: Secondary | ICD-10-CM

## 2023-02-27 DIAGNOSIS — I6389 Other cerebral infarction: Secondary | ICD-10-CM | POA: Diagnosis not present

## 2023-02-28 ENCOUNTER — Other Ambulatory Visit: Payer: Self-pay

## 2023-02-28 ENCOUNTER — Other Ambulatory Visit (HOSPITAL_COMMUNITY): Payer: Self-pay

## 2023-03-01 ENCOUNTER — Ambulatory Visit: Payer: Medicare Other

## 2023-03-01 ENCOUNTER — Other Ambulatory Visit (HOSPITAL_COMMUNITY): Payer: Self-pay

## 2023-03-01 DIAGNOSIS — R4701 Aphasia: Secondary | ICD-10-CM

## 2023-03-01 DIAGNOSIS — I6389 Other cerebral infarction: Secondary | ICD-10-CM | POA: Diagnosis not present

## 2023-03-01 DIAGNOSIS — R2689 Other abnormalities of gait and mobility: Secondary | ICD-10-CM | POA: Diagnosis not present

## 2023-03-01 NOTE — Therapy (Addendum)
OUTPATIENT SPEECH LANGUAGE PATHOLOGY APHASIA TREATMENT   Patient Name: Juan [name] MRN: [id number] DOB:1926/07/22, 89 y.o., male Today's Date: 03/01/2023  PCP: Gretta Cool REFERRING PROVIDER: Charlton Amor, PA-C   END OF SESSION:  End of Session - 03/01/23 0840     Visit Number 4    Number of Visits 17    Date for SLP Re-Evaluation 04/17/23    Authorization Type [employer] Medicare    SLP Start Time 0843    SLP Stop Time  0930    SLP Time Calculation (min) 47 min    Activity Tolerance Patient tolerated treatment well                Past Medical History:  Diagnosis Date   Arrhythmia    Diverticulitis 2004   Hyperlipidemia    Prostate cancer    Shingles outbreak    LEFT CHEST WALL   Past Surgical History:  Procedure Laterality Date   APPENDECTOMY     Patient Active Problem List   Diagnosis Date Noted   CVA (cerebral vascular accident) 02/01/2023   Acute CVA (cerebrovascular accident) 01/28/2023   Acute encephalopathy 01/27/2023   Acidosis 01/27/2023   Stage 3a chronic kidney disease (CKD) 01/27/2023   PAF (paroxysmal atrial fibrillation)    Hyperlipidemia     ONSET DATE: 01-27-23   REFERRING DIAG: I63.9 (ICD-10-CM) - Cerebral infarction, unspecified   THERAPY DIAG: Aphasia  Rationale for Evaluation and Treatment: Rehabilitation  SUBJECTIVE:   SUBJECTIVE STATEMENT: "nothing" Pt accompanied by: family member   PERTINENT HISTORY: Juan [name] is a 89 y.o. male with past medical history of hyperlipidemia, prostate cancer and paroxysmal atrial fibrillation not on anticoagulation presented to the hospital with garbled speech, confusion and agitation.  Patient was subsequently found to CVA and admitted to the hospitalist service.   PAIN: Are you having pain? No  FALLS: Has patient fallen in last 6 months?  No  LIVING ENVIRONMENT: Lives with: lives alone (reports family member is currently helping at home) Lives in: House/apartment  PLOF:  Level  of assistance: Independent with ADLs, Independent with IADLs Employment: Retired  PATIENT GOALS: "Be able to carry on conversations" per daughter  OBJECTIVE:  TODAY'S TREATMENT:                                                                                                                                          03-01-23: Reviewed HEP that was returned. Difficult to engage performance as nothing was written as pt completed independently per son. Usual mod to max A required to correct verbal paraphasias while reading sentence completions and occasional written cues required to correct dysnomia, which in part seemed related to reading comprehension. Assessed reading comprehension further via RCVA-2. Overall reading comprehension and naming grossly WNL (given words and pictures), with occasional mod cues required to aid naming at word and sentence levels and rare mod cues  required to aid comprehension of multi-sentence prompts. Greatly benefited from cued slowed rate to aid comprehension and verbal naming. No awareness of errors exhibited during oral reading, in which SLP trialed auditory recordings and visual cues with limited benefit. Given reading as relative strength, recommended family write down pertinent words for patient use at home when attempting to communicate.   02-27-23: Targeted verbal expression and reading comprehension. Functional naming of objects and pictures revealed usual anomia and neologisms. Usual max A required to ID and correct anomia/paraphasias. Given limited benefit of first letter and phonemic cues for anomia, targeted use of sentence starters to aid naming. Benefited from cued slowed rate and written cues. Intermittent mod A required to finish phrases due to phonemic paraphasias. Good reading comprehension exhibited given multiple choice of 2-3 items. Writing also benefited corrections in verbal expression.   02-24-23: Educated and instructed patient and son on clinical  observations, auditory comprehension strategies, and speech error modifications. Benefited from stating patient name to aid attention, slowed speech, stressed key words, and repetition. Reduced awareness of speech errors exhibited, in which SLP educated and instructed how to ID and correct errors. Benefited from consistent fading to intermittent visual and verbal modeling for correcting errors at word level. Updated HEP with family member education and functional naming.   02-20-23: Pt reports that he plays golf once a week on Thursdays. D/t length required to complete eval, education not initiated this date. Reading appears to be a general strength for the pt if he slows down and takes his time. Plan to assess reading comprehension further within first few sessions.   PATIENT EDUCATION: Education details: See above Person educated: Patient and Child(ren) Education method: Explanation Education comprehension: verbalized understanding and needs further education   GOALS: Goals reviewed with patient? Yes  SHORT TERM GOALS: Target date: 03/20/2023    Pt will ID and attempt to self correct paraphasias  in 3/5 opportunities in a structured task across 2 sessions given mod-A.  Baseline: Goal status: IN PROGRESS  2.  Pt will use multi-modal communication to aid verbal expression in 3/5 opportunities in a structured task across 2 sessions given mod-A.  Baseline:  Goal status: IN PROGRESS  3.  Pt will appropriately request repetition for auditory comprehension breakdown for 60% of opportunities during a structured task given occasional mod-A.  Baseline:  Goal status: IN PROGRESS  4.  Pt will successfully carry over trained techniques to effectively communicate in 2 5-minute conversations given mod-A.  Baseline:  Goal status: IN PROGRESS   LONG TERM GOALS: Target date: 04/17/2023     Pt will ID and self correct paraphasias in 50% of opportunities during a 10 minute structured conversation  given occasional mod-A.  Baseline:  Goal status: IN PROGRESS  2.  Caregiver and/or pt will report perceived communicative improvement via PROM by d/c.  Baseline: 13/30 on communicative participation item bank- short form Goal status: IN PROGRESS  3.   Pt will implement communication supports PRN during conversation on personally relevant topics given min-A. Goal status: IN PROGRESS  4.  Pt will successfully implement auditory comprehension techniques to effectively communicate in 10-minute conversation given min-A.  Baseline:  Goal status: IN PROGRESS   ASSESSMENT:  CLINICAL IMPRESSION: Patient is a 89 y.o. male who was seen today for aphasia s/p CVA. Pt presents with severe aphasia (per QAB), primarily characterized by phonemic and neologistic paraphasias, anomia, fluent but empty speech, diminished auditory comprehension, and lack of awareness of deficits. Pt was previously independent with  iADLs, however, daughter now manages pt's finances and medical-related tasks. Pt exhibits difficulty communicating effectively with familiar and unfamiliar communication partners. Benefits from repetition, slow speech rate and overenunciation, and visual aids to support auditory comprehension. Reading appears to be a strength, especially when pt slows down and takes his time reading. Pt would benefit from skilled ST to address aformentioned deficits and to improve pt's ability to effectively communicate at home and in the community.    OBJECTIVE IMPAIRMENTS: include expressive language, receptive language, and aphasia. These impairments are limiting patient from ADLs/IADLs and effectively communicating at home and in community. Factors affecting potential to achieve goals and functional outcome are  awareness of deficits . Patient will benefit from skilled SLP services to address above impairments and improve overall function.  REHAB POTENTIAL: Good  PLAN:  SLP FREQUENCY: 2x/week  SLP DURATION: 8  weeks  PLANNED INTERVENTIONS: Language facilitation, Cueing hierachy, Multimodal communication approach, SLP instruction and feedback, Compensatory strategies, and Patient/family education    Gracy Racer, CCC-SLP 03/01/2023, 8:43 AM

## 2023-03-01 NOTE — Patient Instructions (Signed)
Your strength is reading!  Practice reading aloud SLOWLY Family should write down information to help you understand.  Ex: writing directions or key words   Homework:  1) Make a list of words that Juan [name] asks/requests so he can practice saying the key words he wants to say 2) Read the questions aloud slowly and answer these questions

## 2023-03-03 ENCOUNTER — Ambulatory Visit: Payer: Medicare Other

## 2023-03-06 ENCOUNTER — Ambulatory Visit: Payer: Medicare Other

## 2023-03-06 DIAGNOSIS — R2689 Other abnormalities of gait and mobility: Secondary | ICD-10-CM | POA: Diagnosis not present

## 2023-03-06 DIAGNOSIS — R4701 Aphasia: Secondary | ICD-10-CM | POA: Diagnosis not present

## 2023-03-06 DIAGNOSIS — I6389 Other cerebral infarction: Secondary | ICD-10-CM | POA: Diagnosis not present

## 2023-03-06 NOTE — Patient Instructions (Signed)
At home, what are you having trouble saying?  Write down the words you need/want to be able to say so we can practice them.    Ex: remote, deodorant, sofa, light bulb, mayo   Family Recommendations: Use a white board or paper to write down key words or questions  Write out choices if he can't come up with an answer Play the naming game. Hold up any item in the house and see if you can name it aloud. If you say the wrong word or it comes out funny, slow down and try to say it again. You may need your family members help to get the word out right.

## 2023-03-06 NOTE — Therapy (Signed)
OUTPATIENT SPEECH LANGUAGE PATHOLOGY APHASIA TREATMENT   Patient Name: Juan [name] MRN: [id number] DOB:[birth year], 89 y.o., male Today's Date: 03/06/2023  PCP: Gretta Cool REFERRING PROVIDER: Charlton Amor, PA-C   END OF SESSION:  End of Session - 03/06/23 0753     Visit Number 5    Number of Visits 17    Date for SLP Re-Evaluation 04/17/23    Authorization Type [employer] Medicare    SLP Start Time 0845    SLP Stop Time  0930    SLP Time Calculation (min) 45 min    Activity Tolerance Patient tolerated treatment well                Past Medical History:  Diagnosis Date   Arrhythmia    Diverticulitis 2004   Hyperlipidemia    Prostate cancer (HCC)    Shingles outbreak    LEFT CHEST WALL   Past Surgical History:  Procedure Laterality Date   APPENDECTOMY     Patient Active Problem List   Diagnosis Date Noted   CVA (cerebral vascular accident) (HCC) 02/01/2023   Acute CVA (cerebrovascular accident) (HCC) 01/28/2023   Acute encephalopathy 01/27/2023   Acidosis 01/27/2023   Stage 3a chronic kidney disease (CKD) (HCC) 01/27/2023   PAF (paroxysmal atrial fibrillation) (HCC)    Hyperlipidemia     ONSET DATE: 01-27-23   REFERRING DIAG: I63.9 (ICD-10-CM) - Cerebral infarction, unspecified   THERAPY DIAG: Aphasia  Rationale for Evaluation and Treatment: Rehabilitation  SUBJECTIVE:   SUBJECTIVE STATEMENT: "doing well" re: weekend plans Pt accompanied by: self   PERTINENT HISTORY: Juan [name] is a 89 y.o. male with past medical history of hyperlipidemia, prostate cancer and paroxysmal atrial fibrillation not on anticoagulation presented to the hospital with garbled speech, confusion and agitation.  Patient was subsequently found to CVA and admitted to the hospitalist service.   PAIN: Are you having pain? No  FALLS: Has patient fallen in last 6 months?  No  LIVING ENVIRONMENT: Lives with: lives alone (reports family member is currently helping at  home) Lives in: House/apartment  PLOF:  Level of assistance: Independent with ADLs, Independent with IADLs Employment: Retired  PATIENT GOALS: "Be able to carry on conversations" per daughter  OBJECTIVE:  TODAY'S TREATMENT:                                                                                                                                          03-06-23: Continues to benefit from consistent written aids to optimize comprehension. For functional conversation questions with written prompt, occasional max A required to aid understanding and correct verbal paraphasias. Some rare increasing awareness of verbal errors noted today. Approximately 10-20% ability to self-correct errors when SLP assisted with identification. Cued writing was rarely effective for naming today. Recommended functional oral naming practice at home for increased ability to functional communicate wants/needs at home.   03-01-23:  Reviewed HEP that was returned. Difficult to engage performance as nothing was written as pt completed independently per son. Usual mod to max A required to correct verbal paraphasias while reading sentence completions and occasional written cues required to correct dysnomia, which in part seemed related to reading comprehension. Assessed reading comprehension further via RCVA-2. Overall reading comprehension and naming grossly WNL (given words and pictures), with occasional mod cues required to aid naming at word and sentence levels and rare mod cues required to aid comprehension of multi-sentence prompts. Greatly benefited from cued slowed rate to aid comprehension and verbal naming. No awareness of errors exhibited during oral reading, in which SLP trialed auditory recordings and visual cues with limited benefit. Given reading as relative strength, recommended family write down pertinent words for patient use at home when attempting to communicate.   02-27-23: Targeted verbal expression and  reading comprehension. Functional naming of objects and pictures revealed usual anomia and neologisms. Usual max A required to ID and correct anomia/paraphasias. Given limited benefit of first letter and phonemic cues for anomia, targeted use of sentence starters to aid naming. Benefited from cued slowed rate and written cues. Intermittent mod A required to finish phrases due to phonemic paraphasias. Good reading comprehension exhibited given multiple choice of 2-3 items. Writing also benefited corrections in verbal expression.   02-24-23: Educated and instructed patient and son on clinical observations, auditory comprehension strategies, and speech error modifications. Benefited from stating patient name to aid attention, slowed speech, stressed key words, and repetition. Reduced awareness of speech errors exhibited, in which SLP educated and instructed how to ID and correct errors. Benefited from consistent fading to intermittent visual and verbal modeling for correcting errors at word level. Updated HEP with family member education and functional naming.   02-20-23: Pt reports that he plays golf once a week on Thursdays. D/t length required to complete eval, education not initiated this date. Reading appears to be a general strength for the pt if he slows down and takes his time. Plan to assess reading comprehension further within first few sessions.   PATIENT EDUCATION: Education details: See above Person educated: Patient Education method: Explanation, Demonstration, Verbal cues, and Handouts Education comprehension: verbalized understanding, returned demonstration, verbal cues required, and needs further education   GOALS: Goals reviewed with patient? Yes  SHORT TERM GOALS: Target date: 03/20/2023    Pt will ID and attempt to self correct paraphasias  in 3/5 opportunities in a structured task across 2 sessions given mod-A.  Baseline: Goal status: IN PROGRESS  2.  Pt will use multi-modal  communication to aid verbal expression in 3/5 opportunities in a structured task across 2 sessions given mod-A.  Baseline:  Goal status: IN PROGRESS  3.  Pt will appropriately request repetition for auditory comprehension breakdown for 60% of opportunities during a structured task given occasional mod-A.  Baseline:  Goal status: IN PROGRESS  4.  Pt will successfully carry over trained techniques to effectively communicate in 2 5-minute conversations given mod-A.  Baseline:  Goal status: IN PROGRESS   LONG TERM GOALS: Target date: 04/17/2023     Pt will ID and self correct paraphasias in 50% of opportunities during a 10 minute structured conversation given occasional mod-A.  Baseline:  Goal status: IN PROGRESS  2.  Caregiver and/or pt will report perceived communicative improvement via PROM by d/c.  Baseline: 13/30 on communicative participation item bank- short form Goal status: IN PROGRESS  3.   Pt will implement communication supports  PRN during conversation on personally relevant topics given min-A. Goal status: IN PROGRESS  4.  Pt will successfully implement auditory comprehension techniques to effectively communicate in 10-minute conversation given min-A.  Baseline:  Goal status: IN PROGRESS   ASSESSMENT:  CLINICAL IMPRESSION: Patient is a 89 y.o. male who was seen today for aphasia s/p CVA. Pt presents with mild improvements in mod-severe aphasia (per QAB), primarily characterized by intermittent phonemic and neologistic paraphasias, frequent anomia, fluent but vague speech, diminished auditory comprehension, and reduced awareness of deficits. Continued education and instruction of expressive and receptive interventions and strategies to maximize functional communication. Pt would benefit from skilled ST to address aformentioned deficits and to improve pt's ability to effectively communicate at home and in the community.    OBJECTIVE IMPAIRMENTS: include expressive language,  receptive language, and aphasia. These impairments are limiting patient from ADLs/IADLs and effectively communicating at home and in community. Factors affecting potential to achieve goals and functional outcome are  awareness of deficits . Patient will benefit from skilled SLP services to address above impairments and improve overall function.  REHAB POTENTIAL: Good  PLAN:  SLP FREQUENCY: 2x/week  SLP DURATION: 8 weeks  PLANNED INTERVENTIONS: Language facilitation, Cueing hierachy, Multimodal communication approach, SLP instruction and feedback, Compensatory strategies, and Patient/family education    Gracy Racer, CCC-SLP 03/06/2023, 9:44 AM

## 2023-03-07 NOTE — Therapy (Unsigned)
OUTPATIENT SPEECH LANGUAGE PATHOLOGY APHASIA TREATMENT   Patient Name: Juan [name] MRN: [id number] DOB:Jan 30, 1926, 89 y.o., male Today's Date: 03/08/2023  PCP: Gretta Cool REFERRING PROVIDER: Charlton Amor, PA-C   END OF SESSION:  End of Session - 03/08/23 0752     Visit Number 6    Number of Visits 17    Date for SLP Re-Evaluation 04/17/23    Authorization Type [employer] Medicare    SLP Start Time 0800    SLP Stop Time  0843    SLP Time Calculation (min) 43 min    Activity Tolerance Patient tolerated treatment well                 Past Medical History:  Diagnosis Date   Arrhythmia    Diverticulitis 2004   Hyperlipidemia    Prostate cancer (HCC)    Shingles outbreak    LEFT CHEST WALL   Past Surgical History:  Procedure Laterality Date   APPENDECTOMY     Patient Active Problem List   Diagnosis Date Noted   CVA (cerebral vascular accident) (HCC) 02/01/2023   Acute CVA (cerebrovascular accident) (HCC) 01/28/2023   Acute encephalopathy 01/27/2023   Acidosis 01/27/2023   Stage 3a chronic kidney disease (CKD) (HCC) 01/27/2023   PAF (paroxysmal atrial fibrillation) (HCC)    Hyperlipidemia     ONSET DATE: 01-27-23   REFERRING DIAG: I63.9 (ICD-10-CM) - Cerebral infarction, unspecified   THERAPY DIAG: Aphasia  Rationale for Evaluation and Treatment: Rehabilitation  SUBJECTIVE:   SUBJECTIVE STATEMENT: "we worked some of them" re: HEP Pt accompanied by: self   PERTINENT HISTORY: Juan [name] is a 89 y.o. male with past medical history of hyperlipidemia, prostate cancer and paroxysmal atrial fibrillation not on anticoagulation presented to the hospital with garbled speech, confusion and agitation.  Patient was subsequently found to CVA and admitted to the hospitalist service.   PAIN: Are you having pain? No  FALLS: Has patient fallen in last 6 months?  No  LIVING ENVIRONMENT: Lives with: lives alone (reports family member is currently helping at  home) Lives in: House/apartment  PLOF:  Level of assistance: Independent with ADLs, Independent with IADLs Employment: Retired  PATIENT GOALS: "Be able to carry on conversations" per daughter  OBJECTIVE:  TODAY'S TREATMENT:                                                                                                                                          03-08-23: Entered with homework given instructions to ID functional words necessary to communicate at home. It appeared random words were copied that did not appear related to his daily life given pt unable to express personal connection despite written supports for comprehension. Targeted functional naming of every day pictures, in which pt able to name with 50% accuracy independently. Pt demonstrated increased awareness of errors but required usual mod to max A to  correct phonemic paraphasias. Provided examples of functional naming practice to be completed at home with assistance from family members.   03-06-23: Continues to benefit from consistent written aids to optimize comprehension. For functional conversation questions with written prompt, occasional max A required to aid understanding and correct verbal paraphasias. Some rare increasing awareness of verbal errors noted today. Approximately 10-20% ability to self-correct errors when SLP assisted with identification. Cued writing was rarely effective for naming today. Recommended functional oral naming practice at home for increased ability to functional communicate wants/needs at home.   03-01-23: Reviewed HEP that was returned. Difficult to engage performance as nothing was written as pt completed independently per son. Usual mod to max A required to correct verbal paraphasias while reading sentence completions and occasional written cues required to correct dysnomia, which in part seemed related to reading comprehension. Assessed reading comprehension further via RCVA-2. Overall reading  comprehension and naming grossly WNL (given words and pictures), with occasional mod cues required to aid naming at word and sentence levels and rare mod cues required to aid comprehension of multi-sentence prompts. Greatly benefited from cued slowed rate to aid comprehension and verbal naming. No awareness of errors exhibited during oral reading, in which SLP trialed auditory recordings and visual cues with limited benefit. Given reading as relative strength, recommended family write down pertinent words for patient use at home when attempting to communicate.   02-27-23: Targeted verbal expression and reading comprehension. Functional naming of objects and pictures revealed usual anomia and neologisms. Usual max A required to ID and correct anomia/paraphasias. Given limited benefit of first letter and phonemic cues for anomia, targeted use of sentence starters to aid naming. Benefited from cued slowed rate and written cues. Intermittent mod A required to finish phrases due to phonemic paraphasias. Good reading comprehension exhibited given multiple choice of 2-3 items. Writing also benefited corrections in verbal expression.   02-24-23: Educated and instructed patient and son on clinical observations, auditory comprehension strategies, and speech error modifications. Benefited from stating patient name to aid attention, slowed speech, stressed key words, and repetition. Reduced awareness of speech errors exhibited, in which SLP educated and instructed how to ID and correct errors. Benefited from consistent fading to intermittent visual and verbal modeling for correcting errors at word level. Updated HEP with family member education and functional naming.   02-20-23: Pt reports that he plays golf once a week on Thursdays. D/t length required to complete eval, education not initiated this date. Reading appears to be a general strength for the pt if he slows down and takes his time. Plan to assess reading  comprehension further within first few sessions.   PATIENT EDUCATION: Education details: See above Person educated: Patient Education method: Explanation, Demonstration, Verbal cues, and Handouts Education comprehension: verbalized understanding, returned demonstration, verbal cues required, and needs further education   GOALS: Goals reviewed with patient? Yes  SHORT TERM GOALS: Target date: 03/20/2023    Pt will ID and attempt to self correct paraphasias  in 3/5 opportunities in a structured task across 2 sessions given mod-A.  Baseline: Goal status: IN PROGRESS  2.  Pt will use multi-modal communication to aid verbal expression in 3/5 opportunities in a structured task across 2 sessions given mod-A.  Baseline: 03-08-23 Goal status: IN PROGRESS  3.  Pt will appropriately request repetition for auditory comprehension breakdown for 60% of opportunities during a structured task given occasional mod-A.  Baseline:  Goal status: IN PROGRESS  4.  Pt will successfully  carry over trained techniques to effectively communicate in 2 5-minute conversations given mod-A.  Baseline:  Goal status: IN PROGRESS   LONG TERM GOALS: Target date: 04/17/2023     Pt will ID and self correct paraphasias in 50% of opportunities during a 10 minute structured conversation given occasional mod-A.  Baseline:  Goal status: IN PROGRESS  2.  Caregiver and/or pt will report perceived communicative improvement via PROM by d/c.  Baseline: 13/30 on communicative participation item bank- short form Goal status: IN PROGRESS  3.   Pt will implement communication supports PRN during conversation on personally relevant topics given min-A. Goal status: IN PROGRESS  4.  Pt will successfully implement auditory comprehension techniques to effectively communicate in 10-minute conversation given min-A.  Baseline:  Goal status: IN PROGRESS   ASSESSMENT:  CLINICAL IMPRESSION: Patient is a 89 y.o. male who was seen  today for aphasia s/p CVA. Pt presents with mild improvements in mod-severe aphasia (per QAB), primarily characterized by intermittent phonemic and neologistic paraphasias, intermittent anomia, fluent but vague speech, diminished auditory comprehension, and reduced awareness of deficits. Continued education and instruction of expressive and receptive interventions and strategies to maximize functional communication. Pt would benefit from skilled ST to address aformentioned deficits and to improve pt's ability to effectively communicate at home and in the community.    OBJECTIVE IMPAIRMENTS: include expressive language, receptive language, and aphasia. These impairments are limiting patient from ADLs/IADLs and effectively communicating at home and in community. Factors affecting potential to achieve goals and functional outcome are  awareness of deficits . Patient will benefit from skilled SLP services to address above impairments and improve overall function.  REHAB POTENTIAL: Good  PLAN:  SLP FREQUENCY: 2x/week  SLP DURATION: 8 weeks  PLANNED INTERVENTIONS: Language facilitation, Cueing hierachy, Multimodal communication approach, SLP instruction and feedback, Compensatory strategies, and Patient/family education    Gracy Racer, CCC-SLP 03/08/2023, 11:12 AM

## 2023-03-08 ENCOUNTER — Ambulatory Visit: Payer: Medicare Other | Attending: Physician Assistant

## 2023-03-08 DIAGNOSIS — R4701 Aphasia: Secondary | ICD-10-CM | POA: Insufficient documentation

## 2023-03-08 NOTE — Patient Instructions (Signed)
It would be best to involve your family when you are practicing so they can help make any corrections. You are getting better about noticing your errors!  Ideas to practice functional naming:  Where you live  Places/cities your family lives Family members jobs Photographer places/shops Hobbies

## 2023-03-12 NOTE — Therapy (Unsigned)
OUTPATIENT SPEECH LANGUAGE PATHOLOGY APHASIA TREATMENT   Patient Name: Juan [name] MRN: [id number] DOB:Nov 24, 1925, 89 y.o., male Today's Date: 03/13/2023  PCP: Gretta Cool REFERRING PROVIDER: Charlton Amor, PA-C   END OF SESSION:  End of Session - 03/13/23 0757     Visit Number 7    Number of Visits 17    Date for SLP Re-Evaluation 04/17/23    Authorization Type [employer]BS Medicare    SLP Start Time 0800    SLP Stop Time  0843    SLP Time Calculation (min) 43 min    Activity Tolerance Patient tolerated treatment well                  Past Medical History:  Diagnosis Date   Arrhythmia    Diverticulitis 2004   Hyperlipidemia    Prostate cancer (HCC)    Shingles outbreak    LEFT CHEST WALL   Past Surgical History:  Procedure Laterality Date   APPENDECTOMY     Patient Active Problem List   Diagnosis Date Noted   CVA (cerebral vascular accident) (HCC) 02/01/2023   Acute CVA (cerebrovascular accident) (HCC) 01/28/2023   Acute encephalopathy 01/27/2023   Acidosis 01/27/2023   Stage 3a chronic kidney disease (CKD) (HCC) 01/27/2023   PAF (paroxysmal atrial fibrillation) (HCC)    Hyperlipidemia     ONSET DATE: 01-27-23   REFERRING DIAG: I63.9 (ICD-10-CM) - Cerebral infarction, unspecified   THERAPY DIAG: Aphasia  Rationale for Evaluation and Treatment: Rehabilitation  SUBJECTIVE:   SUBJECTIVE STATEMENT: "doing well" Pt accompanied by: self   PERTINENT HISTORY: Juan [name] is a 89 y.o. male with past medical history of hyperlipidemia, prostate cancer and paroxysmal atrial fibrillation not on anticoagulation presented to the hospital with garbled speech, confusion and agitation.  Patient was subsequently found to CVA and admitted to the hospitalist service.   PAIN: Are you having pain? No  FALLS: Has patient fallen in last 6 months?  No  LIVING ENVIRONMENT: Lives with: lives alone (reports family member is currently helping at home) Lives in:  House/apartment  PLOF:  Level of assistance: Independent with ADLs, Independent with IADLs Employment: Retired  PATIENT GOALS: "Be able to carry on conversations" per daughter  OBJECTIVE:  TODAY'S TREATMENT:                                                                                                                                          03-13-23: Targeted VNeST today to address expressive and receptive communication. Usual max A required to ID varied responses for "who" d/t reduced comprehension of targeted variations despite written cues and modeling. Occasional mod A required to ID "what" responses and answer WH-questions to expand responses. Occasional min A required to slow rate of reading to optimize speech accuracy during oral reading. Additional education and training required with VNeST next session to optimize pt understanding and performance. Provided  structured task for functional naming at home. Pt appropriately request clarification x1 when SLP provided instructions.   03-08-23: Entered with homework given instructions to ID functional words necessary to communicate at home. It appeared random words were copied that did not appear related to his daily life given pt unable to express personal connection despite written supports for comprehension. Targeted functional naming of every day pictures, in which pt able to name with 50% accuracy independently. Pt demonstrated increased awareness of errors but required usual mod to max A to correct phonemic paraphasias. Provided examples of functional naming practice to be completed at home with assistance from family members.   03-06-23: Continues to benefit from consistent written aids to optimize comprehension. For functional conversation questions with written prompt, occasional max A required to aid understanding and correct verbal paraphasias. Some rare increasing awareness of verbal errors noted today. Approximately 10-20% ability to  self-correct errors when SLP assisted with identification. Cued writing was rarely effective for naming today. Recommended functional oral naming practice at home for increased ability to functional communicate wants/needs at home.   03-01-23: Reviewed HEP that was returned. Difficult to engage performance as nothing was written as pt completed independently per son. Usual mod to max A required to correct verbal paraphasias while reading sentence completions and occasional written cues required to correct dysnomia, which in part seemed related to reading comprehension. Assessed reading comprehension further via RCVA-2. Overall reading comprehension and naming grossly WNL (given words and pictures), with occasional mod cues required to aid naming at word and sentence levels and rare mod cues required to aid comprehension of multi-sentence prompts. Greatly benefited from cued slowed rate to aid comprehension and verbal naming. No awareness of errors exhibited during oral reading, in which SLP trialed auditory recordings and visual cues with limited benefit. Given reading as relative strength, recommended family write down pertinent words for patient use at home when attempting to communicate.   02-27-23: Targeted verbal expression and reading comprehension. Functional naming of objects and pictures revealed usual anomia and neologisms. Usual max A required to ID and correct anomia/paraphasias. Given limited benefit of first letter and phonemic cues for anomia, targeted use of sentence starters to aid naming. Benefited from cued slowed rate and written cues. Intermittent mod A required to finish phrases due to phonemic paraphasias. Good reading comprehension exhibited given multiple choice of 2-3 items. Writing also benefited corrections in verbal expression.   02-24-23: Educated and instructed patient and son on clinical observations, auditory comprehension strategies, and speech error modifications. Benefited from  stating patient name to aid attention, slowed speech, stressed key words, and repetition. Reduced awareness of speech errors exhibited, in which SLP educated and instructed how to ID and correct errors. Benefited from consistent fading to intermittent visual and verbal modeling for correcting errors at word level. Updated HEP with family member education and functional naming.   02-20-23: Pt reports that he plays golf once a week on Thursdays. D/t length required to complete eval, education not initiated this date. Reading appears to be a general strength for the pt if he slows down and takes his time. Plan to assess reading comprehension further within first few sessions.   PATIENT EDUCATION: Education details: See above Person educated: Patient Education method: Explanation, Demonstration, Verbal cues, and Handouts Education comprehension: verbalized understanding, returned demonstration, verbal cues required, and needs further education   GOALS: Goals reviewed with patient? Yes  SHORT TERM GOALS: Target date: 03/20/2023    Pt will ID and  attempt to self correct paraphasias  in 3/5 opportunities in a structured task across 2 sessions given mod-A.  Baseline: Goal status: IN PROGRESS  2.  Pt will use multi-modal communication to aid verbal expression in 3/5 opportunities in a structured task across 2 sessions given mod-A.  Baseline: 03-08-23 Goal status: IN PROGRESS  3.  Pt will appropriately request repetition for auditory comprehension breakdown for 60% of opportunities during a structured task given occasional mod-A.  Baseline:  Goal status: IN PROGRESS  4.  Pt will successfully carry over trained techniques to effectively communicate in 2 5-minute conversations given mod-A.  Baseline:  Goal status: IN PROGRESS   LONG TERM GOALS: Target date: 04/17/2023     Pt will ID and self correct paraphasias in 50% of opportunities during a 10 minute structured conversation given occasional  mod-A.  Baseline:  Goal status: IN PROGRESS  2.  Caregiver and/or pt will report perceived communicative improvement via PROM by d/c.  Baseline: 13/30 on communicative participation item bank- short form Goal status: IN PROGRESS  3.   Pt will implement communication supports PRN during conversation on personally relevant topics given min-A. Goal status: IN PROGRESS  4.  Pt will successfully implement auditory comprehension techniques to effectively communicate in 10-minute conversation given min-A.  Baseline:  Goal status: IN PROGRESS   ASSESSMENT:  CLINICAL IMPRESSION: Patient is a 89 y.o. male who was seen today for aphasia s/p CVA. Pt presents with mild improvements in mod-severe aphasia (per QAB), primarily characterized by intermittent phonemic and neologistic paraphasias, intermittent anomia, fluent but vague speech, diminished auditory comprehension, and reduced awareness of deficits. Continued education and instruction of expressive and receptive interventions and strategies to maximize functional communication. Pt would benefit from skilled ST to address aformentioned deficits and to improve pt's ability to effectively communicate at home and in the community.    OBJECTIVE IMPAIRMENTS: include expressive language, receptive language, and aphasia. These impairments are limiting patient from ADLs/IADLs and effectively communicating at home and in community. Factors affecting potential to achieve goals and functional outcome are  awareness of deficits . Patient will benefit from skilled SLP services to address above impairments and improve overall function.  REHAB POTENTIAL: Good  PLAN:  SLP FREQUENCY: 2x/week  SLP DURATION: 8 weeks  PLANNED INTERVENTIONS: Language facilitation, Cueing hierachy, Multimodal communication approach, SLP instruction and feedback, Compensatory strategies, and Patient/family education    Gracy Racer, CCC-SLP 03/13/2023, 8:44 AM

## 2023-03-13 ENCOUNTER — Ambulatory Visit: Payer: Medicare Other

## 2023-03-13 DIAGNOSIS — R4701 Aphasia: Secondary | ICD-10-CM | POA: Diagnosis not present

## 2023-03-17 ENCOUNTER — Ambulatory Visit: Payer: Medicare Other

## 2023-03-17 DIAGNOSIS — R4701 Aphasia: Secondary | ICD-10-CM

## 2023-03-17 NOTE — Therapy (Signed)
OUTPATIENT SPEECH LANGUAGE PATHOLOGY APHASIA TREATMENT   Patient Name: Juan [name] MRN: [id number] DOB:01-16-1926, 89 y.o., male Today's Date: 03/17/2023  PCP: Gretta Cool REFERRING PROVIDER: Charlton Amor, PA-C   END OF SESSION:  End of Session - 03/17/23 0752     Visit Number 8    Number of Visits 17    Date for SLP Re-Evaluation 04/17/23    Authorization Type [employer] Medicare    SLP Start Time 0800    SLP Stop Time  0845    SLP Time Calculation (min) 45 min    Activity Tolerance Patient tolerated treatment well                   Past Medical History:  Diagnosis Date   Arrhythmia    Diverticulitis 2004   Hyperlipidemia    Prostate cancer (HCC)    Shingles outbreak    LEFT CHEST WALL   Past Surgical History:  Procedure Laterality Date   APPENDECTOMY     Patient Active Problem List   Diagnosis Date Noted   CVA (cerebral vascular accident) (HCC) 02/01/2023   Acute CVA (cerebrovascular accident) (HCC) 01/28/2023   Acute encephalopathy 01/27/2023   Acidosis 01/27/2023   Stage 3a chronic kidney disease (CKD) (HCC) 01/27/2023   PAF (paroxysmal atrial fibrillation) (HCC)    Hyperlipidemia     ONSET DATE: 01-27-23   REFERRING DIAG: I63.9 (ICD-10-CM) - Cerebral infarction, unspecified   THERAPY DIAG: Aphasia  Rationale for Evaluation and Treatment: Rehabilitation  SUBJECTIVE:   SUBJECTIVE STATEMENT: "nothing" Pt accompanied by: self   PERTINENT HISTORY: Juan [name] is a 89 y.o. male with past medical history of hyperlipidemia, prostate cancer and paroxysmal atrial fibrillation not on anticoagulation presented to the hospital with garbled speech, confusion and agitation.  Patient was subsequently found to CVA and admitted to the hospitalist service.   PAIN: Are you having pain? No  FALLS: Has patient fallen in last 6 months?  No  LIVING ENVIRONMENT: Lives with: lives alone (reports family member is currently helping at home) Lives in:  House/apartment  PLOF:  Level of assistance: Independent with ADLs, Independent with IADLs Employment: Retired  PATIENT GOALS: "Be able to carry on conversations" per daughter  OBJECTIVE:  TODAY'S TREATMENT:                                                                                                                                          03-17-23: Continued VNeST today, with frequent max A required to aid anomia and comprehension of WH-questions. Due to language impairment, pt perseverated on same responses due to overt difficulty with word retrieval. Sentence completions, first letters, and phonemic cues were ineffective to aid naming. Pt reliant on SLP writing targeted word for verbal naming and improved comprehension of objective. Continue to recommend family using written supports at home given adequate reading comprehension. Intermittent mod to max A  required to correct paraphasias at word level with limited awareness of errors today. Dicussed with son in lobby re: level of difficulty exhibited and need for 1:1 assistance at home to aid receptive and expressive language for targeted tasks. Son reported assisting with homework and providing recommended communication supports.   03-13-23: Targeted VNeST today to address expressive and receptive communication. Usual max A required to ID varied responses for "who" d/t reduced comprehension of targeted variations despite written cues and modeling. Occasional mod A required to ID "what" responses and answer WH-questions to expand responses. Occasional min A required to slow rate of reading to optimize speech accuracy during oral reading. Additional education and training required with VNeST next session to optimize pt understanding and performance. Provided structured task for functional naming at home. Pt appropriately request clarification x1 when SLP provided instructions.   03-08-23: Entered with homework given instructions to ID functional words  necessary to communicate at home. It appeared random words were copied that did not appear related to his daily life given pt unable to express personal connection despite written supports for comprehension. Targeted functional naming of every day pictures, in which pt able to name with 50% accuracy independently. Pt demonstrated increased awareness of errors but required usual mod to max A to correct phonemic paraphasias. Provided examples of functional naming practice to be completed at home with assistance from family members.   03-06-23: Continues to benefit from consistent written aids to optimize comprehension. For functional conversation questions with written prompt, occasional max A required to aid understanding and correct verbal paraphasias. Some rare increasing awareness of verbal errors noted today. Approximately 10-20% ability to self-correct errors when SLP assisted with identification. Cued writing was rarely effective for naming today. Recommended functional oral naming practice at home for increased ability to functional communicate wants/needs at home.   03-01-23: Reviewed HEP that was returned. Difficult to engage performance as nothing was written as pt completed independently per son. Usual mod to max A required to correct verbal paraphasias while reading sentence completions and occasional written cues required to correct dysnomia, which in part seemed related to reading comprehension. Assessed reading comprehension further via RCVA-2. Overall reading comprehension and naming grossly WNL (given words and pictures), with occasional mod cues required to aid naming at word and sentence levels and rare mod cues required to aid comprehension of multi-sentence prompts. Greatly benefited from cued slowed rate to aid comprehension and verbal naming. No awareness of errors exhibited during oral reading, in which SLP trialed auditory recordings and visual cues with limited benefit. Given reading as  relative strength, recommended family write down pertinent words for patient use at home when attempting to communicate.   02-27-23: Targeted verbal expression and reading comprehension. Functional naming of objects and pictures revealed usual anomia and neologisms. Usual max A required to ID and correct anomia/paraphasias. Given limited benefit of first letter and phonemic cues for anomia, targeted use of sentence starters to aid naming. Benefited from cued slowed rate and written cues. Intermittent mod A required to finish phrases due to phonemic paraphasias. Good reading comprehension exhibited given multiple choice of 2-3 items. Writing also benefited corrections in verbal expression.   02-24-23: Educated and instructed patient and son on clinical observations, auditory comprehension strategies, and speech error modifications. Benefited from stating patient name to aid attention, slowed speech, stressed key words, and repetition. Reduced awareness of speech errors exhibited, in which SLP educated and instructed how to ID and correct errors. Benefited from consistent fading  to intermittent visual and verbal modeling for correcting errors at word level. Updated HEP with family member education and functional naming.   02-20-23: Pt reports that he plays golf once a week on Thursdays. D/t length required to complete eval, education not initiated this date. Reading appears to be a general strength for the pt if he slows down and takes his time. Plan to assess reading comprehension further within first few sessions.   PATIENT EDUCATION: Education details: See above Person educated: Patient Education method: Explanation, Demonstration, Verbal cues, and Handouts Education comprehension: verbalized understanding, returned demonstration, verbal cues required, and needs further education   GOALS: Goals reviewed with patient? Yes  SHORT TERM GOALS: Target date: 03/20/2023    Pt will ID and attempt to self  correct paraphasias  in 3/5 opportunities in a structured task across 2 sessions given mod-A.  Baseline: Goal status: IN PROGRESS  2.  Pt will use multi-modal communication to aid verbal expression in 3/5 opportunities in a structured task across 2 sessions given mod-A.  Baseline: 03-08-23 Goal status: IN PROGRESS  3.  Pt will appropriately request repetition for auditory comprehension breakdown for 60% of opportunities during a structured task given occasional mod-A.  Baseline:  Goal status: IN PROGRESS  4.  Pt will successfully carry over trained techniques to effectively communicate in 2 5-minute conversations given mod-A.  Baseline:  Goal status: IN PROGRESS   LONG TERM GOALS: Target date: 04/17/2023     Pt will ID and self correct paraphasias in 50% of opportunities during a 10 minute structured conversation given occasional mod-A.  Baseline:  Goal status: IN PROGRESS  2.  Caregiver and/or pt will report perceived communicative improvement via PROM by d/c.  Baseline: 13/30 on communicative participation item bank- short form Goal status: IN PROGRESS  3.   Pt will implement communication supports PRN during conversation on personally relevant topics given min-A. Goal status: IN PROGRESS  4.  Pt will successfully implement auditory comprehension techniques to effectively communicate in 10-minute conversation given min-A.  Baseline:  Goal status: IN PROGRESS   ASSESSMENT:  CLINICAL IMPRESSION: Patient is a 89 y.o. male who was seen today for aphasia s/p CVA. Pt presents with mild improvements in mod-severe aphasia (per QAB) c/b intermittent phonemic and neologistic paraphasias, fluent but vague speech due to anomia, reduced auditory comprehension, and limited awareness of deficits. Continued education and instruction of expressive and receptive interventions and strategies to maximize functional communication. Due to ongoing severity of anomia, paraphasias, and neologisms with  reduced awareness, continue to recommend increased use of communication supports (written aids) and family assistance to optimize patient communication at home and performance on targeted home tasks. Pt would benefit from skilled ST to address aformentioned deficits and to improve pt's ability to effectively communicate at home and in the community.    OBJECTIVE IMPAIRMENTS: include expressive language, receptive language, and aphasia. These impairments are limiting patient from ADLs/IADLs and effectively communicating at home and in community. Factors affecting potential to achieve goals and functional outcome are  awareness of deficits . Patient will benefit from skilled SLP services to address above impairments and improve overall function.  REHAB POTENTIAL: Good  PLAN:  SLP FREQUENCY: 2x/week  SLP DURATION: 8 weeks  PLANNED INTERVENTIONS: Language facilitation, Cueing hierachy, Multimodal communication approach, SLP instruction and feedback, Compensatory strategies, and Patient/family education    Gracy Racer, CCC-SLP 03/17/2023, 7:52 AM

## 2023-03-17 NOTE — Patient Instructions (Signed)
Please use writing to help Abdallah understand at home. Write your questions and multiple choices if he doesn't understand the questions  Practice reading aloud at home. Read SLOWLY  I encourage family to help with homework to help the understanding and word finding.

## 2023-03-20 ENCOUNTER — Ambulatory Visit: Payer: Medicare Other

## 2023-03-20 DIAGNOSIS — R4701 Aphasia: Secondary | ICD-10-CM | POA: Diagnosis not present

## 2023-03-20 NOTE — Therapy (Signed)
OUTPATIENT SPEECH LANGUAGE PATHOLOGY APHASIA TREATMENT   Patient Name: Juan [name] MRN: [id number] DOB:[birth year], 89 y.o., male Today's Date: 03/20/2023  PCP: Juan [name] REFERRING PROVIDER: Charlton Amor, PA-C   END OF SESSION:  End of Session - 03/20/23 0755     Visit Number 9    Number of Visits 17    Date for SLP Re-Evaluation 04/17/23    Authorization Type [employer] Medicare    SLP Start Time 0759    SLP Stop Time  0843    SLP Time Calculation (min) 44 min    Activity Tolerance Patient tolerated treatment well                    Past Medical History:  Diagnosis Date   Arrhythmia    Diverticulitis 2004   Hyperlipidemia    Prostate cancer (HCC)    Shingles outbreak    LEFT CHEST WALL   Past Surgical History:  Procedure Laterality Date   APPENDECTOMY     Patient Active Problem List   Diagnosis Date Noted   CVA (cerebral vascular accident) (HCC) 02/01/2023   Acute CVA (cerebrovascular accident) (HCC) 01/28/2023   Acute encephalopathy 01/27/2023   Acidosis 01/27/2023   Stage 3a chronic kidney disease (CKD) (HCC) 01/27/2023   PAF (paroxysmal atrial fibrillation) (HCC)    Hyperlipidemia     ONSET DATE: 01-27-23   REFERRING DIAG: I63.9 (ICD-10-CM) - Cerebral infarction, unspecified   THERAPY DIAG: Aphasia  Rationale for Evaluation and Treatment: Rehabilitation  SUBJECTIVE:   SUBJECTIVE STATEMENT: "yes it was good" Pt accompanied by: self   PERTINENT HISTORY: Juan [name] is a 89 y.o. male with past medical history of hyperlipidemia, prostate cancer and paroxysmal atrial fibrillation not on anticoagulation presented to the hospital with garbled speech, confusion and agitation.  Patient was subsequently found to CVA and admitted to the hospitalist service.   PAIN: Are you having pain? No  FALLS: Has patient fallen in last 6 months?  No  LIVING ENVIRONMENT: Lives with: lives alone (reports family member is currently helping at home) Lives  in: House/apartment  PLOF:  Level of assistance: Independent with ADLs, Independent with IADLs Employment: Retired  PATIENT GOALS: "Be able to carry on conversations" per daughter  OBJECTIVE:  TODAY'S TREATMENT:                                                                                                                                          03-20-23: Returned homework with mostly accurate completion. Oral reading accuracy improved with intermittent cues to slow rate. Pt able to self-correct phonemic paraphasias with increased accuracy today given extra time and cues to slow rate. Good reading comprehension exhibited with rare cues required. Updated HEP to include oral reading of multi-syllabic words. Addressed STGs with some partially met goals due to modest progress thus far given severity of deficits.   03-17-23: Continued  VNeST today, with frequent max A required to aid anomia and comprehension of WH-questions. Due to language impairment, pt perseverated on same responses due to overt difficulty with word retrieval. Sentence completions, first letters, and phonemic cues were ineffective to aid naming. Pt reliant on SLP writing targeted word for verbal naming and improved comprehension of objective. Continue to recommend family using written supports at home given adequate reading comprehension. Intermittent mod to max A required to correct paraphasias at word level with limited awareness of errors today. Dicussed with son in lobby re: level of difficulty exhibited and need for 1:1 assistance at home to aid receptive and expressive language for targeted tasks. Son reported assisting with homework and providing recommended communication supports.   03-13-23: Targeted VNeST today to address expressive and receptive communication. Usual max A required to ID varied responses for "who" d/t reduced comprehension of targeted variations despite written cues and modeling. Occasional mod A required to ID  "what" responses and answer WH-questions to expand responses. Occasional min A required to slow rate of reading to optimize speech accuracy during oral reading. Additional education and training required with VNeST next session to optimize pt understanding and performance. Provided structured task for functional naming at home. Pt appropriately request clarification x1 when SLP provided instructions.   03-08-23: Entered with homework given instructions to ID functional words necessary to communicate at home. It appeared random words were copied that did not appear related to his daily life given pt unable to express personal connection despite written supports for comprehension. Targeted functional naming of every day pictures, in which pt able to name with 50% accuracy independently. Pt demonstrated increased awareness of errors but required usual mod to max A to correct phonemic paraphasias. Provided examples of functional naming practice to be completed at home with assistance from family members.   03-06-23: Continues to benefit from consistent written aids to optimize comprehension. For functional conversation questions with written prompt, occasional max A required to aid understanding and correct verbal paraphasias. Some rare increasing awareness of verbal errors noted today. Approximately 10-20% ability to self-correct errors when SLP assisted with identification. Cued writing was rarely effective for naming today. Recommended functional oral naming practice at home for increased ability to functional communicate wants/needs at home.   03-01-23: Reviewed HEP that was returned. Difficult to engage performance as nothing was written as pt completed independently per son. Usual mod to max A required to correct verbal paraphasias while reading sentence completions and occasional written cues required to correct dysnomia, which in part seemed related to reading comprehension. Assessed reading comprehension  further via RCVA-2. Overall reading comprehension and naming grossly WNL (given words and pictures), with occasional mod cues required to aid naming at word and sentence levels and rare mod cues required to aid comprehension of multi-sentence prompts. Greatly benefited from cued slowed rate to aid comprehension and verbal naming. No awareness of errors exhibited during oral reading, in which SLP trialed auditory recordings and visual cues with limited benefit. Given reading as relative strength, recommended family write down pertinent words for patient use at home when attempting to communicate.   02-27-23: Targeted verbal expression and reading comprehension. Functional naming of objects and pictures revealed usual anomia and neologisms. Usual max A required to ID and correct anomia/paraphasias. Given limited benefit of first letter and phonemic cues for anomia, targeted use of sentence starters to aid naming. Benefited from cued slowed rate and written cues. Intermittent mod A required to finish phrases due to  phonemic paraphasias. Good reading comprehension exhibited given multiple choice of 2-3 items. Writing also benefited corrections in verbal expression.   02-24-23: Educated and instructed patient and son on clinical observations, auditory comprehension strategies, and speech error modifications. Benefited from stating patient name to aid attention, slowed speech, stressed key words, and repetition. Reduced awareness of speech errors exhibited, in which SLP educated and instructed how to ID and correct errors. Benefited from consistent fading to intermittent visual and verbal modeling for correcting errors at word level. Updated HEP with family member education and functional naming.   02-20-23: Pt reports that he plays golf once a week on Thursdays. D/t length required to complete eval, education not initiated this date. Reading appears to be a general strength for the pt if he slows down and takes his  time. Plan to assess reading comprehension further within first few sessions.   PATIENT EDUCATION: Education details: See above Person educated: Patient Education method: Explanation, Demonstration, Verbal cues, and Handouts Education comprehension: verbalized understanding, returned demonstration, verbal cues required, and needs further education   GOALS: Goals reviewed with patient? Yes  SHORT TERM GOALS: Target date: 03/20/2023    Pt will ID and attempt to self correct paraphasias in 3/5 opportunities in a structured task across 2 sessions given mod-A.  Baseline: 03-20-23 Goal status: PARTIALLY MET  2.  Pt will use multi-modal communication to aid verbal expression in 3/5 opportunities in a structured task across 2 sessions given mod-A.  Baseline: 03-08-23 Goal status: PARTIALLY MET  3.  Pt will appropriately request repetition for auditory comprehension breakdown for 60% of opportunities during a structured task given occasional mod-A.  Baseline:  Goal status: NOT MET  4.  Pt will successfully carry over trained techniques to effectively communicate in 2 5-minute conversations given mod-A.  Baseline:  Goal status: NOT MET   LONG TERM GOALS: Target date: 04/17/2023     Pt will ID and self correct paraphasias in 50% of opportunities during a 10 minute structured conversation given occasional mod-A.  Baseline:  Goal status: IN PROGRESS  2.  Caregiver and/or pt will report perceived communicative improvement via PROM by d/c.  Baseline: 13/30 on communicative participation item bank- short form Goal status: IN PROGRESS  3.   Pt will implement communication supports PRN during conversation on personally relevant topics given min-A. Goal status: IN PROGRESS  4.  Pt will successfully implement auditory comprehension techniques to effectively communicate in 10-minute conversation given min-A.  Baseline:  Goal status: IN PROGRESS   ASSESSMENT:  CLINICAL IMPRESSION: Patient  is a 89 y.o. male who was seen today for aphasia s/p CVA. Pt presents with mild improvements in mod-severe aphasia (per QAB) c/b intermittent phonemic and neologistic paraphasias, fluent but vague speech due to anomia, reduced auditory comprehension, and limited awareness of deficits. Continued education and instruction of expressive and receptive interventions and strategies to maximize functional communication. Due to ongoing severity of anomia, paraphasias, and neologisms with reduced awareness, continue to recommend increased use of communication supports (written aids) and family assistance to optimize patient communication at home and performance on targeted home tasks. Pt would benefit from skilled ST to address aformentioned deficits and to improve pt's ability to effectively communicate at home and in the community.    OBJECTIVE IMPAIRMENTS: include expressive language, receptive language, and aphasia. These impairments are limiting patient from ADLs/IADLs and effectively communicating at home and in community. Factors affecting potential to achieve goals and functional outcome are  awareness of deficits .  Patient will benefit from skilled SLP services to address above impairments and improve overall function.  REHAB POTENTIAL: Good  PLAN:  SLP FREQUENCY: 2x/week  SLP DURATION: 8 weeks  PLANNED INTERVENTIONS: Language facilitation, Cueing hierachy, Multimodal communication approach, SLP instruction and feedback, Compensatory strategies, and Patient/family education    Gracy Racer, CCC-SLP 03/20/2023, 9:38 AM

## 2023-03-23 NOTE — Therapy (Signed)
OUTPATIENT SPEECH LANGUAGE PATHOLOGY APHASIA TREATMENT (PROGRESS NOTE)   Patient Name: Juan [name] MRN: [id number] DOB:1926-03-14, 89 y.o., male Today's Date: 03/24/2023  PCP: Gretta Cool REFERRING PROVIDER: Charlton Amor, PA-C   END OF SESSION:  End of Session - 03/24/23 0747     Visit Number 10    Number of Visits 17    Date for SLP Re-Evaluation 04/17/23    Authorization Type [employer] Medicare    SLP Start Time 0752    SLP Stop Time  0835    SLP Time Calculation (min) 43 min    Activity Tolerance Patient tolerated treatment well              Speech Therapy Progress Note  Dates of Reporting Period: 02/20/23 to current  Objective Reports of Subjective Statement: Pt has been seen for 10 ST visits targeting aphasia with some slow but steady progress made given severity of deficits.   Objective Measurements: Some mild improvements in anomia and awareness/self-correction of dysnomia noted during structured tasks. Continues to exhibit significant difficulty with auditory comprehension and anomia in basic conversation, with benefit of written aids/cues, rephrasing, and modeling.   Goal Update: see goals below  Plan: continue per POC  Reason Skilled Services are Required: Due to ongoing difficulty with functional communication, recommended continuing ST intervention at this time.       Past Medical History:  Diagnosis Date   Arrhythmia    Diverticulitis 2004   Hyperlipidemia    Prostate cancer (HCC)    Shingles outbreak    LEFT CHEST WALL   Past Surgical History:  Procedure Laterality Date   APPENDECTOMY     Patient Active Problem List   Diagnosis Date Noted   CVA (cerebral vascular accident) (HCC) 02/01/2023   Acute CVA (cerebrovascular accident) (HCC) 01/28/2023   Acute encephalopathy 01/27/2023   Acidosis 01/27/2023   Stage 3a chronic kidney disease (CKD) (HCC) 01/27/2023   PAF (paroxysmal atrial fibrillation) (HCC)    Hyperlipidemia     ONSET DATE:  01-27-23   REFERRING DIAG: I63.9 (ICD-10-CM) - Cerebral infarction, unspecified   THERAPY DIAG: Aphasia  Rationale for Evaluation and Treatment: Rehabilitation  SUBJECTIVE:   SUBJECTIVE STATEMENT: "good" Pt accompanied by: self   PERTINENT HISTORY: Juan [name] is a 89 y.o. male with past medical history of hyperlipidemia, prostate cancer and paroxysmal atrial fibrillation not on anticoagulation presented to the hospital with garbled speech, confusion and agitation.  Patient was subsequently found to CVA and admitted to the hospitalist service.   PAIN: Are you having pain? No  FALLS: Has patient fallen in last 6 months?  No  LIVING ENVIRONMENT: Lives with: lives alone (reports family member is currently helping at home) Lives in: House/apartment  PLOF:  Level of assistance: Independent with ADLs, Independent with IADLs Employment: Retired  PATIENT GOALS: "Be able to carry on conversations" per daughter  OBJECTIVE:  TODAY'S TREATMENT:  03-23-23: Returned with multi-syllabic words (2-5 syllables) homework. Pt identified 4 words that he had trouble pronouncing. Pt able to verbalize those words with 50% accuracy independently; however, usual max A (verbal and visual) required to correct errors. Targeted verbal expression and auditory comprehension related to functional 2-3 word syllable words. Intermittent max A required to segment syllables with choral reading and fading cues. Consistent written cues required to aid understanding of targeted questions (ex: what do you refrigerate?). Reduced comprehension and awareness of errors noted while answering personally relevant questions (birthday and phone number) requiring max A for SLP to repair communication breakdown. As pt is nearing end of scheduled visits, pt would benefit from additional ST sessions  as pt beginning to progress; plan to f/u with caregiver next session if available.   03-20-23: Returned homework with mostly accurate completion. Oral reading accuracy improved with intermittent cues to slow rate. Pt able to self-correct phonemic paraphasias with increased accuracy today given extra time and cues to slow rate. Good reading comprehension exhibited with rare cues required. Updated HEP to include oral reading of multi-syllabic words. Addressed STGs with some partially met goals due to modest progress thus far given severity of deficits.   03-17-23: Continued VNeST today, with frequent max A required to aid anomia and comprehension of WH-questions. Due to language impairment, pt perseverated on same responses due to overt difficulty with word retrieval. Sentence completions, first letters, and phonemic cues were ineffective to aid naming. Pt reliant on SLP writing targeted word for verbal naming and improved comprehension of objective. Continue to recommend family using written supports at home given adequate reading comprehension. Intermittent mod to max A required to correct paraphasias at word level with limited awareness of errors today. Dicussed with son in lobby re: level of difficulty exhibited and need for 1:1 assistance at home to aid receptive and expressive language for targeted tasks. Son reported assisting with homework and providing recommended communication supports.   03-13-23: Targeted VNeST today to address expressive and receptive communication. Usual max A required to ID varied responses for "who" d/t reduced comprehension of targeted variations despite written cues and modeling. Occasional mod A required to ID "what" responses and answer WH-questions to expand responses. Occasional min A required to slow rate of reading to optimize speech accuracy during oral reading. Additional education and training required with VNeST next session to optimize pt understanding and performance.  Provided structured task for functional naming at home. Pt appropriately request clarification x1 when SLP provided instructions.   03-08-23: Entered with homework given instructions to ID functional words necessary to communicate at home. It appeared random words were copied that did not appear related to his daily life given pt unable to express personal connection despite written supports for comprehension. Targeted functional naming of every day pictures, in which pt able to name with 50% accuracy independently. Pt demonstrated increased awareness of errors but required usual mod to max A to correct phonemic paraphasias. Provided examples of functional naming practice to be completed at home with assistance from family members.   03-06-23: Continues to benefit from consistent written aids to optimize comprehension. For functional conversation questions with written prompt, occasional max A required to aid understanding and correct verbal paraphasias. Some rare increasing awareness of verbal errors noted today. Approximately 10-20% ability to self-correct errors when SLP assisted with identification. Cued writing was rarely effective for naming today. Recommended functional oral naming practice at home for increased ability to functional communicate wants/needs at home.  03-01-23: Reviewed HEP that was returned. Difficult to engage performance as nothing was written as pt completed independently per son. Usual mod to max A required to correct verbal paraphasias while reading sentence completions and occasional written cues required to correct dysnomia, which in part seemed related to reading comprehension. Assessed reading comprehension further via RCVA-2. Overall reading comprehension and naming grossly WNL (given words and pictures), with occasional mod cues required to aid naming at word and sentence levels and rare mod cues required to aid comprehension of multi-sentence prompts. Greatly benefited from  cued slowed rate to aid comprehension and verbal naming. No awareness of errors exhibited during oral reading, in which SLP trialed auditory recordings and visual cues with limited benefit. Given reading as relative strength, recommended family write down pertinent words for patient use at home when attempting to communicate.   02-27-23: Targeted verbal expression and reading comprehension. Functional naming of objects and pictures revealed usual anomia and neologisms. Usual max A required to ID and correct anomia/paraphasias. Given limited benefit of first letter and phonemic cues for anomia, targeted use of sentence starters to aid naming. Benefited from cued slowed rate and written cues. Intermittent mod A required to finish phrases due to phonemic paraphasias. Good reading comprehension exhibited given multiple choice of 2-3 items. Writing also benefited corrections in verbal expression.   02-24-23: Educated and instructed patient and son on clinical observations, auditory comprehension strategies, and speech error modifications. Benefited from stating patient name to aid attention, slowed speech, stressed key words, and repetition. Reduced awareness of speech errors exhibited, in which SLP educated and instructed how to ID and correct errors. Benefited from consistent fading to intermittent visual and verbal modeling for correcting errors at word level. Updated HEP with family member education and functional naming.   02-20-23: Pt reports that he plays golf once a week on Thursdays. D/t length required to complete eval, education not initiated this date. Reading appears to be a general strength for the pt if he slows down and takes his time. Plan to assess reading comprehension further within first few sessions.   PATIENT EDUCATION: Education details: See above Person educated: Patient Education method: Explanation, Demonstration, Verbal cues, and Handouts Education comprehension: verbalized  understanding, returned demonstration, verbal cues required, and needs further education   GOALS: Goals reviewed with patient? Yes  SHORT TERM GOALS: Target date: 03/20/2023    Pt will ID and attempt to self correct paraphasias in 3/5 opportunities in a structured task across 2 sessions given mod-A.  Baseline: 03-20-23 Goal status: PARTIALLY MET  2.  Pt will use multi-modal communication to aid verbal expression in 3/5 opportunities in a structured task across 2 sessions given mod-A.  Baseline: 03-08-23 Goal status: PARTIALLY MET  3.  Pt will appropriately request repetition for auditory comprehension breakdown for 60% of opportunities during a structured task given occasional mod-A.  Baseline:  Goal status: NOT MET  4.  Pt will successfully carry over trained techniques to effectively communicate in 2 5-minute conversations given mod-A.  Baseline:  Goal status: NOT MET   LONG TERM GOALS: Target date: 04/17/2023     Pt will ID and self correct paraphasias in 50% of opportunities during a 10 minute structured conversation given occasional mod-A.  Baseline:  Goal status: IN PROGRESS  2.  Caregiver and/or pt will report perceived communicative improvement via PROM by d/c.  Baseline: 13/30 on communicative participation item bank- short form Goal status: IN PROGRESS  3.   Pt will implement communication  supports PRN during conversation on personally relevant topics given min-A. Goal status: IN PROGRESS  4.  Pt will successfully implement auditory comprehension techniques to effectively communicate in 10-minute conversation given min-A.  Baseline:  Goal status: IN PROGRESS   ASSESSMENT:  CLINICAL IMPRESSION: Patient is a 89 y.o. male who was seen today for aphasia s/p CVA. Pt presents with slow but steady mild improvements in mod-severe aphasia (per QAB) c/b intermittent phonemic and neologistic paraphasias on structured tasks, fluent but vague speech due to anomia, reduced  auditory comprehension, and limited awareness of deficits. Continued education and instruction of expressive and receptive interventions and strategies to maximize functional communication. Due to ongoing severity of anomia, paraphasias, and neologisms with reduced awareness, continue to recommend increased use of communication supports (written aids) and family assistance to optimize patient communication at home and performance on targeted home tasks. Pt would benefit from skilled ST to address aformentioned deficits and to improve pt's ability to effectively communicate at home and in the community.    OBJECTIVE IMPAIRMENTS: include expressive language, receptive language, and aphasia. These impairments are limiting patient from ADLs/IADLs and effectively communicating at home and in community. Factors affecting potential to achieve goals and functional outcome are  awareness of deficits . Patient will benefit from skilled SLP services to address above impairments and improve overall function.  REHAB POTENTIAL: Good  PLAN:  SLP FREQUENCY: 2x/week  SLP DURATION: 8 weeks  PLANNED INTERVENTIONS: Language facilitation, Cueing hierachy, Multimodal communication approach, SLP instruction and feedback, Compensatory strategies, and Patient/family education    Gracy Racer, CCC-SLP 03/24/2023, 8:41 AM

## 2023-03-24 ENCOUNTER — Ambulatory Visit: Payer: Medicare Other

## 2023-03-24 DIAGNOSIS — R4701 Aphasia: Secondary | ICD-10-CM | POA: Diagnosis not present

## 2023-03-24 NOTE — Patient Instructions (Addendum)
Continue to practice reading your multisyllabic words.  Read slowly aloud with family. Let them help you identify if the word comes out wrong.  Say it again until you get it correct. You may need to break down the syllables (ex: prin-ci-pal)  I continue to see Graciela having a hard time understanding.  He benefits from written information and multiple choices   To work on his comprehension and word finding, ask concrete questions out loud.  When's your birthday? What's your phone number? What's your favorite food? What is your favorite sports team?

## 2023-03-27 ENCOUNTER — Ambulatory Visit: Payer: Medicare Other

## 2023-03-27 DIAGNOSIS — R4701 Aphasia: Secondary | ICD-10-CM

## 2023-03-27 NOTE — Patient Instructions (Signed)
Wednesday is your last scheduled Speech Therapy session. I would like to add some more. Please bring Juan [name] or Juan [name] to help [place] decide how many more.   You are getting better at understanding!  Things to remember,  Ask for repetition if you don't understand  Partners: Say it slowly  Emphasize key words  Repeat it  Write key words

## 2023-03-27 NOTE — Therapy (Signed)
OUTPATIENT SPEECH LANGUAGE PATHOLOGY APHASIA TREATMENT    Patient Name: Juan [name] MRN: [id number] DOB:07/21/1926, 89 y.o., male Today's Date: 03/27/2023  PCP: Gretta Cool REFERRING PROVIDER: Charlton Amor, PA-C   END OF SESSION:  End of Session - 03/27/23 0752     Visit Number 11    Number of Visits 17    Date for SLP Re-Evaluation 04/17/23    Authorization Type [employer] Medicare    SLP Start Time 0758    SLP Stop Time  0840    SLP Time Calculation (min) 42 min    Activity Tolerance Patient tolerated treatment well                     Past Medical History:  Diagnosis Date   Arrhythmia    Diverticulitis 2004   Hyperlipidemia    Prostate cancer (HCC)    Shingles outbreak    LEFT CHEST WALL   Past Surgical History:  Procedure Laterality Date   APPENDECTOMY     Patient Active Problem List   Diagnosis Date Noted   CVA (cerebral vascular accident) (HCC) 02/01/2023   Acute CVA (cerebrovascular accident) (HCC) 01/28/2023   Acute encephalopathy 01/27/2023   Acidosis 01/27/2023   Stage 3a chronic kidney disease (CKD) (HCC) 01/27/2023   PAF (paroxysmal atrial fibrillation) (HCC)    Hyperlipidemia     ONSET DATE: 01-27-23   REFERRING DIAG: I63.9 (ICD-10-CM) - Cerebral infarction, unspecified   THERAPY DIAG: Aphasia  Rationale for Evaluation and Treatment: Rehabilitation  SUBJECTIVE:   SUBJECTIVE STATEMENT: "It was good" Pt accompanied by: self   PERTINENT HISTORY: Juan [name] is a 89 y.o. male with past medical history of hyperlipidemia, prostate cancer and paroxysmal atrial fibrillation not on anticoagulation presented to the hospital with garbled speech, confusion and agitation.  Patient was subsequently found to CVA and admitted to the hospitalist service.   PAIN: Are you having pain? No  FALLS: Has patient fallen in last 6 months?  No  LIVING ENVIRONMENT: Lives with: lives alone (reports family member is currently helping at home) Lives  in: House/apartment  PLOF:  Level of assistance: Independent with ADLs, Independent with IADLs Employment: Retired  PATIENT GOALS: "Be able to carry on conversations" per daughter  OBJECTIVE:  TODAY'S TREATMENT:                                                                                                                                          03-27-23: Pt presents with improving functional communication for understanding and answering basic questions. Continues to complete reading practice of multi-syllabic words, in which pt able to read familiar 3 syllables work with ~70% accuracy and self-correct errors with ~60% accuracy. Increased awareness of errors exhibited with pt looking to therapist for visual placement cues. Pt read aloud new minimal pairs with ~80% accuracy with intermittent self-corrections. Targeted answering comprehension questions related to  targeted words, in which pt able to answer accurately with ~70% accuracy with occasional self-requested repetitions. Pt required 1-2 repetitions on average to comprehend each question, with SLP utilizing slow, stressed speech and occasionally written cues (key words). Generated simple sentences with targeted words, in which pt able to complete with ~50% accuracy due to anomia and impaired sentence structured. Intermittent mod to max A required to correct.   03-23-23: Returned with multi-syllabic words (2-5 syllables) homework. Pt identified 4 words that he had trouble pronouncing. Pt able to verbalize those words with 50% accuracy independently; however, usual max A (verbal and visual) required to correct errors. Targeted verbal expression and auditory comprehension related to functional 2-3 word syllable words. Intermittent max A required to segment syllables with choral reading and fading cues. Consistent written cues required to aid understanding of targeted questions (ex: what do you refrigerate?). Reduced comprehension and awareness of errors  noted while answering personally relevant questions (birthday and phone number) requiring max A for SLP to repair communication breakdown. As pt is nearing end of scheduled visits, pt would benefit from additional ST sessions as pt beginning to progress; plan to f/u with caregiver next session if available.   03-20-23: Returned homework with mostly accurate completion. Oral reading accuracy improved with intermittent cues to slow rate. Pt able to self-correct phonemic paraphasias with increased accuracy today given extra time and cues to slow rate. Good reading comprehension exhibited with rare cues required. Updated HEP to include oral reading of multi-syllabic words. Addressed STGs with some partially met goals due to modest progress thus far given severity of deficits.   03-17-23: Continued VNeST today, with frequent max A required to aid anomia and comprehension of WH-questions. Due to language impairment, pt perseverated on same responses due to overt difficulty with word retrieval. Sentence completions, first letters, and phonemic cues were ineffective to aid naming. Pt reliant on SLP writing targeted word for verbal naming and improved comprehension of objective. Continue to recommend family using written supports at home given adequate reading comprehension. Intermittent mod to max A required to correct paraphasias at word level with limited awareness of errors today. Dicussed with son in lobby re: level of difficulty exhibited and need for 1:1 assistance at home to aid receptive and expressive language for targeted tasks. Son reported assisting with homework and providing recommended communication supports.   03-13-23: Targeted VNeST today to address expressive and receptive communication. Usual max A required to ID varied responses for "who" d/t reduced comprehension of targeted variations despite written cues and modeling. Occasional mod A required to ID "what" responses and answer WH-questions to  expand responses. Occasional min A required to slow rate of reading to optimize speech accuracy during oral reading. Additional education and training required with VNeST next session to optimize pt understanding and performance. Provided structured task for functional naming at home. Pt appropriately request clarification x1 when SLP provided instructions.   03-08-23: Entered with homework given instructions to ID functional words necessary to communicate at home. It appeared random words were copied that did not appear related to his daily life given pt unable to express personal connection despite written supports for comprehension. Targeted functional naming of every day pictures, in which pt able to name with 50% accuracy independently. Pt demonstrated increased awareness of errors but required usual mod to max A to correct phonemic paraphasias. Provided examples of functional naming practice to be completed at home with assistance from family members.   03-06-23: Continues to benefit from  consistent written aids to optimize comprehension. For functional conversation questions with written prompt, occasional max A required to aid understanding and correct verbal paraphasias. Some rare increasing awareness of verbal errors noted today. Approximately 10-20% ability to self-correct errors when SLP assisted with identification. Cued writing was rarely effective for naming today. Recommended functional oral naming practice at home for increased ability to functional communicate wants/needs at home.   03-01-23: Reviewed HEP that was returned. Difficult to engage performance as nothing was written as pt completed independently per son. Usual mod to max A required to correct verbal paraphasias while reading sentence completions and occasional written cues required to correct dysnomia, which in part seemed related to reading comprehension. Assessed reading comprehension further via RCVA-2. Overall reading  comprehension and naming grossly WNL (given words and pictures), with occasional mod cues required to aid naming at word and sentence levels and rare mod cues required to aid comprehension of multi-sentence prompts. Greatly benefited from cued slowed rate to aid comprehension and verbal naming. No awareness of errors exhibited during oral reading, in which SLP trialed auditory recordings and visual cues with limited benefit. Given reading as relative strength, recommended family write down pertinent words for patient use at home when attempting to communicate.   02-27-23: Targeted verbal expression and reading comprehension. Functional naming of objects and pictures revealed usual anomia and neologisms. Usual max A required to ID and correct anomia/paraphasias. Given limited benefit of first letter and phonemic cues for anomia, targeted use of sentence starters to aid naming. Benefited from cued slowed rate and written cues. Intermittent mod A required to finish phrases due to phonemic paraphasias. Good reading comprehension exhibited given multiple choice of 2-3 items. Writing also benefited corrections in verbal expression.   02-24-23: Educated and instructed patient and son on clinical observations, auditory comprehension strategies, and speech error modifications. Benefited from stating patient name to aid attention, slowed speech, stressed key words, and repetition. Reduced awareness of speech errors exhibited, in which SLP educated and instructed how to ID and correct errors. Benefited from consistent fading to intermittent visual and verbal modeling for correcting errors at word level. Updated HEP with family member education and functional naming.   02-20-23: Pt reports that he plays golf once a week on Thursdays. D/t length required to complete eval, education not initiated this date. Reading appears to be a general strength for the pt if he slows down and takes his time. Plan to assess reading  comprehension further within first few sessions.   PATIENT EDUCATION: Education details: See above Person educated: Patient Education method: Explanation, Demonstration, Verbal cues, and Handouts Education comprehension: verbalized understanding, returned demonstration, verbal cues required, and needs further education   GOALS: Goals reviewed with patient? Yes  SHORT TERM GOALS: Target date: 03/20/2023    Pt will ID and attempt to self correct paraphasias in 3/5 opportunities in a structured task across 2 sessions given mod-A.  Baseline: 03-20-23 Goal status: PARTIALLY MET  2.  Pt will use multi-modal communication to aid verbal expression in 3/5 opportunities in a structured task across 2 sessions given mod-A.  Baseline: 03-08-23 Goal status: PARTIALLY MET  3.  Pt will appropriately request repetition for auditory comprehension breakdown for 60% of opportunities during a structured task given occasional mod-A.  Baseline:  Goal status: NOT MET  4.  Pt will successfully carry over trained techniques to effectively communicate in 2 5-minute conversations given mod-A.  Baseline:  Goal status: NOT MET   LONG TERM GOALS: Target  date: 04/17/2023     Pt will ID and self correct paraphasias in 50% of opportunities during a 10 minute structured conversation given occasional mod-A.  Baseline:  Goal status: IN PROGRESS  2.  Caregiver and/or pt will report perceived communicative improvement via PROM by d/c.  Baseline: 13/30 on communicative participation item bank- short form Goal status: IN PROGRESS  3.   Pt will implement communication supports PRN during conversation on personally relevant topics given min-A. Goal status: IN PROGRESS  4.  Pt will successfully implement auditory comprehension techniques to effectively communicate in 10-minute conversation given min-A.  Baseline:  Goal status: IN PROGRESS   ASSESSMENT:  CLINICAL IMPRESSION: Patient is a 89 y.o. male who was  seen today for aphasia s/p CVA. Pt presents with slow but steady mild improvements in mod-severe aphasia (per QAB) c/b less frequent phonemic and neologistic paraphasias on structured tasks, fluent but vague speech due to anomia, improving auditory comprehension, and increasing awareness of deficits. Continued education and instruction of expressive and receptive interventions and strategies to maximize functional communication. Due to ongoing severity of anomia, paraphasias, and neologisms with reduced awareness, continue to recommend increased use of communication supports (written aids) and family assistance to optimize patient communication at home and performance on targeted home tasks. Pt would benefit from skilled ST to address aformentioned deficits and to improve pt's ability to effectively communicate at home and in the community.    OBJECTIVE IMPAIRMENTS: include expressive language, receptive language, and aphasia. These impairments are limiting patient from ADLs/IADLs and effectively communicating at home and in community. Factors affecting potential to achieve goals and functional outcome are  awareness of deficits . Patient will benefit from skilled SLP services to address above impairments and improve overall function.  REHAB POTENTIAL: Good  PLAN:  SLP FREQUENCY: 2x/week  SLP DURATION: 8 weeks  PLANNED INTERVENTIONS: Language facilitation, Cueing hierachy, Multimodal communication approach, SLP instruction and feedback, Compensatory strategies, and Patient/family education    Gracy Racer, CCC-SLP 03/27/2023, 9:35 AM

## 2023-03-28 ENCOUNTER — Other Ambulatory Visit: Payer: Self-pay | Admitting: Physical Medicine & Rehabilitation

## 2023-03-28 NOTE — Therapy (Unsigned)
OUTPATIENT SPEECH LANGUAGE PATHOLOGY APHASIA TREATMENT    Patient Name: Juan [name] MRN: [id number] DOB:01-23-1926, 89 y.o., male Today's Date: 03/29/2023  PCP: Gretta Cool REFERRING PROVIDER: Charlton Amor, PA-C   END OF SESSION:  End of Session - 03/29/23 0754     Visit Number 12    Number of Visits 17    Date for SLP Re-Evaluation 04/17/23    Authorization Type [employer] Medicare    SLP Start Time 0800    SLP Stop Time  0845    SLP Time Calculation (min) 45 min    Activity Tolerance Patient tolerated treatment well                      Past Medical History:  Diagnosis Date   Arrhythmia    Diverticulitis 2004   Hyperlipidemia    Prostate cancer (HCC)    Shingles outbreak    LEFT CHEST WALL   Past Surgical History:  Procedure Laterality Date   APPENDECTOMY     Patient Active Problem List   Diagnosis Date Noted   CVA (cerebral vascular accident) (HCC) 02/01/2023   Acute CVA (cerebrovascular accident) (HCC) 01/28/2023   Acute encephalopathy 01/27/2023   Acidosis 01/27/2023   Stage 3a chronic kidney disease (CKD) (HCC) 01/27/2023   PAF (paroxysmal atrial fibrillation) (HCC)    Hyperlipidemia     ONSET DATE: 01-27-23   REFERRING DIAG: I63.9 (ICD-10-CM) - Cerebral infarction, unspecified   THERAPY DIAG: Aphasia  Rationale for Evaluation and Treatment: Rehabilitation  SUBJECTIVE:   SUBJECTIVE STATEMENT: "He's made progress since coming" Pt accompanied by: self   PERTINENT HISTORY: [name] is a 89 y.o. male with past medical history of hyperlipidemia, prostate cancer and paroxysmal atrial fibrillation not on anticoagulation presented to the hospital with garbled speech, confusion and agitation.  Patient was subsequently found to CVA and admitted to the hospitalist service.   PAIN: Are you having pain? No  FALLS: Has patient fallen in last 6 months?  No  LIVING ENVIRONMENT: Lives with: lives alone (reports family member is currently  helping at home) Lives in: House/apartment  PLOF:  Level of assistance: Independent with ADLs, Independent with IADLs Employment: Retired  PATIENT GOALS: "Be able to carry on conversations" per daughter  OBJECTIVE:  TODAY'S TREATMENT:                                                                                                                                          03-29-23: Family members, Maurice March and Mart, attended today's session for caregiver education. Both reported pt exhibiting improved communication effectiveness at home, including effectively talking on the phone and telling family what he wants to eat. Intermittent use of gestures or pointing reported if anomia/dysnomia occurs. Today, SLP demonstrated how to support communication with cueing hierarchy and written supports. In functional naming task related to favorite summer activity and sports,  pt demonstrated improved auditory comprehension with less frequent repetition needed and increasing error awareness with intermittent self-corrections in short structured conversations. Occasional cues provided to family members to allow patient opportunity to speak first and/or provide cues versus word. Given recent increased progress and new expectation for pt to spend some time alone in next few months, pt would benefit from additional sessions to optimize communication in real life scenarios (ex: calling for help). Provided Aphasia Card to provide in case of emergency.   03-27-23: Pt presents with improving functional communication for understanding and answering basic questions. Continues to complete reading practice of multi-syllabic words, in which pt able to read familiar 3 syllables work with ~70% accuracy and self-correct errors with ~60% accuracy. Increased awareness of errors exhibited with pt looking to therapist for visual placement cues. Pt read aloud new minimal pairs with ~80% accuracy with intermittent self-corrections. Targeted  answering comprehension questions related to targeted words, in which pt able to answer accurately with ~70% accuracy with occasional self-requested repetitions. Pt required 1-2 repetitions on average to comprehend each question, with SLP utilizing slow, stressed speech and occasionally written cues (key words). Generated simple sentences with targeted words, in which pt able to complete with ~50% accuracy due to anomia and impaired sentence structured. Intermittent mod to max A required to correct.   03-23-23: Returned with multi-syllabic words (2-5 syllables) homework. Pt identified 4 words that he had trouble pronouncing. Pt able to verbalize those words with 50% accuracy independently; however, usual max A (verbal and visual) required to correct errors. Targeted verbal expression and auditory comprehension related to functional 2-3 word syllable words. Intermittent max A required to segment syllables with choral reading and fading cues. Consistent written cues required to aid understanding of targeted questions (ex: what do you refrigerate?). Reduced comprehension and awareness of errors noted while answering personally relevant questions (birthday and phone number) requiring max A for SLP to repair communication breakdown. As pt is nearing end of scheduled visits, pt would benefit from additional ST sessions as pt beginning to progress; plan to f/u with caregiver next session if available.   03-20-23: Returned homework with mostly accurate completion. Oral reading accuracy improved with intermittent cues to slow rate. Pt able to self-correct phonemic paraphasias with increased accuracy today given extra time and cues to slow rate. Good reading comprehension exhibited with rare cues required. Updated HEP to include oral reading of multi-syllabic words. Addressed STGs with some partially met goals due to modest progress thus far given severity of deficits.   03-17-23: Continued VNeST today, with frequent max A  required to aid anomia and comprehension of WH-questions. Due to language impairment, pt perseverated on same responses due to overt difficulty with word retrieval. Sentence completions, first letters, and phonemic cues were ineffective to aid naming. Pt reliant on SLP writing targeted word for verbal naming and improved comprehension of objective. Continue to recommend family using written supports at home given adequate reading comprehension. Intermittent mod to max A required to correct paraphasias at word level with limited awareness of errors today. Dicussed with son in lobby re: level of difficulty exhibited and need for 1:1 assistance at home to aid receptive and expressive language for targeted tasks. Son reported assisting with homework and providing recommended communication supports.   03-13-23: Targeted VNeST today to address expressive and receptive communication. Usual max A required to ID varied responses for "who" d/t reduced comprehension of targeted variations despite written cues and modeling. Occasional mod A required to ID "  what" responses and answer WH-questions to expand responses. Occasional min A required to slow rate of reading to optimize speech accuracy during oral reading. Additional education and training required with VNeST next session to optimize pt understanding and performance. Provided structured task for functional naming at home. Pt appropriately request clarification x1 when SLP provided instructions.   03-08-23: Entered with homework given instructions to ID functional words necessary to communicate at home. It appeared random words were copied that did not appear related to his daily life given pt unable to express personal connection despite written supports for comprehension. Targeted functional naming of every day pictures, in which pt able to name with 50% accuracy independently. Pt demonstrated increased awareness of errors but required usual mod to max A to correct  phonemic paraphasias. Provided examples of functional naming practice to be completed at home with assistance from family members.   03-06-23: Continues to benefit from consistent written aids to optimize comprehension. For functional conversation questions with written prompt, occasional max A required to aid understanding and correct verbal paraphasias. Some rare increasing awareness of verbal errors noted today. Approximately 10-20% ability to self-correct errors when SLP assisted with identification. Cued writing was rarely effective for naming today. Recommended functional oral naming practice at home for increased ability to functional communicate wants/needs at home.   03-01-23: Reviewed HEP that was returned. Difficult to engage performance as nothing was written as pt completed independently per son. Usual mod to max A required to correct verbal paraphasias while reading sentence completions and occasional written cues required to correct dysnomia, which in part seemed related to reading comprehension. Assessed reading comprehension further via RCVA-2. Overall reading comprehension and naming grossly WNL (given words and pictures), with occasional mod cues required to aid naming at word and sentence levels and rare mod cues required to aid comprehension of multi-sentence prompts. Greatly benefited from cued slowed rate to aid comprehension and verbal naming. No awareness of errors exhibited during oral reading, in which SLP trialed auditory recordings and visual cues with limited benefit. Given reading as relative strength, recommended family write down pertinent words for patient use at home when attempting to communicate.   02-27-23: Targeted verbal expression and reading comprehension. Functional naming of objects and pictures revealed usual anomia and neologisms. Usual max A required to ID and correct anomia/paraphasias. Given limited benefit of first letter and phonemic cues for anomia, targeted use  of sentence starters to aid naming. Benefited from cued slowed rate and written cues. Intermittent mod A required to finish phrases due to phonemic paraphasias. Good reading comprehension exhibited given multiple choice of 2-3 items. Writing also benefited corrections in verbal expression.   02-24-23: Educated and instructed patient and son on clinical observations, auditory comprehension strategies, and speech error modifications. Benefited from stating patient name to aid attention, slowed speech, stressed key words, and repetition. Reduced awareness of speech errors exhibited, in which SLP educated and instructed how to ID and correct errors. Benefited from consistent fading to intermittent visual and verbal modeling for correcting errors at word level. Updated HEP with family member education and functional naming.   02-20-23: Pt reports that he plays golf once a week on Thursdays. D/t length required to complete eval, education not initiated this date. Reading appears to be a general strength for the pt if he slows down and takes his time. Plan to assess reading comprehension further within first few sessions.   PATIENT EDUCATION: Education details: See above Person educated: Patient Education method: Explanation,  Demonstration, Verbal cues, and Handouts Education comprehension: verbalized understanding, returned demonstration, verbal cues required, and needs further education   GOALS: Goals reviewed with patient? Yes  SHORT TERM GOALS: Target date: 03/20/2023    Pt will ID and attempt to self correct paraphasias in 3/5 opportunities in a structured task across 2 sessions given mod-A.  Baseline: 03-20-23 Goal status: PARTIALLY MET  2.  Pt will use multi-modal communication to aid verbal expression in 3/5 opportunities in a structured task across 2 sessions given mod-A.  Baseline: 03-08-23 Goal status: PARTIALLY MET  3.  Pt will appropriately request repetition for auditory comprehension  breakdown for 60% of opportunities during a structured task given occasional mod-A.  Baseline:  Goal status: NOT MET  4.  Pt will successfully carry over trained techniques to effectively communicate in 2 5-minute conversations given mod-A.  Baseline:  Goal status: NOT MET   LONG TERM GOALS: Target date: 04/17/2023     Pt will ID and self correct paraphasias in 50% of opportunities during a 10 minute structured conversation given occasional mod-A.  Baseline:  Goal status: IN PROGRESS  2.  Caregiver and/or pt will report perceived communicative improvement via PROM by d/c.  Baseline: 13/30 on communicative participation item bank- short form Goal status: IN PROGRESS  3.   Pt will implement communication supports PRN during conversation on personally relevant topics given min-A. Goal status: IN PROGRESS  4.  Pt will successfully implement auditory comprehension techniques to effectively communicate in 10-minute conversation given min-A.  Baseline:  Goal status: IN PROGRESS   ASSESSMENT:  CLINICAL IMPRESSION: Patient is a 89 y.o. male who was seen today for aphasia s/p CVA. Pt presents with slow but steady mild improvements in mod-severe aphasia (per QAB) c/b less frequent phonemic and neologistic paraphasias on structured tasks, fluent but vague speech due to anomia, improving auditory comprehension, and increasing awareness of deficits. Continued education and instruction of expressive and receptive interventions and strategies to maximize functional communication. Due to ongoing severity of anomia, paraphasias, and neologisms with reduced awareness, continue to recommend increased use of communication supports (written aids) and family assistance to optimize patient communication at home and performance on targeted home tasks. Pt would benefit from skilled ST to address aformentioned deficits and to improve pt's ability to effectively communicate at home and in the community.     OBJECTIVE IMPAIRMENTS: include expressive language, receptive language, and aphasia. These impairments are limiting patient from ADLs/IADLs and effectively communicating at home and in community. Factors affecting potential to achieve goals and functional outcome are  awareness of deficits . Patient will benefit from skilled SLP services to address above impairments and improve overall function.  REHAB POTENTIAL: Good  PLAN:  SLP FREQUENCY: 2x/week  SLP DURATION: 8 weeks  PLANNED INTERVENTIONS: Language facilitation, Cueing hierachy, Multimodal communication approach, SLP instruction and feedback, Compensatory strategies, and Patient/family education    Gracy Racer, CCC-SLP 03/29/2023, 10:22 AM

## 2023-03-29 ENCOUNTER — Ambulatory Visit: Payer: Medicare Other

## 2023-03-29 DIAGNOSIS — R4701 Aphasia: Secondary | ICD-10-CM | POA: Diagnosis not present

## 2023-04-04 ENCOUNTER — Other Ambulatory Visit (HOSPITAL_COMMUNITY): Payer: Self-pay

## 2023-04-04 ENCOUNTER — Other Ambulatory Visit: Payer: Self-pay | Admitting: Physical Medicine & Rehabilitation

## 2023-04-04 ENCOUNTER — Telehealth: Payer: Self-pay

## 2023-04-04 MED ORDER — QUETIAPINE FUMARATE 25 MG PO TABS: 25.0000 mg | ORAL_TABLET | Freq: Every day | ORAL | 1 refills | Status: DC

## 2023-04-04 MED ORDER — APIXABAN 5 MG PO TABS: 5.0000 mg | ORAL_TABLET | Freq: Two times a day (BID) | ORAL | 1 refills | Status: DC

## 2023-04-04 MED ORDER — ROSUVASTATIN CALCIUM 20 MG PO TABS: 20.0000 mg | ORAL_TABLET | Freq: Every day | ORAL | 1 refills | Status: DC

## 2023-04-04 NOTE — Telephone Encounter (Signed)
Mr. Juan [name] will run out his medications today. Lane (Daughter) is trying to get a follow up with the  PCP, but has not been successful. She wanted to know if you will send in a 2 month supply?  Eliquis, Crestor & Seroquel.

## 2023-04-05 ENCOUNTER — Ambulatory Visit: Payer: Medicare Other

## 2023-04-05 DIAGNOSIS — R4701 Aphasia: Secondary | ICD-10-CM

## 2023-04-05 NOTE — Therapy (Signed)
OUTPATIENT SPEECH LANGUAGE PATHOLOGY APHASIA TREATMENT    Patient Name: Juan [name] MRN: [id number] DOB:[birth year], 89 y.o., male Today's Date: 04/05/2023  PCP: Gretta Cool REFERRING PROVIDER: Charlton Amor, PA-C   END OF SESSION:  End of Session - 04/05/23 0754     Visit Number 13    Number of Visits 17    Date for SLP Re-Evaluation 04/17/23    Authorization Type [employer] Medicare    SLP Start Time 0800    SLP Stop Time  0843    SLP Time Calculation (min) 43 min    Activity Tolerance Patient tolerated treatment well                      Past Medical History:  Diagnosis Date   Arrhythmia    Diverticulitis 2004   Hyperlipidemia    Prostate cancer (HCC)    Shingles outbreak    LEFT CHEST WALL   Past Surgical History:  Procedure Laterality Date   APPENDECTOMY     Patient Active Problem List   Diagnosis Date Noted   CVA (cerebral vascular accident) (HCC) 02/01/2023   Acute CVA (cerebrovascular accident) (HCC) 01/28/2023   Acute encephalopathy 01/27/2023   Acidosis 01/27/2023   Stage 3a chronic kidney disease (CKD) (HCC) 01/27/2023   PAF (paroxysmal atrial fibrillation) (HCC)    Hyperlipidemia     ONSET DATE: 01-27-23   REFERRING DIAG: I63.9 (ICD-10-CM) - Cerebral infarction, unspecified   THERAPY DIAG: Aphasia  Rationale for Evaluation and Treatment: Rehabilitation  SUBJECTIVE:   SUBJECTIVE STATEMENT: "doing well." Pt accompanied by: self   PERTINENT HISTORY: Juan [name] is a 89 y.o. male with past medical history of hyperlipidemia, prostate cancer and paroxysmal atrial fibrillation not on anticoagulation presented to the hospital with garbled speech, confusion and agitation.  Patient was subsequently found to CVA and admitted to the hospitalist service.   PAIN: Are you having pain? No  FALLS: Has patient fallen in last 6 months?  No  LIVING ENVIRONMENT: Lives with: lives alone (reports family member is currently helping at  home) Lives in: House/apartment  PLOF:  Level of assistance: Independent with ADLs, Independent with IADLs Employment: Retired  PATIENT GOALS: "Be able to carry on conversations" per daughter  OBJECTIVE:  TODAY'S TREATMENT:                                                                                                                                          04-05-23: Targeted paraphasias through semantic feature analysis. Patient was able to describe various objects with mod to max-A at 70% accuracy. Pt demonstrated improved accuracy of semantic features with gestural cues, semantic and phonemic cuing. Pt demonstrated intermittent awareness of his verbal errors and made attempts to self correct with min prompting. Models needed to increase articulation of various multi-syllable words. Auditory comprehension increased when with repetition, rephrasing and cued eye contact  with/from the SLP. Patient guided through responding to questions in full sentences with visual models and cues. Patient able to read in complete sentences with 100% accuracy when provided with model.   03-29-23: Family members, Maurice March and [place], attended today's session for caregiver education. Both reported pt exhibiting improved communication effectiveness at home, including effectively talking on the phone and telling family what he wants to eat. Intermittent use of gestures or pointing reported if anomia/dysnomia occurs. Today, SLP demonstrated how to support communication with cueing hierarchy and written supports. In functional naming task related to favorite summer activity and sports, pt demonstrated improved auditory comprehension with less frequent repetition needed and increasing error awareness with intermittent self-corrections in short structured conversations. Occasional cues provided to family members to allow patient opportunity to speak first and/or provide cues versus word. Given recent increased progress and new  expectation for pt to spend some time alone in next few months, pt would benefit from additional sessions to optimize communication in real life scenarios (ex: calling for help). Provided Aphasia Card to provide in case of emergency.   03-27-23: Pt presents with improving functional communication for understanding and answering basic questions. Continues to complete reading practice of multi-syllabic words, in which pt able to read familiar 3 syllables work with ~70% accuracy and self-correct errors with ~60% accuracy. Increased awareness of errors exhibited with pt looking to therapist for visual placement cues. Pt read aloud new minimal pairs with ~80% accuracy with intermittent self-corrections. Targeted answering comprehension questions related to targeted words, in which pt able to answer accurately with ~70% accuracy with occasional self-requested repetitions. Pt required 1-2 repetitions on average to comprehend each question, with SLP utilizing slow, stressed speech and occasionally written cues (key words). Generated simple sentences with targeted words, in which pt able to complete with ~50% accuracy due to anomia and impaired sentence structured. Intermittent mod to max A required to correct.   03-23-23: Returned with multi-syllabic words (2-5 syllables) homework. Pt identified 4 words that he had trouble pronouncing. Pt able to verbalize those words with 50% accuracy independently; however, usual max A (verbal and visual) required to correct errors. Targeted verbal expression and auditory comprehension related to functional 2-3 word syllable words. Intermittent max A required to segment syllables with choral reading and fading cues. Consistent written cues required to aid understanding of targeted questions (ex: what do you refrigerate?). Reduced comprehension and awareness of errors noted while answering personally relevant questions (birthday and phone number) requiring max A for SLP to repair  communication breakdown. As pt is nearing end of scheduled visits, pt would benefit from additional ST sessions as pt beginning to progress; plan to f/u with caregiver next session if available.   03-20-23: Returned homework with mostly accurate completion. Oral reading accuracy improved with intermittent cues to slow rate. Pt able to self-correct phonemic paraphasias with increased accuracy today given extra time and cues to slow rate. Good reading comprehension exhibited with rare cues required. Updated HEP to include oral reading of multi-syllabic words. Addressed STGs with some partially met goals due to modest progress thus far given severity of deficits.   03-17-23: Continued VNeST today, with frequent max A required to aid anomia and comprehension of WH-questions. Due to language impairment, pt perseverated on same responses due to overt difficulty with word retrieval. Sentence completions, first letters, and phonemic cues were ineffective to aid naming. Pt reliant on SLP writing targeted word for verbal naming and improved comprehension of objective. Continue to recommend family  using written supports at home given adequate reading comprehension. Intermittent mod to max A required to correct paraphasias at word level with limited awareness of errors today. Dicussed with son in lobby re: level of difficulty exhibited and need for 1:1 assistance at home to aid receptive and expressive language for targeted tasks. Son reported assisting with homework and providing recommended communication supports.   03-13-23: Targeted VNeST today to address expressive and receptive communication. Usual max A required to ID varied responses for "who" d/t reduced comprehension of targeted variations despite written cues and modeling. Occasional mod A required to ID "what" responses and answer WH-questions to expand responses. Occasional min A required to slow rate of reading to optimize speech accuracy during oral reading.  Additional education and training required with VNeST next session to optimize pt understanding and performance. Provided structured task for functional naming at home. Pt appropriately request clarification x1 when SLP provided instructions.   03-08-23: Entered with homework given instructions to ID functional words necessary to communicate at home. It appeared random words were copied that did not appear related to his daily life given pt unable to express personal connection despite written supports for comprehension. Targeted functional naming of every day pictures, in which pt able to name with 50% accuracy independently. Pt demonstrated increased awareness of errors but required usual mod to max A to correct phonemic paraphasias. Provided examples of functional naming practice to be completed at home with assistance from family members.   03-06-23: Continues to benefit from consistent written aids to optimize comprehension. For functional conversation questions with written prompt, occasional max A required to aid understanding and correct verbal paraphasias. Some rare increasing awareness of verbal errors noted today. Approximately 10-20% ability to self-correct errors when SLP assisted with identification. Cued writing was rarely effective for naming today. Recommended functional oral naming practice at home for increased ability to functional communicate wants/needs at home.   03-01-23: Reviewed HEP that was returned. Difficult to engage performance as nothing was written as pt completed independently per son. Usual mod to max A required to correct verbal paraphasias while reading sentence completions and occasional written cues required to correct dysnomia, which in part seemed related to reading comprehension. Assessed reading comprehension further via RCVA-2. Overall reading comprehension and naming grossly WNL (given words and pictures), with occasional mod cues required to aid naming at word and  sentence levels and rare mod cues required to aid comprehension of multi-sentence prompts. Greatly benefited from cued slowed rate to aid comprehension and verbal naming. No awareness of errors exhibited during oral reading, in which SLP trialed auditory recordings and visual cues with limited benefit. Given reading as relative strength, recommended family write down pertinent words for patient use at home when attempting to communicate.   02-27-23: Targeted verbal expression and reading comprehension. Functional naming of objects and pictures revealed usual anomia and neologisms. Usual max A required to ID and correct anomia/paraphasias. Given limited benefit of first letter and phonemic cues for anomia, targeted use of sentence starters to aid naming. Benefited from cued slowed rate and written cues. Intermittent mod A required to finish phrases due to phonemic paraphasias. Good reading comprehension exhibited given multiple choice of 2-3 items. Writing also benefited corrections in verbal expression.   02-24-23: Educated and instructed patient and son on clinical observations, auditory comprehension strategies, and speech error modifications. Benefited from stating patient name to aid attention, slowed speech, stressed key words, and repetition. Reduced awareness of speech errors exhibited, in which  SLP educated and instructed how to ID and correct errors. Benefited from consistent fading to intermittent visual and verbal modeling for correcting errors at word level. Updated HEP with family member education and functional naming.   02-20-23: Pt reports that he plays golf once a week on Thursdays. D/t length required to complete eval, education not initiated this date. Reading appears to be a general strength for the pt if he slows down and takes his time. Plan to assess reading comprehension further within first few sessions.   PATIENT EDUCATION: Education details: See above Person educated:  Patient Education method: Explanation, Demonstration, Verbal cues, and Handouts Education comprehension: verbalized understanding, returned demonstration, verbal cues required, and needs further education   GOALS: Goals reviewed with patient? Yes  SHORT TERM GOALS: Target date: 03/20/2023    Pt will ID and attempt to self correct paraphasias in 3/5 opportunities in a structured task across 2 sessions given mod-A.  Baseline: 03-20-23 Goal status: PARTIALLY MET  2.  Pt will use multi-modal communication to aid verbal expression in 3/5 opportunities in a structured task across 2 sessions given mod-A.  Baseline: 03-08-23 Goal status: PARTIALLY MET  3.  Pt will appropriately request repetition for auditory comprehension breakdown for 60% of opportunities during a structured task given occasional mod-A.  Baseline:  Goal status: NOT MET  4.  Pt will successfully carry over trained techniques to effectively communicate in 2 5-minute conversations given mod-A.  Baseline:  Goal status: NOT MET   LONG TERM GOALS: Target date: 04/17/2023     Pt will ID and self correct paraphasias in 50% of opportunities during a 10 minute structured conversation given occasional mod-A.  Baseline:  Goal status: IN PROGRESS  2.  Caregiver and/or pt will report perceived communicative improvement via PROM by d/c.  Baseline: 13/30 on communicative participation item bank- short form Goal status: IN PROGRESS  3.   Pt will implement communication supports PRN during conversation on personally relevant topics given min-A. Goal status: IN PROGRESS  4.  Pt will successfully implement auditory comprehension techniques to effectively communicate in 10-minute conversation given min-A.  Baseline:  Goal status: IN PROGRESS   ASSESSMENT:  CLINICAL IMPRESSION: Patient is a 89 y.o. male who was seen today for aphasia s/p CVA. Pt presents with slow but steady mild improvements in mod-severe aphasia (per QAB) c/b less  frequent phonemic and neologistic paraphasias on structured tasks, fluent but vague speech due to anomia, improving auditory comprehension, and increasing awareness of deficits. Continued education and instruction of expressive and receptive interventions and strategies to maximize functional communication. Due to ongoing severity of anomia, paraphasias, and neologisms with reduced awareness, continue to recommend increased use of communication supports (written aids) and family assistance to optimize patient communication at home and performance on targeted home tasks. Pt would benefit from skilled ST to address aformentioned deficits and to improve pt's ability to effectively communicate at home and in the community.    OBJECTIVE IMPAIRMENTS: include expressive language, receptive language, and aphasia. These impairments are limiting patient from ADLs/IADLs and effectively communicating at home and in community. Factors affecting potential to achieve goals and functional outcome are  awareness of deficits . Patient will benefit from skilled SLP services to address above impairments and improve overall function.  REHAB POTENTIAL: Good  PLAN:  SLP FREQUENCY: 2x/week  SLP DURATION: 8 weeks  PLANNED INTERVENTIONS: Language facilitation, Cueing hierachy, Multimodal communication approach, SLP instruction and feedback, Compensatory strategies, and Patient/family education    Natalia Leatherwood I  Johnson, CCC-SLP 04/05/2023, 7:55 AM

## 2023-04-05 NOTE — Patient Instructions (Addendum)
If you get stuck on a word describe it:  Where do you find it?  What do you use it for?  Can you describe it?  What does it look like?  Color  Shape What does it do?  What does it remind you of?

## 2023-04-07 ENCOUNTER — Ambulatory Visit: Payer: Medicare Other

## 2023-04-07 ENCOUNTER — Telehealth: Payer: Self-pay | Admitting: *Deleted

## 2023-04-07 DIAGNOSIS — Z0000 Encounter for general adult medical examination without abnormal findings: Secondary | ICD-10-CM | POA: Diagnosis not present

## 2023-04-07 DIAGNOSIS — R4701 Aphasia: Secondary | ICD-10-CM | POA: Diagnosis not present

## 2023-04-07 DIAGNOSIS — I480 Paroxysmal atrial fibrillation: Secondary | ICD-10-CM | POA: Diagnosis not present

## 2023-04-07 DIAGNOSIS — I69320 Aphasia following cerebral infarction: Secondary | ICD-10-CM | POA: Diagnosis not present

## 2023-04-07 DIAGNOSIS — N1831 Chronic kidney disease, stage 3a: Secondary | ICD-10-CM | POA: Diagnosis not present

## 2023-04-07 DIAGNOSIS — I509 Heart failure, unspecified: Secondary | ICD-10-CM | POA: Diagnosis not present

## 2023-04-07 DIAGNOSIS — E785 Hyperlipidemia, unspecified: Secondary | ICD-10-CM | POA: Diagnosis not present

## 2023-04-07 NOTE — Therapy (Addendum)
OUTPATIENT SPEECH LANGUAGE PATHOLOGY APHASIA TREATMENT    Patient Name: Juan [name] MRN: [id number] DOB:November 22, 1925, 89 y.o., male Today's Date: 04/07/2023  PCP: Gretta Cool REFERRING PROVIDER: Charlton Amor, PA-C   END OF SESSION:  End of Session - 04/07/23 0757     Visit Number 14    Number of Visits 17    Date for SLP Re-Evaluation 04/17/23    Authorization Type [employer] Medicare    SLP Start Time 0800    SLP Stop Time  0845    SLP Time Calculation (min) 45 min    Activity Tolerance Patient tolerated treatment well                      Past Medical History:  Diagnosis Date   Arrhythmia    Diverticulitis 2004   Hyperlipidemia    Prostate cancer (HCC)    Shingles outbreak    LEFT CHEST WALL   Past Surgical History:  Procedure Laterality Date   APPENDECTOMY     Patient Active Problem List   Diagnosis Date Noted   CVA (cerebral vascular accident) (HCC) 02/01/2023   Acute CVA (cerebrovascular accident) (HCC) 01/28/2023   Acute encephalopathy 01/27/2023   Acidosis 01/27/2023   Stage 3a chronic kidney disease (CKD) (HCC) 01/27/2023   PAF (paroxysmal atrial fibrillation) (HCC)    Hyperlipidemia     ONSET DATE: 01-27-23   REFERRING DIAG: I63.9 (ICD-10-CM) - Cerebral infarction, unspecified   THERAPY DIAG: Aphasia  Rationale for Evaluation and Treatment: Rehabilitation  SUBJECTIVE:   SUBJECTIVE STATEMENT: "doing well" Pt accompanied by: self   PERTINENT HISTORY: Juan [name] is a 89 y.o. male with past medical history of hyperlipidemia, prostate cancer and paroxysmal atrial fibrillation not on anticoagulation presented to the hospital with garbled speech, confusion and agitation.  Patient was subsequently found to CVA and admitted to the hospitalist service.   PAIN: Are you having pain? No  FALLS: Has patient fallen in last 6 months?  No  LIVING ENVIRONMENT: Lives with: lives alone (reports family member is currently helping at home) Lives  in: House/apartment  PLOF:  Level of assistance: Independent with ADLs, Independent with IADLs Employment: Retired  PATIENT GOALS: "Be able to carry on conversations" per daughter  OBJECTIVE:  TODAY'S TREATMENT:                                                                                                                                          04-06-23: Patient completed HEP of describing words with one word answers. SLP targeted additional semantic feature analysis to optimize descriptions of words to address anomia. Patient required max-A of written, semantic and phonemic cues as well as visual aids to aid comprehension and completion of task. Patient required consistent repetition to increase comprehension and carryover. Continue to target in upcoming sessions.   04-05-23: Targeted paraphasias through semantic feature analysis. Patient was  able to describe various objects with mod to max-A at 70% accuracy. Pt demonstrated improved accuracy of semantic features with gestural cues, semantic and phonemic cuing. Pt demonstrated intermittent awareness of his verbal errors and made attempts to self correct with min prompting. Models needed to increase articulation of various multi-syllable words. Auditory comprehension increased when with repetition, rephrasing and cued eye contact with/from the SLP. Patient guided through responding to questions in full sentences with visual models and cues. Patient able to read in complete sentences with 100% accuracy when provided with model.   03-29-23: Family members, Maurice March and [place], attended today's session for caregiver education. Both reported pt exhibiting improved communication effectiveness at home, including effectively talking on the phone and telling family what he wants to eat. Intermittent use of gestures or pointing reported if anomia/dysnomia occurs. Today, SLP demonstrated how to support communication with cueing hierarchy and written supports. In  functional naming task related to favorite summer activity and sports, pt demonstrated improved auditory comprehension with less frequent repetition needed and increasing error awareness with intermittent self-corrections in short structured conversations. Occasional cues provided to family members to allow patient opportunity to speak first and/or provide cues versus word. Given recent increased progress and new expectation for pt to spend some time alone in next few months, pt would benefit from additional sessions to optimize communication in real life scenarios (ex: calling for help). Provided Aphasia Card to provide in case of emergency.   03-27-23: Pt presents with improving functional communication for understanding and answering basic questions. Continues to complete reading practice of multi-syllabic words, in which pt able to read familiar 3 syllables work with ~70% accuracy and self-correct errors with ~60% accuracy. Increased awareness of errors exhibited with pt looking to therapist for visual placement cues. Pt read aloud new minimal pairs with ~80% accuracy with intermittent self-corrections. Targeted answering comprehension questions related to targeted words, in which pt able to answer accurately with ~70% accuracy with occasional self-requested repetitions. Pt required 1-2 repetitions on average to comprehend each question, with SLP utilizing slow, stressed speech and occasionally written cues (key words). Generated simple sentences with targeted words, in which pt able to complete with ~50% accuracy due to anomia and impaired sentence structured. Intermittent mod to max A required to correct.   03-23-23: Returned with multi-syllabic words (2-5 syllables) homework. Pt identified 4 words that he had trouble pronouncing. Pt able to verbalize those words with 50% accuracy independently; however, usual max A (verbal and visual) required to correct errors. Targeted verbal expression and auditory  comprehension related to functional 2-3 word syllable words. Intermittent max A required to segment syllables with choral reading and fading cues. Consistent written cues required to aid understanding of targeted questions (ex: what do you refrigerate?). Reduced comprehension and awareness of errors noted while answering personally relevant questions (birthday and phone number) requiring max A for SLP to repair communication breakdown. As pt is nearing end of scheduled visits, pt would benefit from additional ST sessions as pt beginning to progress; plan to f/u with caregiver next session if available.   03-20-23: Returned homework with mostly accurate completion. Oral reading accuracy improved with intermittent cues to slow rate. Pt able to self-correct phonemic paraphasias with increased accuracy today given extra time and cues to slow rate. Good reading comprehension exhibited with rare cues required. Updated HEP to include oral reading of multi-syllabic words. Addressed STGs with some partially met goals due to modest progress thus far given severity of deficits.  03-17-23: Continued VNeST today, with frequent max A required to aid anomia and comprehension of WH-questions. Due to language impairment, pt perseverated on same responses due to overt difficulty with word retrieval. Sentence completions, first letters, and phonemic cues were ineffective to aid naming. Pt reliant on SLP writing targeted word for verbal naming and improved comprehension of objective. Continue to recommend family using written supports at home given adequate reading comprehension. Intermittent mod to max A required to correct paraphasias at word level with limited awareness of errors today. Dicussed with son in lobby re: level of difficulty exhibited and need for 1:1 assistance at home to aid receptive and expressive language for targeted tasks. Son reported assisting with homework and providing recommended communication supports.    03-13-23: Targeted VNeST today to address expressive and receptive communication. Usual max A required to ID varied responses for "who" d/t reduced comprehension of targeted variations despite written cues and modeling. Occasional mod A required to ID "what" responses and answer WH-questions to expand responses. Occasional min A required to slow rate of reading to optimize speech accuracy during oral reading. Additional education and training required with VNeST next session to optimize pt understanding and performance. Provided structured task for functional naming at home. Pt appropriately request clarification x1 when SLP provided instructions.   03-08-23: Entered with homework given instructions to ID functional words necessary to communicate at home. It appeared random words were copied that did not appear related to his daily life given pt unable to express personal connection despite written supports for comprehension. Targeted functional naming of every day pictures, in which pt able to name with 50% accuracy independently. Pt demonstrated increased awareness of errors but required usual mod to max A to correct phonemic paraphasias. Provided examples of functional naming practice to be completed at home with assistance from family members.   03-06-23: Continues to benefit from consistent written aids to optimize comprehension. For functional conversation questions with written prompt, occasional max A required to aid understanding and correct verbal paraphasias. Some rare increasing awareness of verbal errors noted today. Approximately 10-20% ability to self-correct errors when SLP assisted with identification. Cued writing was rarely effective for naming today. Recommended functional oral naming practice at home for increased ability to functional communicate wants/needs at home.   03-01-23: Reviewed HEP that was returned. Difficult to engage performance as nothing was written as pt completed  independently per son. Usual mod to max A required to correct verbal paraphasias while reading sentence completions and occasional written cues required to correct dysnomia, which in part seemed related to reading comprehension. Assessed reading comprehension further via RCVA-2. Overall reading comprehension and naming grossly WNL (given words and pictures), with occasional mod cues required to aid naming at word and sentence levels and rare mod cues required to aid comprehension of multi-sentence prompts. Greatly benefited from cued slowed rate to aid comprehension and verbal naming. No awareness of errors exhibited during oral reading, in which SLP trialed auditory recordings and visual cues with limited benefit. Given reading as relative strength, recommended family write down pertinent words for patient use at home when attempting to communicate.   02-27-23: Targeted verbal expression and reading comprehension. Functional naming of objects and pictures revealed usual anomia and neologisms. Usual max A required to ID and correct anomia/paraphasias. Given limited benefit of first letter and phonemic cues for anomia, targeted use of sentence starters to aid naming. Benefited from cued slowed rate and written cues. Intermittent mod A required to finish phrases  due to phonemic paraphasias. Good reading comprehension exhibited given multiple choice of 2-3 items. Writing also benefited corrections in verbal expression.   02-24-23: Educated and instructed patient and son on clinical observations, auditory comprehension strategies, and speech error modifications. Benefited from stating patient name to aid attention, slowed speech, stressed key words, and repetition. Reduced awareness of speech errors exhibited, in which SLP educated and instructed how to ID and correct errors. Benefited from consistent fading to intermittent visual and verbal modeling for correcting errors at word level. Updated HEP with family member  education and functional naming.   02-20-23: Pt reports that he plays golf once a week on Thursdays. D/t length required to complete eval, education not initiated this date. Reading appears to be a general strength for the pt if he slows down and takes his time. Plan to assess reading comprehension further within first few sessions.   PATIENT EDUCATION: Education details: See above Person educated: Patient Education method: Explanation, Demonstration, Verbal cues, and Handouts Education comprehension: verbalized understanding, returned demonstration, verbal cues required, and needs further education   GOALS: Goals reviewed with patient? Yes  SHORT TERM GOALS: Target date: 03/20/2023    Pt will ID and attempt to self correct paraphasias in 3/5 opportunities in a structured task across 2 sessions given mod-A.  Baseline: 03-20-23 Goal status: PARTIALLY MET  2.  Pt will use multi-modal communication to aid verbal expression in 3/5 opportunities in a structured task across 2 sessions given mod-A.  Baseline: 03-08-23 Goal status: PARTIALLY MET  3.  Pt will appropriately request repetition for auditory comprehension breakdown for 60% of opportunities during a structured task given occasional mod-A.  Baseline:  Goal status: NOT MET  4.  Pt will successfully carry over trained techniques to effectively communicate in 2 5-minute conversations given mod-A.  Baseline:  Goal status: NOT MET   LONG TERM GOALS: Target date: 04/17/2023     Pt will ID and self correct paraphasias in 50% of opportunities during a 10 minute structured conversation given occasional mod-A.  Baseline:  Goal status: IN PROGRESS  2.  Caregiver and/or pt will report perceived communicative improvement via PROM by d/c.  Baseline: 13/30 on communicative participation item bank- short form Goal status: IN PROGRESS  3.   Pt will implement communication supports PRN during conversation on personally relevant topics given  min-A. Goal status: IN PROGRESS  4.  Pt will successfully implement auditory comprehension techniques to effectively communicate in 10-minute conversation given min-A.  Baseline:  Goal status: IN PROGRESS   ASSESSMENT:  CLINICAL IMPRESSION: Patient is a 89 y.o. male who was seen today for aphasia s/p CVA. Pt presents with slow but steady mild improvements in mod-severe aphasia (per QAB) c/b less frequent phonemic and neologistic paraphasias on structured tasks, fluent but vague speech due to anomia, improving auditory comprehension, and increasing awareness of deficits. Continued education and instruction of expressive and receptive interventions and strategies to maximize functional communication. Due to ongoing severity of anomia, paraphasias, and neologisms with reduced awareness, continue to recommend increased use of communication supports (written aids) and family assistance to optimize patient communication at home and performance on targeted home tasks. Pt would benefit from skilled ST to address aformentioned deficits and to improve pt's ability to effectively communicate at home and in the community.    OBJECTIVE IMPAIRMENTS: include expressive language, receptive language, and aphasia. These impairments are limiting patient from ADLs/IADLs and effectively communicating at home and in community. Factors affecting potential to achieve goals  and functional outcome are  awareness of deficits . Patient will benefit from skilled SLP services to address above impairments and improve overall function.  REHAB POTENTIAL: Good  PLAN:  SLP FREQUENCY: 2x/week  SLP DURATION: 8 weeks  PLANNED INTERVENTIONS: Language facilitation, Cueing hierachy, Multimodal communication approach, SLP instruction and feedback, Compensatory strategies, and Patient/family education    [place], Student-SLP 04/07/2023, 7:59 AM

## 2023-04-07 NOTE — Telephone Encounter (Signed)
Juan [name] (Key: [id number]) Rx #: [phone]

## 2023-04-10 ENCOUNTER — Ambulatory Visit: Payer: Medicare Other | Attending: Physician Assistant

## 2023-04-10 DIAGNOSIS — R4701 Aphasia: Secondary | ICD-10-CM | POA: Diagnosis not present

## 2023-04-10 NOTE — Therapy (Signed)
OUTPATIENT SPEECH LANGUAGE PATHOLOGY APHASIA TREATMENT    Patient Name: Juan [name] MRN: [id number] DOB:October 01, 1926, 89 y.o., male Today's Date: 04/10/2023  PCP: Gretta Cool REFERRING PROVIDER: Charlton Amor, PA-C   END OF SESSION:  End of Session - 04/10/23 0753     Visit Number 15    Number of Visits 17    Date for SLP Re-Evaluation 04/17/23    Authorization Type [employer] Medicare    SLP Start Time 0800    SLP Stop Time  0844    SLP Time Calculation (min) 44 min    Activity Tolerance Patient tolerated treatment well                      Past Medical History:  Diagnosis Date   Arrhythmia    Diverticulitis 2004   Hyperlipidemia    Prostate cancer (HCC)    Shingles outbreak    LEFT CHEST WALL   Past Surgical History:  Procedure Laterality Date   APPENDECTOMY     Patient Active Problem List   Diagnosis Date Noted   CVA (cerebral vascular accident) (HCC) 02/01/2023   Acute CVA (cerebrovascular accident) (HCC) 01/28/2023   Acute encephalopathy 01/27/2023   Acidosis 01/27/2023   Stage 3a chronic kidney disease (CKD) (HCC) 01/27/2023   PAF (paroxysmal atrial fibrillation) (HCC)    Hyperlipidemia     ONSET DATE: 01-27-23   REFERRING DIAG: I63.9 (ICD-10-CM) - Cerebral infarction, unspecified   THERAPY DIAG: Aphasia  Rationale for Evaluation and Treatment: Rehabilitation  SUBJECTIVE:   SUBJECTIVE STATEMENT: "went to the poor - I mean pool" Pt accompanied by: self   PERTINENT HISTORY: Juan [name] is a 89 y.o. male with past medical history of hyperlipidemia, prostate cancer and paroxysmal atrial fibrillation not on anticoagulation presented to the hospital with garbled speech, confusion and agitation.  Patient was subsequently found to CVA and admitted to the hospitalist service.   PAIN: Are you having pain? No  FALLS: Has patient fallen in last 6 months?  No  LIVING ENVIRONMENT: Lives with: lives alone (reports family member is currently  helping at home) Lives in: House/apartment  PLOF:  Level of assistance: Independent with ADLs, Independent with IADLs Employment: Retired  PATIENT GOALS: "Be able to carry on conversations" per daughter  OBJECTIVE:  TODAY'S TREATMENT:                                                                                                                                          04-10-23: Pt entered with repeat description HEP completed with slightly more thorough descriptions. Performance appears to be impacted by anomia and impaired written expression. Targeted convergent naming today, with SLP presenting verbal description to address anomia and auditory comprehension. Pt able to independently answer with 65% accuracy with self-requested repetition 1-3x for more than half of items. Able to intermittently correct phonemic paraphasias with min prompting.  Continues to benefit most from written cues versus semantic or sentence starters. Overall improving awareness of deficits exhibited, as pt endorsed spelling as area of difficulty. Provided spelling HEP task.  04-06-23: Patient completed HEP of describing words with one word answers. SLP targeted additional semantic feature analysis to optimize descriptions of words to address anomia. Patient required max-A of written, semantic and phonemic cues as well as visual aids to aid comprehension and completion of task. Patient required consistent repetition to increase comprehension and carryover. Continue to target in upcoming sessions.   04-05-23: Targeted paraphasias through semantic feature analysis. Patient was able to describe various objects with mod to max-A at 70% accuracy. Pt demonstrated improved accuracy of semantic features with gestural cues, semantic and phonemic cuing. Pt demonstrated intermittent awareness of his verbal errors and made attempts to self correct with min prompting. Models needed to increase articulation of various multi-syllable words.  Auditory comprehension increased when with repetition, rephrasing and cued eye contact with/from the SLP. Patient guided through responding to questions in full sentences with visual models and cues. Patient able to read in complete sentences with 100% accuracy when provided with model.   03-29-23: Family members, Maurice March and [place], attended today's session for caregiver education. Both reported pt exhibiting improved communication effectiveness at home, including effectively talking on the phone and telling family what he wants to eat. Intermittent use of gestures or pointing reported if anomia/dysnomia occurs. Today, SLP demonstrated how to support communication with cueing hierarchy and written supports. In functional naming task related to favorite summer activity and sports, pt demonstrated improved auditory comprehension with less frequent repetition needed and increasing error awareness with intermittent self-corrections in short structured conversations. Occasional cues provided to family members to allow patient opportunity to speak first and/or provide cues versus word. Given recent increased progress and new expectation for pt to spend some time alone in next few months, pt would benefit from additional sessions to optimize communication in real life scenarios (ex: calling for help). Provided Aphasia Card to provide in case of emergency.   03-27-23: Pt presents with improving functional communication for understanding and answering basic questions. Continues to complete reading practice of multi-syllabic words, in which pt able to read familiar 3 syllables work with ~70% accuracy and self-correct errors with ~60% accuracy. Increased awareness of errors exhibited with pt looking to therapist for visual placement cues. Pt read aloud new minimal pairs with ~80% accuracy with intermittent self-corrections. Targeted answering comprehension questions related to targeted words, in which pt able to answer  accurately with ~70% accuracy with occasional self-requested repetitions. Pt required 1-2 repetitions on average to comprehend each question, with SLP utilizing slow, stressed speech and occasionally written cues (key words). Generated simple sentences with targeted words, in which pt able to complete with ~50% accuracy due to anomia and impaired sentence structured. Intermittent mod to max A required to correct.   03-23-23: Returned with multi-syllabic words (2-5 syllables) homework. Pt identified 4 words that he had trouble pronouncing. Pt able to verbalize those words with 50% accuracy independently; however, usual max A (verbal and visual) required to correct errors. Targeted verbal expression and auditory comprehension related to functional 2-3 word syllable words. Intermittent max A required to segment syllables with choral reading and fading cues. Consistent written cues required to aid understanding of targeted questions (ex: what do you refrigerate?). Reduced comprehension and awareness of errors noted while answering personally relevant questions (birthday and phone number) requiring max A for SLP to repair communication  breakdown. As pt is nearing end of scheduled visits, pt would benefit from additional ST sessions as pt beginning to progress; plan to f/u with caregiver next session if available.   03-20-23: Returned homework with mostly accurate completion. Oral reading accuracy improved with intermittent cues to slow rate. Pt able to self-correct phonemic paraphasias with increased accuracy today given extra time and cues to slow rate. Good reading comprehension exhibited with rare cues required. Updated HEP to include oral reading of multi-syllabic words. Addressed STGs with some partially met goals due to modest progress thus far given severity of deficits.   03-17-23: Continued VNeST today, with frequent max A required to aid anomia and comprehension of WH-questions. Due to language impairment,  pt perseverated on same responses due to overt difficulty with word retrieval. Sentence completions, first letters, and phonemic cues were ineffective to aid naming. Pt reliant on SLP writing targeted word for verbal naming and improved comprehension of objective. Continue to recommend family using written supports at home given adequate reading comprehension. Intermittent mod to max A required to correct paraphasias at word level with limited awareness of errors today. Dicussed with son in lobby re: level of difficulty exhibited and need for 1:1 assistance at home to aid receptive and expressive language for targeted tasks. Son reported assisting with homework and providing recommended communication supports.   03-13-23: Targeted VNeST today to address expressive and receptive communication. Usual max A required to ID varied responses for "who" d/t reduced comprehension of targeted variations despite written cues and modeling. Occasional mod A required to ID "what" responses and answer WH-questions to expand responses. Occasional min A required to slow rate of reading to optimize speech accuracy during oral reading. Additional education and training required with VNeST next session to optimize pt understanding and performance. Provided structured task for functional naming at home. Pt appropriately request clarification x1 when SLP provided instructions.   03-08-23: Entered with homework given instructions to ID functional words necessary to communicate at home. It appeared random words were copied that did not appear related to his daily life given pt unable to express personal connection despite written supports for comprehension. Targeted functional naming of every day pictures, in which pt able to name with 50% accuracy independently. Pt demonstrated increased awareness of errors but required usual mod to max A to correct phonemic paraphasias. Provided examples of functional naming practice to be completed at  home with assistance from family members.     PATIENT EDUCATION: Education details: See above Person educated: Patient Education method: Explanation, Demonstration, Verbal cues, and Handouts Education comprehension: verbalized understanding, returned demonstration, verbal cues required, and needs further education   GOALS: Goals reviewed with patient? Yes  SHORT TERM GOALS: Target date: 03/20/2023    Pt will ID and attempt to self correct paraphasias in 3/5 opportunities in a structured task across 2 sessions given mod-A.  Baseline: 03-20-23 Goal status: PARTIALLY MET  2.  Pt will use multi-modal communication to aid verbal expression in 3/5 opportunities in a structured task across 2 sessions given mod-A.  Baseline: 03-08-23 Goal status: PARTIALLY MET  3.  Pt will appropriately request repetition for auditory comprehension breakdown for 60% of opportunities during a structured task given occasional mod-A.  Baseline:  Goal status: NOT MET  4.  Pt will successfully carry over trained techniques to effectively communicate in 2 5-minute conversations given mod-A.  Baseline:  Goal status: NOT MET   LONG TERM GOALS: Target date: 04/17/2023     Pt  will ID and self correct paraphasias in 50% of opportunities during a 10 minute structured conversation given occasional mod-A.  Baseline:  Goal status: IN PROGRESS  2.  Caregiver and/or pt will report perceived communicative improvement via PROM by d/c.  Baseline: 13/30 on communicative participation item bank- short form Goal status: IN PROGRESS  3.   Pt will implement communication supports PRN during conversation on personally relevant topics given min-A. Baseline:  Goal status: IN PROGRESS  4.  Pt will successfully implement auditory comprehension techniques to effectively communicate in 10-minute conversation given min-A.  Baseline:  Goal status: IN PROGRESS   ASSESSMENT:  CLINICAL IMPRESSION: Patient is a 89 y.o. male who  was seen today for aphasia s/p CVA. Pt presents with slow but steady mild improvements in mod-severe aphasia (per QAB) c/b less frequent phonemic and neologistic paraphasias on structured tasks, fluent but vague speech due to anomia, improving auditory comprehension, and increasing awareness of deficits. Continued education and instruction of expressive and receptive interventions and strategies to maximize functional communication. Pt would benefit from skilled ST to address aformentioned deficits and to improve pt's ability to effectively communicate at home and in the community.    OBJECTIVE IMPAIRMENTS: include expressive language, receptive language, and aphasia. These impairments are limiting patient from ADLs/IADLs and effectively communicating at home and in community. Factors affecting potential to achieve goals and functional outcome are  awareness of deficits . Patient will benefit from skilled SLP services to address above impairments and improve overall function.  REHAB POTENTIAL: Good  PLAN:  SLP FREQUENCY: 2x/week  SLP DURATION: 8 weeks  PLANNED INTERVENTIONS: Language facilitation, Cueing hierachy, Multimodal communication approach, SLP instruction and feedback, Compensatory strategies, and Patient/family education    Gracy Racer, CCC-SLP 04/10/2023, 7:54 AM

## 2023-04-12 ENCOUNTER — Ambulatory Visit: Payer: Medicare Other

## 2023-04-12 DIAGNOSIS — R4701 Aphasia: Secondary | ICD-10-CM | POA: Diagnosis not present

## 2023-04-12 NOTE — Therapy (Signed)
OUTPATIENT SPEECH LANGUAGE PATHOLOGY APHASIA TREATMENT    Patient Name: Juan [name] MRN: [id number] DOB:Jan 09, 1926, 89 y.o., male Today's Date: 04/12/2023  PCP: Gretta Cool REFERRING PROVIDER: Charlton Amor, PA-C   END OF SESSION:  End of Session - 04/12/23 0800     Visit Number 16    Number of Visits 17    Date for SLP Re-Evaluation 04/17/23    Authorization Type [employer] Medicare    SLP Start Time 0800    SLP Stop Time  0845    SLP Time Calculation (min) 45 min    Activity Tolerance Patient tolerated treatment well                Past Medical History:  Diagnosis Date   Arrhythmia    Diverticulitis 2004   Hyperlipidemia    Prostate cancer (HCC)    Shingles outbreak    LEFT CHEST WALL   Past Surgical History:  Procedure Laterality Date   APPENDECTOMY     Patient Active Problem List   Diagnosis Date Noted   CVA (cerebral vascular accident) (HCC) 02/01/2023   Acute CVA (cerebrovascular accident) (HCC) 01/28/2023   Acute encephalopathy 01/27/2023   Acidosis 01/27/2023   Stage 3a chronic kidney disease (CKD) (HCC) 01/27/2023   PAF (paroxysmal atrial fibrillation) (HCC)    Hyperlipidemia     ONSET DATE: 01-27-23   REFERRING DIAG: I63.9 (ICD-10-CM) - Cerebral infarction, unspecified   THERAPY DIAG: Aphasia  Rationale for Evaluation and Treatment: Rehabilitation  SUBJECTIVE:   SUBJECTIVE STATEMENT: "doing well, hope you are." Pt accompanied by: self   PERTINENT HISTORY: Juan [name] is a 89 y.o. male with past medical history of hyperlipidemia, prostate cancer and paroxysmal atrial fibrillation not on anticoagulation presented to the hospital with garbled speech, confusion and agitation.  Patient was subsequently found to CVA and admitted to the hospitalist service.   PAIN: Are you having pain? No  FALLS: Has patient fallen in last 6 months?  No  LIVING ENVIRONMENT: Lives with: lives alone (reports family member is currently helping at  home) Lives in: House/apartment  PLOF:  Level of assistance: Independent with ADLs, Independent with IADLs Employment: Retired  PATIENT GOALS: "Be able to carry on conversations" per daughter  OBJECTIVE:  TODAY'S TREATMENT:                                                                                                                                          04-12-23: Completing fill in the blank from HEP. Patient was able to complete some homework with some mistakes. States he had use a dictionary to find some words. Pt required max-A of semantic, visual cues to find words, as well as reminders to slow down for self-corrections while writing. Patient was able to make verbal and written corrections with reminders to look at SLP for clarification. Led through word recall from descriptions. Patient was able  to find words from descriptors with intermittent mod/max-A of repetitions and reminders to slow down. HEP to continue work on filling in the blank for words within categories.   04-10-23: Pt entered with repeat description HEP completed with slightly more thorough descriptions. Performance appears to be impacted by anomia and impaired written expression. Targeted convergent naming today, with SLP presenting verbal description to address anomia and auditory comprehension. Pt able to independently answer with 65% accuracy with self-requested repetition 1-3x for more than half of items. Able to intermittently correct phonemic paraphasias with min prompting. Continues to benefit most from written cues versus semantic or sentence starters. Overall improving awareness of deficits exhibited, as pt endorsed spelling as area of difficulty. Provided spelling HEP task.  04-06-23: Patient completed HEP of describing words with one word answers. SLP targeted additional semantic feature analysis to optimize descriptions of words to address anomia. Patient required max-A of written, semantic and phonemic cues as well  as visual aids to aid comprehension and completion of task. Patient required consistent repetition to increase comprehension and carryover. Continue to target in upcoming sessions.   04-05-23: Targeted paraphasias through semantic feature analysis. Patient was able to describe various objects with mod to max-A at 70% accuracy. Pt demonstrated improved accuracy of semantic features with gestural cues, semantic and phonemic cuing. Pt demonstrated intermittent awareness of his verbal errors and made attempts to self correct with min prompting. Models needed to increase articulation of various multi-syllable words. Auditory comprehension increased when with repetition, rephrasing and cued eye contact with/from the SLP. Patient guided through responding to questions in full sentences with visual models and cues. Patient able to read in complete sentences with 100% accuracy when provided with model.   03-29-23: Family members, Maurice March and [place], attended today's session for caregiver education. Both reported pt exhibiting improved communication effectiveness at home, including effectively talking on the phone and telling family what he wants to eat. Intermittent use of gestures or pointing reported if anomia/dysnomia occurs. Today, SLP demonstrated how to support communication with cueing hierarchy and written supports. In functional naming task related to favorite summer activity and sports, pt demonstrated improved auditory comprehension with less frequent repetition needed and increasing error awareness with intermittent self-corrections in short structured conversations. Occasional cues provided to family members to allow patient opportunity to speak first and/or provide cues versus word. Given recent increased progress and new expectation for pt to spend some time alone in next few months, pt would benefit from additional sessions to optimize communication in real life scenarios (ex: calling for help). Provided  Aphasia Card to provide in case of emergency.   03-27-23: Pt presents with improving functional communication for understanding and answering basic questions. Continues to complete reading practice of multi-syllabic words, in which pt able to read familiar 3 syllables work with ~70% accuracy and self-correct errors with ~60% accuracy. Increased awareness of errors exhibited with pt looking to therapist for visual placement cues. Pt read aloud new minimal pairs with ~80% accuracy with intermittent self-corrections. Targeted answering comprehension questions related to targeted words, in which pt able to answer accurately with ~70% accuracy with occasional self-requested repetitions. Pt required 1-2 repetitions on average to comprehend each question, with SLP utilizing slow, stressed speech and occasionally written cues (key words). Generated simple sentences with targeted words, in which pt able to complete with ~50% accuracy due to anomia and impaired sentence structured. Intermittent mod to max A required to correct.   03-23-23: Returned with multi-syllabic words (2-5 syllables)  homework. Pt identified 4 words that he had trouble pronouncing. Pt able to verbalize those words with 50% accuracy independently; however, usual max A (verbal and visual) required to correct errors. Targeted verbal expression and auditory comprehension related to functional 2-3 word syllable words. Intermittent max A required to segment syllables with choral reading and fading cues. Consistent written cues required to aid understanding of targeted questions (ex: what do you refrigerate?). Reduced comprehension and awareness of errors noted while answering personally relevant questions (birthday and phone number) requiring max A for SLP to repair communication breakdown. As pt is nearing end of scheduled visits, pt would benefit from additional ST sessions as pt beginning to progress; plan to f/u with caregiver next session if available.    03-20-23: Returned homework with mostly accurate completion. Oral reading accuracy improved with intermittent cues to slow rate. Pt able to self-correct phonemic paraphasias with increased accuracy today given extra time and cues to slow rate. Good reading comprehension exhibited with rare cues required. Updated HEP to include oral reading of multi-syllabic words. Addressed STGs with some partially met goals due to modest progress thus far given severity of deficits.   03-17-23: Continued VNeST today, with frequent max A required to aid anomia and comprehension of WH-questions. Due to language impairment, pt perseverated on same responses due to overt difficulty with word retrieval. Sentence completions, first letters, and phonemic cues were ineffective to aid naming. Pt reliant on SLP writing targeted word for verbal naming and improved comprehension of objective. Continue to recommend family using written supports at home given adequate reading comprehension. Intermittent mod to max A required to correct paraphasias at word level with limited awareness of errors today. Dicussed with son in lobby re: level of difficulty exhibited and need for 1:1 assistance at home to aid receptive and expressive language for targeted tasks. Son reported assisting with homework and providing recommended communication supports.   03-13-23: Targeted VNeST today to address expressive and receptive communication. Usual max A required to ID varied responses for "who" d/t reduced comprehension of targeted variations despite written cues and modeling. Occasional mod A required to ID "what" responses and answer WH-questions to expand responses. Occasional min A required to slow rate of reading to optimize speech accuracy during oral reading. Additional education and training required with VNeST next session to optimize pt understanding and performance. Provided structured task for functional naming at home. Pt appropriately request  clarification x1 when SLP provided instructions.   03-08-23: Entered with homework given instructions to ID functional words necessary to communicate at home. It appeared random words were copied that did not appear related to his daily life given pt unable to express personal connection despite written supports for comprehension. Targeted functional naming of every day pictures, in which pt able to name with 50% accuracy independently. Pt demonstrated increased awareness of errors but required usual mod to max A to correct phonemic paraphasias. Provided examples of functional naming practice to be completed at home with assistance from family members.     PATIENT EDUCATION: Education details: See above Person educated: Patient Education method: Explanation, Demonstration, Verbal cues, and Handouts Education comprehension: verbalized understanding, returned demonstration, verbal cues required, and needs further education   GOALS: Goals reviewed with patient? Yes  SHORT TERM GOALS: Target date: 03/20/2023    Pt will ID and attempt to self correct paraphasias in 3/5 opportunities in a structured task across 2 sessions given mod-A.  Baseline: 03-20-23 Goal status: PARTIALLY MET  2.  Pt  will use multi-modal communication to aid verbal expression in 3/5 opportunities in a structured task across 2 sessions given mod-A.  Baseline: 03-08-23 Goal status: PARTIALLY MET  3.  Pt will appropriately request repetition for auditory comprehension breakdown for 60% of opportunities during a structured task given occasional mod-A.  Baseline:  Goal status: NOT MET  4.  Pt will successfully carry over trained techniques to effectively communicate in 2 5-minute conversations given mod-A.  Baseline:  Goal status: NOT MET   LONG TERM GOALS: Target date: 04/17/2023     Pt will ID and self correct paraphasias in 50% of opportunities during a 10 minute structured conversation given occasional mod-A.  Baseline:   Goal status: IN PROGRESS  2.  Caregiver and/or pt will report perceived communicative improvement via PROM by d/c.  Baseline: 13/30 on communicative participation item bank- short form Goal status: IN PROGRESS  3.   Pt will implement communication supports PRN during conversation on personally relevant topics given min-A. Baseline:  Goal status: IN PROGRESS  4.  Pt will successfully implement auditory comprehension techniques to effectively communicate in 10-minute conversation given min-A.  Baseline:  Goal status: IN PROGRESS   ASSESSMENT:  CLINICAL IMPRESSION: Patient is a 89 y.o. male who was seen today for aphasia s/p CVA. Pt presents with slow but steady mild improvements in mod-severe aphasia (per QAB) c/b less frequent phonemic and neologistic paraphasias on structured tasks, fluent but vague speech due to anomia, improving auditory comprehension, and increasing awareness of deficits. Continued education and instruction of expressive and receptive interventions and strategies to maximize functional communication. Pt would benefit from skilled ST to address aformentioned deficits and to improve pt's ability to effectively communicate at home and in the community.    OBJECTIVE IMPAIRMENTS: include expressive language, receptive language, and aphasia. These impairments are limiting patient from ADLs/IADLs and effectively communicating at home and in community. Factors affecting potential to achieve goals and functional outcome are  awareness of deficits . Patient will benefit from skilled SLP services to address above impairments and improve overall function.  REHAB POTENTIAL: Good  PLAN:  SLP FREQUENCY: 2x/week  SLP DURATION: 8 weeks  PLANNED INTERVENTIONS: Language facilitation, Cueing hierachy, Multimodal communication approach, SLP instruction and feedback, Compensatory strategies, and Patient/family education    Gracy Racer, CCC-SLP 04/12/2023, 10:33 AM

## 2023-04-13 NOTE — Telephone Encounter (Signed)
Glendon Gravois (Key: [id number]) [phone] QUEtiapine Fumarate 25MG  tablets Status: PA Response - ApprovedCreated: May 29th, 2024 [phone]: June 5th, 2024

## 2023-04-13 NOTE — Telephone Encounter (Signed)
Pharmacy notified.

## 2023-04-17 ENCOUNTER — Ambulatory Visit: Payer: Medicare Other

## 2023-04-17 DIAGNOSIS — R4701 Aphasia: Secondary | ICD-10-CM | POA: Diagnosis not present

## 2023-04-17 NOTE — Therapy (Signed)
OUTPATIENT SPEECH LANGUAGE PATHOLOGY APHASIA TREATMENT (RECERT)   Patient Name: Juan [name] MRN: [id number] DOB:06-29-1926, 89 y.o., male Today's Date: 04/17/2023  PCP: Gretta Cool REFERRING PROVIDER: Charlton Amor, PA-C   END OF SESSION:  End of Session - 04/17/23 0752     Visit Number 17    Number of Visits 29    Date for SLP Re-Evaluation 05/29/23    Authorization Type [employer] Medicare    SLP Start Time 0755    SLP Stop Time  0840    SLP Time Calculation (min) 45 min    Activity Tolerance Patient tolerated treatment well                 Past Medical History:  Diagnosis Date   Arrhythmia    Diverticulitis 2004   Hyperlipidemia    Prostate cancer (HCC)    Shingles outbreak    LEFT CHEST WALL   Past Surgical History:  Procedure Laterality Date   APPENDECTOMY     Patient Active Problem List   Diagnosis Date Noted   CVA (cerebral vascular accident) (HCC) 02/01/2023   Acute CVA (cerebrovascular accident) (HCC) 01/28/2023   Acute encephalopathy 01/27/2023   Acidosis 01/27/2023   Stage 3a chronic kidney disease (CKD) (HCC) 01/27/2023   PAF (paroxysmal atrial fibrillation) (HCC)    Hyperlipidemia     ONSET DATE: 01-27-23   REFERRING DIAG: I63.9 (ICD-10-CM) - Cerebral infarction, unspecified   THERAPY DIAG: Aphasia  Rationale for Evaluation and Treatment: Rehabilitation  SUBJECTIVE:   SUBJECTIVE STATEMENT: "doing well. Hope you are" Pt accompanied by: self   PERTINENT HISTORY: Juan [name] is a 89 y.o. male with past medical history of hyperlipidemia, prostate cancer and paroxysmal atrial fibrillation not on anticoagulation presented to the hospital with garbled speech, confusion and agitation.  Patient was subsequently found to CVA and admitted to the hospitalist service.   PAIN: Are you having pain? No  FALLS: Has patient fallen in last 6 months?  No  LIVING ENVIRONMENT: Lives with: lives alone (reports family member is currently helping at  home) Lives in: House/apartment  PLOF:  Level of assistance: Independent with ADLs, Independent with IADLs Employment: Retired  PATIENT GOALS: "Be able to carry on conversations" per daughter  OBJECTIVE:  TODAY'S TREATMENT:                                                                                                                                          04-17-23: Continued to address writing today with pt returning with spelling HEP. Occasional errors noted at word level requiring intermittent mod to max A to ID and correct. Targeted functional naming and writing, in which pt able to name with 66% accuracy and write with 33% accuracy requiring mod to max A to correct. Practice functional naming, including daughter's address and phone number. Usual anomia and perseverations noted, which improved with written cues and  mod verbal/visual cues. Re-certification completed today with pt making slow but steady improvements in communication effectiveness.   04-12-23: Completing fill in the blank from HEP. Patient was able to complete some homework with some mistakes. States he had use a dictionary to find some words. Pt required max-A of semantic, visual cues to find words, as well as reminders to slow down for self-corrections while writing. Patient was able to make verbal and written corrections with reminders to look at SLP for clarification. Led through word recall from descriptions. Patient was able to find words from descriptors with intermittent mod/max-A of repetitions and reminders to slow down. HEP to continue work on filling in the blank for words within categories.   04-10-23: Pt entered with repeat description HEP completed with slightly more thorough descriptions. Performance appears to be impacted by anomia and impaired written expression. Targeted convergent naming today, with SLP presenting verbal description to address anomia and auditory comprehension. Pt able to independently answer with  65% accuracy with self-requested repetition 1-3x for more than half of items. Able to intermittently correct phonemic paraphasias with min prompting. Continues to benefit most from written cues versus semantic or sentence starters. Overall improving awareness of deficits exhibited, as pt endorsed spelling as area of difficulty. Provided spelling HEP task.  04-06-23: Patient completed HEP of describing words with one word answers. SLP targeted additional semantic feature analysis to optimize descriptions of words to address anomia. Patient required max-A of written, semantic and phonemic cues as well as visual aids to aid comprehension and completion of task. Patient required consistent repetition to increase comprehension and carryover. Continue to target in upcoming sessions.   04-05-23: Targeted paraphasias through semantic feature analysis. Patient was able to describe various objects with mod to max-A at 70% accuracy. Pt demonstrated improved accuracy of semantic features with gestural cues, semantic and phonemic cuing. Pt demonstrated intermittent awareness of his verbal errors and made attempts to self correct with min prompting. Models needed to increase articulation of various multi-syllable words. Auditory comprehension increased when with repetition, rephrasing and cued eye contact with/from the SLP. Patient guided through responding to questions in full sentences with visual models and cues. Patient able to read in complete sentences with 100% accuracy when provided with model.   03-29-23: Family members, Maurice March and [place], attended today's session for caregiver education. Both reported pt exhibiting improved communication effectiveness at home, including effectively talking on the phone and telling family what he wants to eat. Intermittent use of gestures or pointing reported if anomia/dysnomia occurs. Today, SLP demonstrated how to support communication with cueing hierarchy and written supports. In  functional naming task related to favorite summer activity and sports, pt demonstrated improved auditory comprehension with less frequent repetition needed and increasing error awareness with intermittent self-corrections in short structured conversations. Occasional cues provided to family members to allow patient opportunity to speak first and/or provide cues versus word. Given recent increased progress and new expectation for pt to spend some time alone in next few months, pt would benefit from additional sessions to optimize communication in real life scenarios (ex: calling for help). Provided Aphasia Card to provide in case of emergency.   03-27-23: Pt presents with improving functional communication for understanding and answering basic questions. Continues to complete reading practice of multi-syllabic words, in which pt able to read familiar 3 syllables work with ~70% accuracy and self-correct errors with ~60% accuracy. Increased awareness of errors exhibited with pt looking to therapist for visual placement cues. Pt read  aloud new minimal pairs with ~80% accuracy with intermittent self-corrections. Targeted answering comprehension questions related to targeted words, in which pt able to answer accurately with ~70% accuracy with occasional self-requested repetitions. Pt required 1-2 repetitions on average to comprehend each question, with SLP utilizing slow, stressed speech and occasionally written cues (key words). Generated simple sentences with targeted words, in which pt able to complete with ~50% accuracy due to anomia and impaired sentence structured. Intermittent mod to max A required to correct.   03-23-23: Returned with multi-syllabic words (2-5 syllables) homework. Pt identified 4 words that he had trouble pronouncing. Pt able to verbalize those words with 50% accuracy independently; however, usual max A (verbal and visual) required to correct errors. Targeted verbal expression and auditory  comprehension related to functional 2-3 word syllable words. Intermittent max A required to segment syllables with choral reading and fading cues. Consistent written cues required to aid understanding of targeted questions (ex: what do you refrigerate?). Reduced comprehension and awareness of errors noted while answering personally relevant questions (birthday and phone number) requiring max A for SLP to repair communication breakdown. As pt is nearing end of scheduled visits, pt would benefit from additional ST sessions as pt beginning to progress; plan to f/u with caregiver next session if available.   03-20-23: Returned homework with mostly accurate completion. Oral reading accuracy improved with intermittent cues to slow rate. Pt able to self-correct phonemic paraphasias with increased accuracy today given extra time and cues to slow rate. Good reading comprehension exhibited with rare cues required. Updated HEP to include oral reading of multi-syllabic words. Addressed STGs with some partially met goals due to modest progress thus far given severity of deficits.   03-17-23: Continued VNeST today, with frequent max A required to aid anomia and comprehension of WH-questions. Due to language impairment, pt perseverated on same responses due to overt difficulty with word retrieval. Sentence completions, first letters, and phonemic cues were ineffective to aid naming. Pt reliant on SLP writing targeted word for verbal naming and improved comprehension of objective. Continue to recommend family using written supports at home given adequate reading comprehension. Intermittent mod to max A required to correct paraphasias at word level with limited awareness of errors today. Dicussed with son in lobby re: level of difficulty exhibited and need for 1:1 assistance at home to aid receptive and expressive language for targeted tasks. Son reported assisting with homework and providing recommended communication supports.    03-13-23: Targeted VNeST today to address expressive and receptive communication. Usual max A required to ID varied responses for "who" d/t reduced comprehension of targeted variations despite written cues and modeling. Occasional mod A required to ID "what" responses and answer WH-questions to expand responses. Occasional min A required to slow rate of reading to optimize speech accuracy during oral reading. Additional education and training required with VNeST next session to optimize pt understanding and performance. Provided structured task for functional naming at home. Pt appropriately request clarification x1 when SLP provided instructions.   03-08-23: Entered with homework given instructions to ID functional words necessary to communicate at home. It appeared random words were copied that did not appear related to his daily life given pt unable to express personal connection despite written supports for comprehension. Targeted functional naming of every day pictures, in which pt able to name with 50% accuracy independently. Pt demonstrated increased awareness of errors but required usual mod to max A to correct phonemic paraphasias. Provided examples of functional naming practice to  be completed at home with assistance from family members.     PATIENT EDUCATION: Education details: See above Person educated: Patient Education method: Explanation, Demonstration, Verbal cues, and Handouts Education comprehension: verbalized understanding, returned demonstration, verbal cues required, and needs further education   GOALS: Goals reviewed with patient? Yes  SHORT TERM GOALS: Target date: 03/20/2023    Pt will ID and attempt to self correct paraphasias in 3/5 opportunities in a structured task across 2 sessions given mod-A.  Baseline: 03-20-23 Goal status: PARTIALLY MET  2.  Pt will use multi-modal communication to aid verbal expression in 3/5 opportunities in a structured task across 2 sessions  given mod-A.  Baseline: 03-08-23 Goal status: PARTIALLY MET  3.  Pt will appropriately request repetition for auditory comprehension breakdown for 60% of opportunities during a structured task given occasional mod-A.  Baseline:  Goal status: NOT MET  4.  Pt will successfully carry over trained techniques to effectively communicate in 2 5-minute conversations given mod-A.  Baseline:  Goal status: NOT MET   LONG TERM GOALS: Target date: 04/17/2023 (05/29/2023 - recert)    Pt will ID and self correct paraphasias in 50% of opportunities during a 5 minute structured conversation given occasional mod-A.  Baseline:  Goal status: IN PROGRESS (downgraded at recert)  2.  Caregiver and/or pt will report perceived communicative improvement via PROM by d/c.  Baseline: 13/30 on communicative participation item bank- short form Goal status: IN PROGRESS (ongoing at recert)  3.   Pt will implement communication supports PRN during conversation on personally relevant topics given mod-A. Baseline:  Goal status: IN PROGRESS (downgraded at recert)  4.  Pt will successfully implement auditory comprehension techniques to effectively communicate in 5-minute conversation given min-A.  Baseline:  Goal status: IN PROGRESS (downgraded at recert)   ASSESSMENT:  CLINICAL IMPRESSION: Patient is a 89 y.o. male who was seen today for aphasia s/p CVA. Pt presents with slow but steady mild improvements in mod-severe aphasia (per QAB) c/b less frequent phonemic and neologistic paraphasias on structured tasks, fluent but vague speech due to anomia, improving auditory comprehension, and increasing awareness of deficits. Continued education and instruction of expressive and receptive interventions and strategies to maximize functional communication. Given incremental progress and motivation to improve, pt would continue to benefit from skilled ST to address aformentioned deficits and to improve pt's ability to effectively  communicate at home and in the community.    OBJECTIVE IMPAIRMENTS: include expressive language, receptive language, and aphasia. These impairments are limiting patient from ADLs/IADLs and effectively communicating at home and in community. Factors affecting potential to achieve goals and functional outcome are  awareness of deficits . Patient will benefit from skilled SLP services to address above impairments and improve overall function.  REHAB POTENTIAL: Good  PLAN:  SLP FREQUENCY: 2x/week  SLP DURATION: 8 weeks (+ 6 weeks for recert)  PLANNED INTERVENTIONS: Language facilitation, Cueing hierachy, Multimodal communication approach, SLP instruction and feedback, Compensatory strategies, and Patient/family education    Gracy Racer, CCC-SLP 04/17/2023, 11:03 AM

## 2023-04-17 NOTE — Patient Instructions (Signed)
Practice reading and saying Lane's address and phone number

## 2023-04-19 ENCOUNTER — Ambulatory Visit: Payer: Medicare Other

## 2023-04-19 DIAGNOSIS — R4701 Aphasia: Secondary | ICD-10-CM | POA: Diagnosis not present

## 2023-04-19 NOTE — Therapy (Signed)
OUTPATIENT SPEECH LANGUAGE PATHOLOGY APHASIA TREATMENT    Patient Name: Juan [name] MRN: [id number] DOB:[birth year], 89 y.o., male Today's Date: 04/19/2023  PCP: Gretta Cool REFERRING PROVIDER: Charlton Amor, PA-C   END OF SESSION:  End of Session - 04/19/23 0759     Visit Number 18    Number of Visits 29    Date for SLP Re-Evaluation 05/29/23    Authorization Type [employer] Medicare    SLP Start Time 0800    SLP Stop Time  0844    SLP Time Calculation (min) 44 min    Activity Tolerance Patient tolerated treatment well                 Past Medical History:  Diagnosis Date   Arrhythmia    Diverticulitis 2004   Hyperlipidemia    Prostate cancer (HCC)    Shingles outbreak    LEFT CHEST WALL   Past Surgical History:  Procedure Laterality Date   APPENDECTOMY     Patient Active Problem List   Diagnosis Date Noted   CVA (cerebral vascular accident) (HCC) 02/01/2023   Acute CVA (cerebrovascular accident) (HCC) 01/28/2023   Acute encephalopathy 01/27/2023   Acidosis 01/27/2023   Stage 3a chronic kidney disease (CKD) (HCC) 01/27/2023   PAF (paroxysmal atrial fibrillation) (HCC)    Hyperlipidemia     ONSET DATE: 01-27-23   REFERRING DIAG: I63.9 (ICD-10-CM) - Cerebral infarction, unspecified   THERAPY DIAG: Aphasia  Rationale for Evaluation and Treatment: Rehabilitation  SUBJECTIVE:   SUBJECTIVE STATEMENT: "Doing well." Pt accompanied by: self   PERTINENT HISTORY: Juan [name] is a 89 y.o. male with past medical history of hyperlipidemia, prostate cancer and paroxysmal atrial fibrillation not on anticoagulation presented to the hospital with garbled speech, confusion and agitation.  Patient was subsequently found to CVA and admitted to the hospitalist service.   PAIN: Are you having pain? No  FALLS: Has patient fallen in last 6 months?  No  LIVING ENVIRONMENT: Lives with: lives alone (reports family member is currently helping at home) Lives in:  House/apartment  PLOF:  Level of assistance: Independent with ADLs, Independent with IADLs Employment: Retired  PATIENT GOALS: "Be able to carry on conversations" per daughter  OBJECTIVE:  TODAY'S TREATMENT:                                                                                                                                          04-19-23: SLP targeted word finding through VNEST. Patient required mod-A to identify varied subjects/objects to optimize diversity of sentences. He was able to expand with complete sentences when provided with min to mod prompting, phonetic, visual, and written cues. Patient demonstrated growth in this task from when addressed a month ago as he was able to name and elaborate with less assistance today (max A previously). HEP sent home of two additional VNEST exercises to review.  04-17-23: Continued to address writing today with pt returning with spelling HEP. Occasional errors noted at word level requiring intermittent mod to max A to ID and correct. Targeted functional naming and writing, in which pt able to name with 66% accuracy and write with 33% accuracy requiring mod to max A to correct. Practice functional naming, including daughter's address and phone number. Usual anomia and perseverations noted, which improved with written cues and mod verbal/visual cues. Re-certification completed today with pt making slow but steady improvements in communication effectiveness.   04-12-23: Completing fill in the blank from HEP. Patient was able to complete some homework with some mistakes. States he had use a dictionary to find some words. Pt required max-A of semantic, visual cues to find words, as well as reminders to slow down for self-corrections while writing. Patient was able to make verbal and written corrections with reminders to look at SLP for clarification. Led through word recall from descriptions. Patient was able to find words from descriptors with  intermittent mod/max-A of repetitions and reminders to slow down. HEP to continue work on filling in the blank for words within categories.   04-10-23: Pt entered with repeat description HEP completed with slightly more thorough descriptions. Performance appears to be impacted by anomia and impaired written expression. Targeted convergent naming today, with SLP presenting verbal description to address anomia and auditory comprehension. Pt able to independently answer with 65% accuracy with self-requested repetition 1-3x for more than half of items. Able to intermittently correct phonemic paraphasias with min prompting. Continues to benefit most from written cues versus semantic or sentence starters. Overall improving awareness of deficits exhibited, as pt endorsed spelling as area of difficulty. Provided spelling HEP task.  04-06-23: Patient completed HEP of describing words with one word answers. SLP targeted additional semantic feature analysis to optimize descriptions of words to address anomia. Patient required max-A of written, semantic and phonemic cues as well as visual aids to aid comprehension and completion of task. Patient required consistent repetition to increase comprehension and carryover. Continue to target in upcoming sessions.   04-05-23: Targeted paraphasias through semantic feature analysis. Patient was able to describe various objects with mod to max-A at 70% accuracy. Pt demonstrated improved accuracy of semantic features with gestural cues, semantic and phonemic cuing. Pt demonstrated intermittent awareness of his verbal errors and made attempts to self correct with min prompting. Models needed to increase articulation of various multi-syllable words. Auditory comprehension increased when with repetition, rephrasing and cued eye contact with/from the SLP. Patient guided through responding to questions in full sentences with visual models and cues. Patient able to read in complete sentences  with 100% accuracy when provided with model.   03-29-23: Family members, Maurice March and Wishram, attended today's session for caregiver education. Both reported pt exhibiting improved communication effectiveness at home, including effectively talking on the phone and telling family what he wants to eat. Intermittent use of gestures or pointing reported if anomia/dysnomia occurs. Today, SLP demonstrated how to support communication with cueing hierarchy and written supports. In functional naming task related to favorite summer activity and sports, pt demonstrated improved auditory comprehension with less frequent repetition needed and increasing error awareness with intermittent self-corrections in short structured conversations. Occasional cues provided to family members to allow patient opportunity to speak first and/or provide cues versus word. Given recent increased progress and new expectation for pt to spend some time alone in next few months, pt would benefit from additional sessions to optimize communication in  real life scenarios (ex: calling for help). Provided Aphasia Card to provide in case of emergency.   03-27-23: Pt presents with improving functional communication for understanding and answering basic questions. Continues to complete reading practice of multi-syllabic words, in which pt able to read familiar 3 syllables work with ~70% accuracy and self-correct errors with ~60% accuracy. Increased awareness of errors exhibited with pt looking to therapist for visual placement cues. Pt read aloud new minimal pairs with ~80% accuracy with intermittent self-corrections. Targeted answering comprehension questions related to targeted words, in which pt able to answer accurately with ~70% accuracy with occasional self-requested repetitions. Pt required 1-2 repetitions on average to comprehend each question, with SLP utilizing slow, stressed speech and occasionally written cues (key words). Generated simple  sentences with targeted words, in which pt able to complete with ~50% accuracy due to anomia and impaired sentence structured. Intermittent mod to max A required to correct.   03-23-23: Returned with multi-syllabic words (2-5 syllables) homework. Pt identified 4 words that he had trouble pronouncing. Pt able to verbalize those words with 50% accuracy independently; however, usual max A (verbal and visual) required to correct errors. Targeted verbal expression and auditory comprehension related to functional 2-3 word syllable words. Intermittent max A required to segment syllables with choral reading and fading cues. Consistent written cues required to aid understanding of targeted questions (ex: what do you refrigerate?). Reduced comprehension and awareness of errors noted while answering personally relevant questions (birthday and phone number) requiring max A for SLP to repair communication breakdown. As pt is nearing end of scheduled visits, pt would benefit from additional ST sessions as pt beginning to progress; plan to f/u with caregiver next session if available.   03-20-23: Returned homework with mostly accurate completion. Oral reading accuracy improved with intermittent cues to slow rate. Pt able to self-correct phonemic paraphasias with increased accuracy today given extra time and cues to slow rate. Good reading comprehension exhibited with rare cues required. Updated HEP to include oral reading of multi-syllabic words. Addressed STGs with some partially met goals due to modest progress thus far given severity of deficits.   03-17-23: Continued VNeST today, with frequent max A required to aid anomia and comprehension of WH-questions. Due to language impairment, pt perseverated on same responses due to overt difficulty with word retrieval. Sentence completions, first letters, and phonemic cues were ineffective to aid naming. Pt reliant on SLP writing targeted word for verbal naming and improved  comprehension of objective. Continue to recommend family using written supports at home given adequate reading comprehension. Intermittent mod to max A required to correct paraphasias at word level with limited awareness of errors today. Dicussed with son in lobby re: level of difficulty exhibited and need for 1:1 assistance at home to aid receptive and expressive language for targeted tasks. Son reported assisting with homework and providing recommended communication supports.   03-13-23: Targeted VNeST today to address expressive and receptive communication. Usual max A required to ID varied responses for "who" d/t reduced comprehension of targeted variations despite written cues and modeling. Occasional mod A required to ID "what" responses and answer WH-questions to expand responses. Occasional min A required to slow rate of reading to optimize speech accuracy during oral reading. Additional education and training required with VNeST next session to optimize pt understanding and performance. Provided structured task for functional naming at home. Pt appropriately request clarification x1 when SLP provided instructions.   03-08-23: Entered with homework given instructions to ID functional  words necessary to communicate at home. It appeared random words were copied that did not appear related to his daily life given pt unable to express personal connection despite written supports for comprehension. Targeted functional naming of every day pictures, in which pt able to name with 50% accuracy independently. Pt demonstrated increased awareness of errors but required usual mod to max A to correct phonemic paraphasias. Provided examples of functional naming practice to be completed at home with assistance from family members.     PATIENT EDUCATION: Education details: See above Person educated: Patient Education method: Explanation, Demonstration, Verbal cues, and Handouts Education comprehension: verbalized  understanding, returned demonstration, verbal cues required, and needs further education   GOALS: Goals reviewed with patient? Yes  SHORT TERM GOALS: Target date: 03/20/2023    Pt will ID and attempt to self correct paraphasias in 3/5 opportunities in a structured task across 2 sessions given mod-A.  Baseline: 03-20-23 Goal status: PARTIALLY MET  2.  Pt will use multi-modal communication to aid verbal expression in 3/5 opportunities in a structured task across 2 sessions given mod-A.  Baseline: 03-08-23 Goal status: PARTIALLY MET  3.  Pt will appropriately request repetition for auditory comprehension breakdown for 60% of opportunities during a structured task given occasional mod-A.  Baseline:  Goal status: NOT MET  4.  Pt will successfully carry over trained techniques to effectively communicate in 2 5-minute conversations given mod-A.  Baseline:  Goal status: NOT MET   LONG TERM GOALS: Target date: 04/17/2023 (05/29/2023 - recert)    Pt will ID and self correct paraphasias in 50% of opportunities during a 5 minute structured conversation given occasional mod-A.  Baseline:  Goal status: IN PROGRESS (downgraded at recert)  2.  Caregiver and/or pt will report perceived communicative improvement via PROM by d/c.  Baseline: 13/30 on communicative participation item bank- short form Goal status: IN PROGRESS (ongoing at recert)  3.   Pt will implement communication supports PRN during conversation on personally relevant topics given mod-A. Baseline:  Goal status: IN PROGRESS (downgraded at recert)  4.  Pt will successfully implement auditory comprehension techniques to effectively communicate in 5-minute conversation given min-A.  Baseline:  Goal status: IN PROGRESS (downgraded at recert)   ASSESSMENT:  CLINICAL IMPRESSION: Patient is a 89 y.o. male who was seen today for aphasia s/p CVA. Pt presents with slow but steady mild improvements in mod-severe aphasia (per QAB) c/b less  frequent phonemic and neologistic paraphasias on structured tasks, fluent but vague speech due to anomia, improving auditory comprehension, and increasing awareness of deficits. Continued education and instruction of expressive and receptive interventions and strategies to maximize functional communication. Given incremental progress and motivation to improve, pt would continue to benefit from skilled ST to address aformentioned deficits and to improve pt's ability to effectively communicate at home and in the community.    OBJECTIVE IMPAIRMENTS: include expressive language, receptive language, and aphasia. These impairments are limiting patient from ADLs/IADLs and effectively communicating at home and in community. Factors affecting potential to achieve goals and functional outcome are  awareness of deficits . Patient will benefit from skilled SLP services to address above impairments and improve overall function.  REHAB POTENTIAL: Good  PLAN:  SLP FREQUENCY: 2x/week  SLP DURATION: 8 weeks (+ 6 weeks for recert)  PLANNED INTERVENTIONS: Language facilitation, Cueing hierachy, Multimodal communication approach, SLP instruction and feedback, Compensatory strategies, and Patient/family education    Gracy Racer, CCC-SLP 04/19/2023, 8:47 AM

## 2023-04-24 ENCOUNTER — Ambulatory Visit: Payer: Medicare Other

## 2023-04-24 DIAGNOSIS — R4701 Aphasia: Secondary | ICD-10-CM

## 2023-04-24 NOTE — Therapy (Signed)
OUTPATIENT SPEECH LANGUAGE PATHOLOGY APHASIA TREATMENT    Patient Name: Juan [name] MRN: [id number] DOB:1926-06-18, 89 y.o., male Today's Date: 04/24/2023  PCP: Juan [name] REFERRING PROVIDER: Charlton Amor, PA-C   END OF SESSION:  End of Session - 04/24/23 0803     Visit Number 19    Number of Visits 29    Date for SLP Re-Evaluation 05/29/23    Authorization Type [employer] Medicare    SLP Start Time 0801    SLP Stop Time  0845    SLP Time Calculation (min) 44 min    Activity Tolerance Patient tolerated treatment well                 Past Medical History:  Diagnosis Date   Arrhythmia    Diverticulitis 2004   Hyperlipidemia    Prostate cancer (HCC)    Shingles outbreak    LEFT CHEST WALL   Past Surgical History:  Procedure Laterality Date   APPENDECTOMY     Patient Active Problem List   Diagnosis Date Noted   CVA (cerebral vascular accident) (HCC) 02/01/2023   Acute CVA (cerebrovascular accident) (HCC) 01/28/2023   Acute encephalopathy 01/27/2023   Acidosis 01/27/2023   Stage 3a chronic kidney disease (CKD) (HCC) 01/27/2023   PAF (paroxysmal atrial fibrillation) (HCC)    Hyperlipidemia     ONSET DATE: 01-27-23   REFERRING DIAG: I63.9 (ICD-10-CM) - Cerebral infarction, unspecified   THERAPY DIAG: Aphasia  Rationale for Evaluation and Treatment: Rehabilitation  SUBJECTIVE:   SUBJECTIVE STATEMENT: "doing well, hope you are." Pt accompanied by: self   PERTINENT HISTORY: Juan [name] is a 89 y.o. male with past medical history of hyperlipidemia, prostate cancer and paroxysmal atrial fibrillation not on anticoagulation presented to the hospital with garbled speech, confusion and agitation.  Patient was subsequently found to CVA and admitted to the hospitalist service.   PAIN: Are you having pain? No  FALLS: Has patient fallen in last 6 months?  No  LIVING ENVIRONMENT: Lives with: lives alone (reports family member is currently helping at  home) Lives in: House/apartment  PLOF:  Level of assistance: Independent with ADLs, Independent with IADLs Employment: Retired  PATIENT GOALS: "Be able to carry on conversations" per daughter  OBJECTIVE:  TODAY'S TREATMENT:                                                                                                                                          04-24-23: Patient stated that he moved into his daughters house over the weekend, and will be by himself during some parts of the day. Worked on functional phrases such as reciting new address. Patient struggled with more reciting numbers correctly. SLP utilized semantic and visual cues to aid error awareness and verbal corrections. Script written, and SLP led through giving medical information over the phone. Patient required multiple reminders to slow down, and take  his time while speaking to increase accuracy/clarity. Patient was able to read through emergency script lanyard given to him weeks ago that shared his communication difficulties. HEP of reading script and to complete rewording illogical sentences. SLP instructed HEP instructions first to aid comprehension before sent home.   04-19-23: SLP targeted word finding through VNEST. Patient required mod-A to identify varied subjects/objects to optimize diversity of sentences. He was able to expand with complete sentences when provided with min to mod prompting, phonetic, visual, and written cues. Patient demonstrated growth in this task from when addressed a month ago as he was able to name and elaborate with less assistance today (max A previously). HEP sent home of two additional VNEST exercises to review.   04-17-23: Continued to address writing today with pt returning with spelling HEP. Occasional errors noted at word level requiring intermittent mod to max A to ID and correct. Targeted functional naming and writing, in which pt able to name with 66% accuracy and write with 33% accuracy  requiring mod to max A to correct. Practice functional naming, including daughter's address and phone number. Usual anomia and perseverations noted, which improved with written cues and mod verbal/visual cues. Re-certification completed today with pt making slow but steady improvements in communication effectiveness.   04-12-23: Completing fill in the blank from HEP. Patient was able to complete some homework with some mistakes. States he had use a dictionary to find some words. Pt required max-A of semantic, visual cues to find words, as well as reminders to slow down for self-corrections while writing. Patient was able to make verbal and written corrections with reminders to look at SLP for clarification. Led through word recall from descriptions. Patient was able to find words from descriptors with intermittent mod/max-A of repetitions and reminders to slow down. HEP to continue work on filling in the blank for words within categories.   04-10-23: Pt entered with repeat description HEP completed with slightly more thorough descriptions. Performance appears to be impacted by anomia and impaired written expression. Targeted convergent naming today, with SLP presenting verbal description to address anomia and auditory comprehension. Pt able to independently answer with 65% accuracy with self-requested repetition 1-3x for more than half of items. Able to intermittently correct phonemic paraphasias with min prompting. Continues to benefit most from written cues versus semantic or sentence starters. Overall improving awareness of deficits exhibited, as pt endorsed spelling as area of difficulty. Provided spelling HEP task.  04-06-23: Patient completed HEP of describing words with one word answers. SLP targeted additional semantic feature analysis to optimize descriptions of words to address anomia. Patient required max-A of written, semantic and phonemic cues as well as visual aids to aid comprehension and completion  of task. Patient required consistent repetition to increase comprehension and carryover. Continue to target in upcoming sessions.   04-05-23: Targeted paraphasias through semantic feature analysis. Patient was able to describe various objects with mod to max-A at 70% accuracy. Pt demonstrated improved accuracy of semantic features with gestural cues, semantic and phonemic cuing. Pt demonstrated intermittent awareness of his verbal errors and made attempts to self correct with min prompting. Models needed to increase articulation of various multi-syllable words. Auditory comprehension increased when with repetition, rephrasing and cued eye contact with/from the SLP. Patient guided through responding to questions in full sentences with visual models and cues. Patient able to read in complete sentences with 100% accuracy when provided with model.   03-29-23: Family members, Maurice March and [place], attended today's session  for caregiver education. Both reported pt exhibiting improved communication effectiveness at home, including effectively talking on the phone and telling family what he wants to eat. Intermittent use of gestures or pointing reported if anomia/dysnomia occurs. Today, SLP demonstrated how to support communication with cueing hierarchy and written supports. In functional naming task related to favorite summer activity and sports, pt demonstrated improved auditory comprehension with less frequent repetition needed and increasing error awareness with intermittent self-corrections in short structured conversations. Occasional cues provided to family members to allow patient opportunity to speak first and/or provide cues versus word. Given recent increased progress and new expectation for pt to spend some time alone in next few months, pt would benefit from additional sessions to optimize communication in real life scenarios (ex: calling for help). Provided Aphasia Card to provide in case of emergency.    03-27-23: Pt presents with improving functional communication for understanding and answering basic questions. Continues to complete reading practice of multi-syllabic words, in which pt able to read familiar 3 syllables work with ~70% accuracy and self-correct errors with ~60% accuracy. Increased awareness of errors exhibited with pt looking to therapist for visual placement cues. Pt read aloud new minimal pairs with ~80% accuracy with intermittent self-corrections. Targeted answering comprehension questions related to targeted words, in which pt able to answer accurately with ~70% accuracy with occasional self-requested repetitions. Pt required 1-2 repetitions on average to comprehend each question, with SLP utilizing slow, stressed speech and occasionally written cues (key words). Generated simple sentences with targeted words, in which pt able to complete with ~50% accuracy due to anomia and impaired sentence structured. Intermittent mod to max A required to correct.   03-23-23: Returned with multi-syllabic words (2-5 syllables) homework. Pt identified 4 words that he had trouble pronouncing. Pt able to verbalize those words with 50% accuracy independently; however, usual max A (verbal and visual) required to correct errors. Targeted verbal expression and auditory comprehension related to functional 2-3 word syllable words. Intermittent max A required to segment syllables with choral reading and fading cues. Consistent written cues required to aid understanding of targeted questions (ex: what do you refrigerate?). Reduced comprehension and awareness of errors noted while answering personally relevant questions (birthday and phone number) requiring max A for SLP to repair communication breakdown. As pt is nearing end of scheduled visits, pt would benefit from additional ST sessions as pt beginning to progress; plan to f/u with caregiver next session if available.   03-20-23: Returned homework with mostly  accurate completion. Oral reading accuracy improved with intermittent cues to slow rate. Pt able to self-correct phonemic paraphasias with increased accuracy today given extra time and cues to slow rate. Good reading comprehension exhibited with rare cues required. Updated HEP to include oral reading of multi-syllabic words. Addressed STGs with some partially met goals due to modest progress thus far given severity of deficits.   03-17-23: Continued VNeST today, with frequent max A required to aid anomia and comprehension of WH-questions. Due to language impairment, pt perseverated on same responses due to overt difficulty with word retrieval. Sentence completions, first letters, and phonemic cues were ineffective to aid naming. Pt reliant on SLP writing targeted word for verbal naming and improved comprehension of objective. Continue to recommend family using written supports at home given adequate reading comprehension. Intermittent mod to max A required to correct paraphasias at word level with limited awareness of errors today. Dicussed with son in lobby re: level of difficulty exhibited and need for 1:1 assistance  at home to aid receptive and expressive language for targeted tasks. Son reported assisting with homework and providing recommended communication supports.   03-13-23: Targeted VNeST today to address expressive and receptive communication. Usual max A required to ID varied responses for "who" d/t reduced comprehension of targeted variations despite written cues and modeling. Occasional mod A required to ID "what" responses and answer WH-questions to expand responses. Occasional min A required to slow rate of reading to optimize speech accuracy during oral reading. Additional education and training required with VNeST next session to optimize pt understanding and performance. Provided structured task for functional naming at home. Pt appropriately request clarification x1 when SLP provided  instructions.   03-08-23: Entered with homework given instructions to ID functional words necessary to communicate at home. It appeared random words were copied that did not appear related to his daily life given pt unable to express personal connection despite written supports for comprehension. Targeted functional naming of every day pictures, in which pt able to name with 50% accuracy independently. Pt demonstrated increased awareness of errors but required usual mod to max A to correct phonemic paraphasias. Provided examples of functional naming practice to be completed at home with assistance from family members.     PATIENT EDUCATION: Education details: See above Person educated: Patient Education method: Explanation, Demonstration, Verbal cues, and Handouts Education comprehension: verbalized understanding, returned demonstration, verbal cues required, and needs further education   GOALS: Goals reviewed with patient? Yes  SHORT TERM GOALS: Target date: 03/20/2023    Pt will ID and attempt to self correct paraphasias in 3/5 opportunities in a structured task across 2 sessions given mod-A.  Baseline: 03-20-23 Goal status: PARTIALLY MET  2.  Pt will use multi-modal communication to aid verbal expression in 3/5 opportunities in a structured task across 2 sessions given mod-A.  Baseline: 03-08-23 Goal status: PARTIALLY MET  3.  Pt will appropriately request repetition for auditory comprehension breakdown for 60% of opportunities during a structured task given occasional mod-A.  Baseline:  Goal status: NOT MET  4.  Pt will successfully carry over trained techniques to effectively communicate in 2 5-minute conversations given mod-A.  Baseline:  Goal status: NOT MET   LONG TERM GOALS: Target date: 04/17/2023 (05/29/2023 - recert)    Pt will ID and self correct paraphasias in 50% of opportunities during a 5 minute structured conversation given occasional mod-A.  Baseline:  Goal status:  IN PROGRESS (downgraded at recert)  2.  Caregiver and/or pt will report perceived communicative improvement via PROM by d/c.  Baseline: 13/30 on communicative participation item bank- short form Goal status: IN PROGRESS (ongoing at recert)  3.   Pt will implement communication supports PRN during conversation on personally relevant topics given mod-A. Baseline:  Goal status: IN PROGRESS (downgraded at recert)  4.  Pt will successfully implement auditory comprehension techniques to effectively communicate in 5-minute conversation given min-A.  Baseline:  Goal status: IN PROGRESS (downgraded at recert)   ASSESSMENT:  CLINICAL IMPRESSION: Patient is a 89 y.o. male who was seen today for aphasia s/p CVA. Pt presents with slow but steady mild improvements in mod-severe aphasia (per QAB) c/b less frequent phonemic and neologistic paraphasias on structured tasks, fluent but vague speech due to anomia, improving auditory comprehension, and increasing awareness of deficits. Continued education and instruction of expressive and receptive interventions and strategies to maximize functional communication. Given incremental progress and motivation to improve, pt would continue to benefit from skilled ST to address  aformentioned deficits and to improve pt's ability to effectively communicate at home and in the community.    OBJECTIVE IMPAIRMENTS: include expressive language, receptive language, and aphasia. These impairments are limiting patient from ADLs/IADLs and effectively communicating at home and in community. Factors affecting potential to achieve goals and functional outcome are  awareness of deficits . Patient will benefit from skilled SLP services to address above impairments and improve overall function.  REHAB POTENTIAL: Good  PLAN:  SLP FREQUENCY: 2x/week  SLP DURATION: 8 weeks (+ 6 weeks for recert)  PLANNED INTERVENTIONS: Language facilitation, Cueing hierachy, Multimodal  communication approach, SLP instruction and feedback, Compensatory strategies, and Patient/family education    Gracy Racer, CCC-SLP 04/24/2023, 9:45 AM

## 2023-04-24 NOTE — Patient Instructions (Addendum)
My name is Juan [name].  I had a stroke.  I have difficulty talking.  My birthday is 1926/01/20   Emergency contact:  My daughter Leda Quail [phone]  My address is  [address]  [place], [place]  [address]   Medical History  I had a stroke March 2024.  I have difficulty talking.  I had prostate cancer in 1995. I have an irregular heartbeat.

## 2023-04-26 ENCOUNTER — Ambulatory Visit: Payer: Medicare Other

## 2023-04-26 DIAGNOSIS — H524 Presbyopia: Secondary | ICD-10-CM | POA: Diagnosis not present

## 2023-04-26 DIAGNOSIS — R4701 Aphasia: Secondary | ICD-10-CM

## 2023-04-26 NOTE — Therapy (Signed)
OUTPATIENT SPEECH LANGUAGE PATHOLOGY APHASIA TREATMENT (DISCHARGE)   Patient Name: Juan [name] MRN: [id number] DOB:1926/09/02, 89 y.o., male Today's Date: 04/26/2023  PCP: Gretta Cool REFERRING PROVIDER: Charlton Amor, PA-C   END OF SESSION:  End of Session - 04/26/23 0751     Visit Number 20    Number of Visits 29    Date for SLP Re-Evaluation 05/29/23    Authorization Type [employer] Medicare    SLP Start Time 0800    SLP Stop Time  0838    SLP Time Calculation (min) 38 min    Activity Tolerance Patient tolerated treatment well                 Past Medical History:  Diagnosis Date   Arrhythmia    Diverticulitis 2004   Hyperlipidemia    Prostate cancer (HCC)    Shingles outbreak    LEFT CHEST WALL   Past Surgical History:  Procedure Laterality Date   APPENDECTOMY     Patient Active Problem List   Diagnosis Date Noted   CVA (cerebral vascular accident) (HCC) 02/01/2023   Acute CVA (cerebrovascular accident) (HCC) 01/28/2023   Acute encephalopathy 01/27/2023   Acidosis 01/27/2023   Stage 3a chronic kidney disease (CKD) (HCC) 01/27/2023   PAF (paroxysmal atrial fibrillation) (HCC)    Hyperlipidemia     ONSET DATE: 01-27-23   REFERRING DIAG: I63.9 (ICD-10-CM) - Cerebral infarction, unspecified   THERAPY DIAG: Aphasia  Rationale for Evaluation and Treatment: Rehabilitation  SUBJECTIVE:   SUBJECTIVE STATEMENT: "I think my speech is going well." Pt accompanied by: self   PERTINENT HISTORY: Juan [name] is a 89 y.o. male with past medical history of hyperlipidemia, prostate cancer and paroxysmal atrial fibrillation not on anticoagulation presented to the hospital with garbled speech, confusion and agitation.  Patient was subsequently found to CVA and admitted to the hospitalist service.   PAIN: Are you having pain? No  FALLS: Has patient fallen in last 6 months?  No  LIVING ENVIRONMENT: Lives with: lives alone (reports family member is currently  helping at home) Lives in: House/apartment  PLOF:  Level of assistance: Independent with ADLs, Independent with IADLs Employment: Retired  PATIENT GOALS: "Be able to carry on conversations" per daughter  OBJECTIVE:  TODAY'S TREATMENT:                                                                                                                                          04-26-23: Patient and daughter verbalized they are pleased with current level and requested disharge at this time. SLP discussed previous aphasia strategies with daughter to take home and practice in lieu of exiting ST. Discussion on coming back to ST, PT, OT in future as needed. Patient feels good about progress with speech therapy and teaches back strategies/compensations of slowing down and taking his time while speaking. He feels like communicating has  become easier and states that it feels more comfortable. Patient led through [facility] naming test to collect data on post treatment with mild improvements exhibited as he was able to provide more semantic clues to get to his desired word. All questions answered to patient satisfaction.   04-24-23: Patient stated that he moved into his daughters house over the weekend, and will be by himself during some parts of the day. Worked on functional phrases such as reciting new address. Patient struggled with more reciting numbers correctly. SLP utilized semantic and visual cues to aid error awareness and verbal corrections. Script written, and SLP led through giving medical information over the phone. Patient required multiple reminders to slow down, and take his time while speaking to increase accuracy/clarity. Patient was able to read through emergency script lanyard given to him weeks ago that shared his communication difficulties. HEP of reading script and to complete rewording illogical sentences. SLP instructed HEP instructions first to aid comprehension before sent home.   04-19-23: SLP  targeted word finding through VNEST. Patient required mod-A to identify varied subjects/objects to optimize diversity of sentences. He was able to expand with complete sentences when provided with min to mod prompting, phonetic, visual, and written cues. Patient demonstrated growth in this task from when addressed a month ago as he was able to name and elaborate with less assistance today (max A previously). HEP sent home of two additional VNEST exercises to review.   04-17-23: Continued to address writing today with pt returning with spelling HEP. Occasional errors noted at word level requiring intermittent mod to max A to ID and correct. Targeted functional naming and writing, in which pt able to name with 66% accuracy and write with 33% accuracy requiring mod to max A to correct. Practice functional naming, including daughter's address and phone number. Usual anomia and perseverations noted, which improved with written cues and mod verbal/visual cues. Re-certification completed today with pt making slow but steady improvements in communication effectiveness.   04-12-23: Completing fill in the blank from HEP. Patient was able to complete some homework with some mistakes. States he had use a dictionary to find some words. Pt required max-A of semantic, visual cues to find words, as well as reminders to slow down for self-corrections while writing. Patient was able to make verbal and written corrections with reminders to look at SLP for clarification. Led through word recall from descriptions. Patient was able to find words from descriptors with intermittent mod/max-A of repetitions and reminders to slow down. HEP to continue work on filling in the blank for words within categories.   04-10-23: Pt entered with repeat description HEP completed with slightly more thorough descriptions. Performance appears to be impacted by anomia and impaired written expression. Targeted convergent naming today, with SLP presenting  verbal description to address anomia and auditory comprehension. Pt able to independently answer with 65% accuracy with self-requested repetition 1-3x for more than half of items. Able to intermittently correct phonemic paraphasias with min prompting. Continues to benefit most from written cues versus semantic or sentence starters. Overall improving awareness of deficits exhibited, as pt endorsed spelling as area of difficulty. Provided spelling HEP task.  04-06-23: Patient completed HEP of describing words with one word answers. SLP targeted additional semantic feature analysis to optimize descriptions of words to address anomia. Patient required max-A of written, semantic and phonemic cues as well as visual aids to aid comprehension and completion of task. Patient required consistent repetition to increase comprehension and carryover.  Continue to target in upcoming sessions.   04-05-23: Targeted paraphasias through semantic feature analysis. Patient was able to describe various objects with mod to max-A at 70% accuracy. Pt demonstrated improved accuracy of semantic features with gestural cues, semantic and phonemic cuing. Pt demonstrated intermittent awareness of his verbal errors and made attempts to self correct with min prompting. Models needed to increase articulation of various multi-syllable words. Auditory comprehension increased when with repetition, rephrasing and cued eye contact with/from the SLP. Patient guided through responding to questions in full sentences with visual models and cues. Patient able to read in complete sentences with 100% accuracy when provided with model.   03-29-23: Family members, Maurice March and [place], attended today's session for caregiver education. Both reported pt exhibiting improved communication effectiveness at home, including effectively talking on the phone and telling family what he wants to eat. Intermittent use of gestures or pointing reported if anomia/dysnomia occurs.  Today, SLP demonstrated how to support communication with cueing hierarchy and written supports. In functional naming task related to favorite summer activity and sports, pt demonstrated improved auditory comprehension with less frequent repetition needed and increasing error awareness with intermittent self-corrections in short structured conversations. Occasional cues provided to family members to allow patient opportunity to speak first and/or provide cues versus word. Given recent increased progress and new expectation for pt to spend some time alone in next few months, pt would benefit from additional sessions to optimize communication in real life scenarios (ex: calling for help). Provided Aphasia Card to provide in case of emergency.   03-27-23: Pt presents with improving functional communication for understanding and answering basic questions. Continues to complete reading practice of multi-syllabic words, in which pt able to read familiar 3 syllables work with ~70% accuracy and self-correct errors with ~60% accuracy. Increased awareness of errors exhibited with pt looking to therapist for visual placement cues. Pt read aloud new minimal pairs with ~80% accuracy with intermittent self-corrections. Targeted answering comprehension questions related to targeted words, in which pt able to answer accurately with ~70% accuracy with occasional self-requested repetitions. Pt required 1-2 repetitions on average to comprehend each question, with SLP utilizing slow, stressed speech and occasionally written cues (key words). Generated simple sentences with targeted words, in which pt able to complete with ~50% accuracy due to anomia and impaired sentence structured. Intermittent mod to max A required to correct.   03-23-23: Returned with multi-syllabic words (2-5 syllables) homework. Pt identified 4 words that he had trouble pronouncing. Pt able to verbalize those words with 50% accuracy independently; however, usual  max A (verbal and visual) required to correct errors. Targeted verbal expression and auditory comprehension related to functional 2-3 word syllable words. Intermittent max A required to segment syllables with choral reading and fading cues. Consistent written cues required to aid understanding of targeted questions (ex: what do you refrigerate?). Reduced comprehension and awareness of errors noted while answering personally relevant questions (birthday and phone number) requiring max A for SLP to repair communication breakdown. As pt is nearing end of scheduled visits, pt would benefit from additional ST sessions as pt beginning to progress; plan to f/u with caregiver next session if available.   03-20-23: Returned homework with mostly accurate completion. Oral reading accuracy improved with intermittent cues to slow rate. Pt able to self-correct phonemic paraphasias with increased accuracy today given extra time and cues to slow rate. Good reading comprehension exhibited with rare cues required. Updated HEP to include oral reading of multi-syllabic words. Addressed STGs  with some partially met goals due to modest progress thus far given severity of deficits.   03-17-23: Continued VNeST today, with frequent max A required to aid anomia and comprehension of WH-questions. Due to language impairment, pt perseverated on same responses due to overt difficulty with word retrieval. Sentence completions, first letters, and phonemic cues were ineffective to aid naming. Pt reliant on SLP writing targeted word for verbal naming and improved comprehension of objective. Continue to recommend family using written supports at home given adequate reading comprehension. Intermittent mod to max A required to correct paraphasias at word level with limited awareness of errors today. Dicussed with son in lobby re: level of difficulty exhibited and need for 1:1 assistance at home to aid receptive and expressive language for targeted  tasks. Son reported assisting with homework and providing recommended communication supports.   03-13-23: Targeted VNeST today to address expressive and receptive communication. Usual max A required to ID varied responses for "who" d/t reduced comprehension of targeted variations despite written cues and modeling. Occasional mod A required to ID "what" responses and answer WH-questions to expand responses. Occasional min A required to slow rate of reading to optimize speech accuracy during oral reading. Additional education and training required with VNeST next session to optimize pt understanding and performance. Provided structured task for functional naming at home. Pt appropriately request clarification x1 when SLP provided instructions.   03-08-23: Entered with homework given instructions to ID functional words necessary to communicate at home. It appeared random words were copied that did not appear related to his daily life given pt unable to express personal connection despite written supports for comprehension. Targeted functional naming of every day pictures, in which pt able to name with 50% accuracy independently. Pt demonstrated increased awareness of errors but required usual mod to max A to correct phonemic paraphasias. Provided examples of functional naming practice to be completed at home with assistance from family members.     PATIENT EDUCATION: Education details: See above Person educated: Patient Education method: Explanation, Demonstration, Verbal cues, and Handouts Education comprehension: verbalized understanding, returned demonstration, verbal cues required, and needs further education   GOALS: Goals reviewed with patient? Yes  SHORT TERM GOALS: Target date: 03/20/2023    Pt will ID and attempt to self correct paraphasias in 3/5 opportunities in a structured task across 2 sessions given mod-A.  Baseline: 03-20-23 Goal status: PARTIALLY MET  2.  Pt will use multi-modal  communication to aid verbal expression in 3/5 opportunities in a structured task across 2 sessions given mod-A.  Baseline: 03-08-23 Goal status: PARTIALLY MET  3.  Pt will appropriately request repetition for auditory comprehension breakdown for 60% of opportunities during a structured task given occasional mod-A.  Baseline:  Goal status: NOT MET  4.  Pt will successfully carry over trained techniques to effectively communicate in 2 5-minute conversations given mod-A.  Baseline:  Goal status: NOT MET   LONG TERM GOALS: Target date: 04/17/2023 (05/29/2023 - recert)    Pt will ID and self correct paraphasias in 50% of opportunities during a 5 minute structured conversation given occasional mod-A.  Baseline:  Goal status: MET   2.  Caregiver and/or pt will report perceived communicative improvement via PROM by d/c.  Baseline: 13/30 on communicative participation item bank- short form Goal status: MET   3.   Pt will implement communication supports PRN during conversation on personally relevant topics given mod-A. Baseline:  Goal status: MET   4.  Pt will  successfully implement auditory comprehension techniques to effectively communicate in 5-minute conversation given min-A.  Baseline:  Goal status: MET    ASSESSMENT:  CLINICAL IMPRESSION: Patient is a 89 y.o. male who was seen today for aphasia s/p CVA. Pt presents with slow but steady mild improvements in mod-severe aphasia (per QAB) c/b less frequent phonemic and neologistic paraphasias on structured tasks, fluent but vague speech due to anomia, improving auditory comprehension, and increasing awareness of deficits. Completed education and instruction of expressive and receptive interventions and strategies to maximize functional communication. Pt and daughter are pleased with current level of functioning with request to discharge today.  OBJECTIVE IMPAIRMENTS: include expressive language, receptive language, and aphasia. These  impairments are limiting patient from ADLs/IADLs and effectively communicating at home and in community. Factors affecting potential to achieve goals and functional outcome are  awareness of deficits . Patient will benefit from skilled SLP services to address above impairments and improve overall function.  REHAB POTENTIAL: Good  PLAN:  SLP FREQUENCY: 2x/week  SLP DURATION: 8 weeks (+ 6 weeks for recert)  PLANNED INTERVENTIONS: Language facilitation, Cueing hierachy, Multimodal communication approach, SLP instruction and feedback, Compensatory strategies, and Patient/family education SPEECH THERAPY DISCHARGE SUMMARY  Visits from Start of Care: 20  Current functional level related to goals / functional outcomes: Patient has met or partially met short and long term goals. He demonstrates good compensation strategies of taking time. He has increased in his awareness of his errors and will attempt to go back and correct them with mod-A. Both patient and daughter report improvements in speech. Requested HEP activities to continue work at home. Requested ST discharge this date.    Remaining deficits: Aphasia    Education / Equipment: Language facilitation, Cueing hierachy, Multimodal communication approach, SLP instruction and feedback, Compensatory strategies, and Patient/family education   Patient agrees to discharge. Patient goals were met. Patient is being discharged due to being pleased with the current functional level.Marland Kitchen  Speech Therapy Progress Note  Dates of Reporting Period: 03/27/23 to Current  Objective Reports of Subjective Statement: Patient has been seen for 20 ST visits targeting aphasia. Patient and family reporting subjective improvements.   Objective Measurements: Patient exhibits improved auditory comprehension, improved awareness and corrections of paraphasias. Overall improved ability to functionally communication in functional communication.   Goal Update: Goals met.    Plan: Discharge today.   Reason Skilled Services are Required: Patient and daughter requested ST discharge today, aware of ability to request additional ST if needed.    Gracy Racer, CCC-SLP 04/26/2023, 8:46 AM

## 2023-04-26 NOTE — Patient Instructions (Signed)
Improving your language skills: Daily practice that focuses on challenging your language system Schedule a practice session into your daily routine Determine how long you will practice and when Choose from an activity that we've taught you  I will practice for _____ (how long) minutes a day at __________ (when).  I will remember to practice by ___________________ (setting reminder, adding to to do list, scheduling on calendar, having family member remind you)   Activities:  Modify as needed to work at a level that makes you think but is doable!   Verb Network Strength Training Use the outline we provided to guide your practice Pick 3-5 verbs  Get creative and think of 3 different who [verb] what for each verb Answer the when, why, where questions if you can for each sentence you made   For example: verb= EAT   I eat cake    Why? To celebrate    When? On my birthday    Where? At the party   My dog (name) eat [employer] dog food    Why? To live, because he's hungry    When? 2x a day, morning and night    Where? In the kitchen   EASIER: use simple verbs which are easy to apply to a variety of people; get help from your communication partner  MORE DIFFICULT:  use more complex or creative verbs; write a short narrative featuring one of your verb sentences    2.    Semantic Feature Analysis Pick 5 objects (can be photos, things in your environment, or just thought up) Use the visual aid you've been given to guide your practice Try to completely describe the selected object using the prompts on your chart   EASIER: use objects, look at the object you're describing (photo or in person), get clues from a partner   MORE DIFFICULT: use abstract or unfamiliar objects or an occupation, try to describe from memory   *REMEMBER* Describing is a great tool for when you can't think of a word in conversation. This practice can help build your word inventory but can also help you be able to  describe when you are having a conversation and cannot think of the word you're looking for!!  3.   Picture Description Find pictures you can use. Pick 5 for your practice on this day.   Some ideas are:  personal albums Cell phone photos Magazines Yearbook Newspaper Online  Social media  Describe as completely as you can.   Who, what, where, when, why, how  What does it make you think of?  What memories do you have of this event?  Do you have any opinions about what you see?   This is an activity that may be fun to do with someone else. Share your ideas!   EASIER:   MORE DIFFICULT:   4.   Object Naming Go around the rooms in your home and name each object you see.  Go outside and name what you see. Open drawers, cabinets, closets and name the items you see. Look around and name items that begin with a certain letter. Look around and name items that are a certain color.  Find a photo and name as many objects as you can find  5.   Positive Attributes List as many people as you can from a category: Family Friends church members Singers movie stars TV actors Characters on a show Politicians Scholars co-workers, Then generate one positive attribute to describe each person   on your list. Be creative and make sure each positive quality is unique!   MAKE IT MORE CHALLENGING: write a short note to people you know telling them the positive attribute you think they possess and why. Let them know you love them!  6.   New [place] Times: What's Going on in this Picture https://www.nytimes.com/column/learning-whats-going-on-in-this-picture  EASIER:  name 10 items you see in the photo Create a caption for the photo MORE CHALLENGING:  Use the photo to write a short story that could go with the photo.   Check back later to see what the real story is. Talk about what you thought vs what the real story was.  Share the photo with someone you know. Come up with three ideas of what can  be happening  6.   Journaling Writing can be a wonderful way to expand your language skills Writing provides to opportunity to be careful with your word choice and think of creative ways to say what you're thinking  Here are some ways to journal: Pick a general prompt that you can answer every day What I did today What is on my mind Google daily journal prompt to find something creative Buy a journal (amazon, Barnes and Noble, etc) with pre-prescribed prompts  Buy a memory book with prompts to organize your life story 'This Life of Mine: A Keepsake Journal for Grandparents, Parents and Anyone to Preserve Memories, Moments & Milestones' Grandparent-Grandchild Writing Kit [employer]  My life in a book Start a scrapbook Pick meaningful photos Put 1-3 photos on a page (if more than one, they should be related) Jot down your memories sparked by the image(s)   7.   Verb A Day Choose a verb for that day Come up with 10 phrases that use the verb Keep the verb in your mind all day, revisiting it whenever you get a chance Try to use this verb as much as possible when talking to other people or describing your actions as you move throughout your day After dinner, review your verb and see if it feels more familiar since the morning  8.   Play Pictionary (can buy the game or just play informally)  Player One draws something on a piece of paper, and Player Two has to guess what it is.  For example, if the word you're thinking of is "birthday," you may draw a cake with candles, party balloons, or a calendar.  Keep switching off so you get a chance to draw and you get a chance guess the answers.   9.   Plan a trip Choose a place that you've always wanted to visit Look up pictures on the Internet, or use Tripadvisor to create a list of sites you'd like to see If you have time, write the alphabet down the left side of a piece of paper, finding one element of this place for each letter For  example, if the place you want to go to is [place], you may have the [place] for "E" or subway ride for "S."   10.  Poetry Reading Poetry is musical in nature, so it is sometimes easier to read a short poem than it is to read a short article. A great poet for often funny and sometimes brief works is Shel Silverstein. He uses rhymes and repetition to create a rhythm in his poems. His poetry is also suitable for kids, so his work is the perfect poetry to practice in front of grandchildren. Set   up a Zoom call with a friend or family member and read them a poem today.   Think about your opinions on the poem. What did it make you think or feel? Do you have an opinion on the poem?   11.  Summarize watch/listen to podcast, show, movie, sermon, TED talk, audiobook, etc write or speak a brief summary  MAKE IT MORE CHALLENGING:  Zoom in: what were the facts (summarize) Zoom out: what was the message or key point Zoom deep and wide: how does this apply to you or someone you know   12.  Speaking Crosswords Completing crossword puzzles is a great way to practice speech and word finding because they're often a little challenging. Filling out a crossword puzzle means playing with words. You can take your crossword puzzle to the next level by speaking your crossword puzzle. In other words, read aloud each clue before you try to solve it. The New York Times makes mini-puzzles that they post online and in books. Try to fill one out every day while speaking the clues aloud.  Mini puzzles from NYT can be found on their website, on the NYT Games app, or there are books of them (NYT store or Amazon)   13.  Get social post on social media interact with another's social media post send a few friends an email or text message  

## 2023-05-01 ENCOUNTER — Ambulatory Visit: Payer: Medicare Other | Admitting: Neurology

## 2023-05-01 ENCOUNTER — Encounter: Payer: Self-pay | Admitting: Neurology

## 2023-05-01 VITALS — BP 145/93 | HR 93 | Ht 71.0 in | Wt 212.0 lb

## 2023-05-01 DIAGNOSIS — I482 Chronic atrial fibrillation, unspecified: Secondary | ICD-10-CM

## 2023-05-01 DIAGNOSIS — E7849 Other hyperlipidemia: Secondary | ICD-10-CM

## 2023-05-01 DIAGNOSIS — I69320 Aphasia following cerebral infarction: Secondary | ICD-10-CM

## 2023-05-01 DIAGNOSIS — I63412 Cerebral infarction due to embolism of left middle cerebral artery: Secondary | ICD-10-CM | POA: Diagnosis not present

## 2023-05-01 NOTE — Progress Notes (Signed)
[facility] [address] [place]. [place] [address] [phone]       OFFICE FOLLOW-UP NOTE  Juan [name] Date of Birth:  1926/09/20 Medical Record Number:  [id number]   HPI: Juan [name] is a pleasant 89 year old Caucasian male seen today for initial office visit following hospital consultation for stroke in March 2024.  He is accompanied by his daughter.  History is obtained from them and review of electronic medical records.  I personally reviewed pertinent available imaging films in PACS.  He has past medical history of hyperlipidemia, diverticulitis, prostate cancer, chronic atrial fibrillation not on anticoagulation.  He presented on 01/27/2023 for evaluation for sudden onset of not acting like himself with garbled speech.  Last known well was unclear.  CT head on admission showed subacute hypodensity in the left cerebellum and encephalomalacia in the right frontal lobe.  CT angiogram of the head and neck showed no large vessel occlusion high-grade stenosis.  MRI scan confirmed acute infarct involving the left MCA  And occipital region as well as an acute left superior cerebellar infarct.  Echocardiogram showed ejection fraction of 55 to 60% with mild concentric hypertrophy as well as left atrium dilatation.  EEG showed mild cortical dysfunction in the left temporal region but no seizures.  LDL cholesterol elevated at 90 mg percent.  Hemoglobin A1c was 5.9.  Patient has history of chronic atrial fibrillation but has not been on anticoagulation.  He was started on Eliquis and Pravachol 40 mg daily.  He has finished outpatient speech therapy last week and has obtained substantial improvement.  He is able to communicate and speak but has to speak slowly and has occasional word hesitancy.  He has not been to live with his daughter.  He is mostly independent in activities of daily living.  He is tolerating Eliquis well without bruising or bleeding.  Tolerating Pravachol well  without muscle aches and pains.  He has not had any follow-up lipid profile checked.  Denies any prior history of strokes CT head on admission actually showing encephalomalacia in the right frontal lobe which may have been from a silent stroke.  He has no physical deficits from the stroke except mild aphasia which also seems to be improving quite well.  He has no complaints today.    ROS:   14 system review of systems is positive for aphasia, speech difficulties, word finding difficulties, bruising, decreased hearing and all other systems negative  PMH:  Past Medical History:  Diagnosis Date   Arrhythmia    Diverticulitis 2004   Hyperlipidemia    Prostate cancer (HCC)    Shingles outbreak    LEFT CHEST WALL    Social History:  Social History   Socioeconomic History   Marital status: Married    Spouse name: Not on file   Number of children: Not on file   Years of education: Not on file   Highest education level: Not on file  Occupational History   Not on file  Tobacco Use   Smoking status: Never   Smokeless tobacco: Not on file  Substance and Sexual Activity   Alcohol use: Not on file   Drug use: No   Sexual activity: Not on file  Other Topics Concern   Not on file  Social History Narrative   Not on file   Social Determinants of Health   Financial Resource Strain: Not on file  Food Insecurity: Patient Unable To Answer (01/27/2023)   Hunger Vital Sign  Worried About Programme researcher, broadcasting/film/video in the Last Year: Patient unable to answer    Ran Out of Food in the Last Year: Patient unable to answer  Transportation Needs: Patient Unable To Answer (01/27/2023)   PRAPARE - Transportation    Lack of Transportation (Medical): Patient unable to answer    Lack of Transportation (Non-Medical): Patient unable to answer  Physical Activity: Not on file  Stress: Not on file  Social Connections: Not on file  Intimate Partner Violence: Patient Unable To Answer (01/27/2023)   Humiliation,  Afraid, Rape, and Kick questionnaire    Fear of Current or Ex-Partner: Patient unable to answer    Emotionally Abused: Patient unable to answer    Physically Abused: Patient unable to answer    Sexually Abused: Patient unable to answer    Medications:   Current Outpatient Medications on File Prior to Visit  Medication Sig Dispense Refill   apixaban (ELIQUIS) 5 MG TABS tablet Take 1 tablet (5 mg total) by mouth 2 (two) times daily. 60 tablet 1   rosuvastatin (CRESTOR) 20 MG tablet Take 1 tablet (20 mg total) by mouth daily. 30 tablet 1   No current facility-administered medications on file prior to visit.    Allergies:  No Known Allergies  Physical Exam General: well developed, well nourished pleasant elderly Caucasian male, seated, in no evident distress Head: head normocephalic and atraumatic.  Neck: supple with no carotid or supraclavicular bruits Cardiovascular: regular rate and rhythm, no murmurs Musculoskeletal: no deformity Skin:  no rash/petichiae Vascular:  Normal pulses all extremities Vitals:   05/01/23 0837  BP: (!) 145/93  Pulse: 93   Neurologic Exam Mental Status: Awake and fully alert. Oriented to place and time. Recent and remote memory diminished. Attention span, concentration and fund of knowledge diminished mood and affect appropriate.  Mild expressive aphasia with occasional word finding difficulties versus intensity.  Slight impairment of repetition and naming.  Good comprehension.  Occasional paraphasic errors. Cranial Nerves: Fundoscopic exam reveals sharp disc margins. Pupils equal, briskly reactive to light. Extraocular movements full without nystagmus. Visual fields full to confrontation. Hearing slightly diminished bilaterally. Facial sensation intact. Face, tongue, palate moves normally and symmetrically.  Motor: Normal bulk and tone. Normal strength in all tested extremity muscles. Sensory.: intact to touch ,pinprick .position and vibratory sensation.   Coordination: Rapid alternating movements normal in all extremities. Finger-to-nose and heel-to-shin performed accurately bilaterally. Gait and Station: Arises from chair without difficulty. Stance is normal. Gait demonstrates normal stride length and balance . Able to heel, toe and tandem walk with moderate difficulty.  Reflexes: 1+ and symmetric. Toes downgoing.   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  He is doing remarkably well with only mild residual aphasia.  Vascular risk factors of atrial fibrillation and hyperlipidemia and age only.     PLAN: I had a long d/w patient and his daughter about his recent cardioembolic stroke, chronic atrial fibrillation and resultant posr stroke aphasia, risk for recurrent stroke/TIAs, personally independently reviewed imaging studies and stroke evaluation results and answered questions.Continue Eliquis (apixaban) daily  for secondary stroke prevention and maintain strict control of hypertension with blood pressure goal below 130/90, diabetes with hemoglobin A1c goal below 6.5% and lipids with LDL cholesterol goal below 70 mg/dL. I also advised the patient to eat a healthy diet with plenty of  whole grains, cereals, fruits and vegetables, exercise regularly and maintain ideal body weight check follow-up lipid profile today.  Patient may also consider possible participation in  [place] AF study (apixaban versus factor XI inhibitor Milvexian ) if interested and will be given information to review at home and decide.  Followup in the future with me in 6 months or call earlier if necessary. Greater than 50% of time during this 45 minute visit was spent on counseling,explanation of diagnosis of atrial fibrillation, risk of recurrent stroke and risk-benefit of anticoagulation, planning of further management,  discussion with patient and family and coordination of care Delia Heady, MD Note: This document was prepared with digital dictation and possible smart phrase technology. Any transcriptional errors that result from this process are unintentional

## 2023-05-01 NOTE — Patient Instructions (Signed)
I had a long d/w patient and his daughter about his recent cardioembolic stroke, chronic atrial fibrillation and resultant posr stroke aphasia, risk for recurrent stroke/TIAs, personally independently reviewed imaging studies and stroke evaluation results and answered questions.Continue Eliquis (apixaban) daily  for secondary stroke prevention and maintain strict control of hypertension with blood pressure goal below 130/90, diabetes with hemoglobin A1c goal below 6.5% and lipids with LDL cholesterol goal below 70 mg/dL. I also advised the patient to eat a healthy diet with plenty of whole grains, cereals, fruits and vegetables, exercise regularly and maintain ideal body weight check follow-up lipid profile today.  Patient may also consider possible participation in  [place] AF study (apixaban versus factor XI inhibitor Milvexian ) if interested and will be given information to review at home and decide.  Followup in the future with me in 6 months or call earlier if necessary.  Stroke Prevention Some medical conditions and behaviors can lead to a higher chance of having a stroke. You can help prevent a stroke by eating healthy, exercising, not smoking, and managing any medical conditions you have. Stroke is a leading cause of functional impairment. Primary prevention is particularly important because a majority of strokes are first-time events. Stroke changes the lives of not only those who experience a stroke but also their family and other caregivers. How can this condition affect me? A stroke is a medical emergency and should be treated right away. A stroke can lead to brain damage and can sometimes be life-threatening. If a person gets medical treatment right away, there is a better chance of surviving and recovering from a stroke. What can increase my risk? The following medical conditions may increase your risk of a stroke: Cardiovascular disease. High blood pressure (hypertension). Diabetes. High  cholesterol. Sickle cell disease. Blood clotting disorders (hypercoagulable state). Obesity. Sleep disorders (obstructive sleep apnea). Other risk factors include: Being older than age 40. Having a history of blood clots, stroke, or mini-stroke (transient ischemic attack, TIA). Genetic factors, such as race, ethnicity, or a family history of stroke. Smoking cigarettes or using other tobacco products. Taking birth control pills, especially if you also use tobacco. Heavy use of alcohol or drugs, especially cocaine and methamphetamine. Physical inactivity. What actions can I take to prevent this? Manage your health conditions High cholesterol levels. Eating a healthy diet is important for preventing high cholesterol. If cholesterol cannot be managed through diet alone, you may need to take medicines. Take any prescribed medicines to control your cholesterol as told by your health care provider. Hypertension. To reduce your risk of stroke, try to keep your blood pressure below 130/80. Eating a healthy diet and exercising regularly are important for controlling blood pressure. If these steps are not enough to manage your blood pressure, you may need to take medicines. Take any prescribed medicines to control hypertension as told by your health care provider. Ask your health care provider if you should monitor your blood pressure at home. Have your blood pressure checked every year, even if your blood pressure is normal. Blood pressure increases with age and some medical conditions. Diabetes. Eating a healthy diet and exercising regularly are important parts of managing your blood sugar (glucose). If your blood sugar cannot be managed through diet and exercise, you may need to take medicines. Take any prescribed medicines to control your diabetes as told by your health care provider. Get evaluated for obstructive sleep apnea. Talk to your health care provider about getting a sleep evaluation if  you snore a lot or have excessive sleepiness. Make sure that any other medical conditions you have, such as atrial fibrillation or atherosclerosis, are managed. Nutrition Follow instructions from your health care provider about what to eat or drink to help manage your health condition. These instructions may include: Reducing your daily calorie intake. Limiting how much salt (sodium) you use to 1,500 milligrams (mg) each day. Using only healthy fats for cooking, such as olive oil, canola oil, or sunflower oil. Eating healthy foods. You can do this by: Choosing foods that are high in fiber, such as whole grains, and fresh fruits and vegetables. Eating at least 5 servings of fruits and vegetables a day. Try to fill one-half of your plate with fruits and vegetables at each meal. Choosing lean protein foods, such as lean cuts of meat, poultry without skin, fish, tofu, beans, and nuts. Eating low-fat dairy products. Avoiding foods that are high in sodium. This can help lower blood pressure. Avoiding foods that have saturated fat, trans fat, and cholesterol. This can help prevent high cholesterol. Avoiding processed and prepared foods. Counting your daily carbohydrate intake.  Lifestyle If you drink alcohol: Limit how much you have to: 0-1 drink a day for women who are not pregnant. 0-2 drinks a day for men. Know how much alcohol is in your drink. In the [place] one drink equals one 12 oz bottle of beer ( ), one 5 oz glass of wine ( ), or one 1 oz glass of hard liquor (27mL). Do not use any products that contain nicotine or tobacco. These products include cigarettes, chewing tobacco, and vaping devices, such as e-cigarettes. If you need help quitting, ask your health care provider. Avoid secondhand smoke. Do not use drugs. Activity  Try to stay at a healthy weight. Get at least 30 minutes of exercise on most days, such as: Fast walking. Biking. Swimming. Medicines Take  over-the-counter and prescription medicines only as told by your health care provider. Aspirin or blood thinners (antiplatelets or anticoagulants) may be recommended to reduce your risk of forming blood clots that can lead to stroke. Avoid taking birth control pills. Talk to your health care provider about the risks of taking birth control pills if: You are over 1 years old. You smoke. You get very bad headaches. You have had a blood clot. Where to find more information American Stroke Association: www.strokeassociation.org Get help right away if: You or a loved one has any symptoms of a stroke. "BE FAST" is an easy way to remember the main warning signs of a stroke: B - Balance. Signs are dizziness, sudden trouble walking, or loss of balance. E - Eyes. Signs are trouble seeing or a sudden change in vision. F - Face. Signs are sudden weakness or numbness of the face, or the face or eyelid drooping on one side. A - Arms. Signs are weakness or numbness in an arm. This happens suddenly and usually on one side of the body. S - Speech. Signs are sudden trouble speaking, slurred speech, or trouble understanding what people say. T - Time. Time to call emergency services. Write down what time symptoms started. You or a loved one has other signs of a stroke, such as: A sudden, severe headache with no known cause. Nausea or vomiting. Seizure. These symptoms may represent a serious problem that is an emergency. Do not wait to see if the symptoms will go away. Get medical help right away. Call your local emergency services (911 in the [place] Do not  drive yourself to the hospital. Summary You can help to prevent a stroke by eating healthy, exercising, not smoking, limiting alcohol intake, and managing any medical conditions you may have. Do not use any products that contain nicotine or tobacco. These include cigarettes, chewing tobacco, and vaping devices, such as e-cigarettes. If you need help quitting,  ask your health care provider. Remember "BE FAST" for warning signs of a stroke. Get help right away if you or a loved one has any of these signs. This information is not intended to replace advice given to you by your health care provider. Make sure you discuss any questions you have with your health care provider. Document Revised: 09/26/2022 Document Reviewed: 09/26/2022 Elsevier Patient Education  2024 [employer].

## 2023-05-02 LAB — LIPID PANEL
Chol/HDL Ratio: 2.1 ratio (ref 0.0–5.0)
Cholesterol, Total: 139 mg/dL (ref 100–199)
HDL: 66 mg/dL (ref >39)
LDL Chol Calc (NIH): 58 mg/dL (ref 0–99)
Triglycerides: 75 mg/dL (ref 0–149)
VLDL Cholesterol Cal: 15 mg/dL (ref 5–40)

## 2023-05-05 NOTE — Progress Notes (Signed)
Kindly inform the patient that cholesterol profile looks good

## 2023-05-22 DIAGNOSIS — F01A0 Vascular dementia, mild, without behavioral disturbance, psychotic disturbance, mood disturbance, and anxiety: Secondary | ICD-10-CM | POA: Diagnosis not present

## 2023-05-22 DIAGNOSIS — I69320 Aphasia following cerebral infarction: Secondary | ICD-10-CM | POA: Diagnosis not present

## 2023-05-22 DIAGNOSIS — N1831 Chronic kidney disease, stage 3a: Secondary | ICD-10-CM | POA: Diagnosis not present

## 2023-05-22 DIAGNOSIS — R6889 Other general symptoms and signs: Secondary | ICD-10-CM | POA: Diagnosis not present

## 2023-05-28 ENCOUNTER — Other Ambulatory Visit: Payer: Self-pay | Admitting: Physical Medicine & Rehabilitation

## 2023-05-31 ENCOUNTER — Other Ambulatory Visit: Payer: Self-pay | Admitting: Physical Medicine & Rehabilitation

## 2023-09-07 DIAGNOSIS — I480 Paroxysmal atrial fibrillation: Secondary | ICD-10-CM | POA: Diagnosis not present

## 2023-09-07 DIAGNOSIS — E538 Deficiency of other specified B group vitamins: Secondary | ICD-10-CM | POA: Diagnosis not present

## 2023-09-07 DIAGNOSIS — F01A0 Vascular dementia, mild, without behavioral disturbance, psychotic disturbance, mood disturbance, and anxiety: Secondary | ICD-10-CM | POA: Diagnosis not present

## 2023-09-07 DIAGNOSIS — Z8673 Personal history of transient ischemic attack (TIA), and cerebral infarction without residual deficits: Secondary | ICD-10-CM | POA: Diagnosis not present

## 2023-09-07 DIAGNOSIS — Z23 Encounter for immunization: Secondary | ICD-10-CM | POA: Diagnosis not present

## 2023-10-19 DIAGNOSIS — J069 Acute upper respiratory infection, unspecified: Secondary | ICD-10-CM | POA: Diagnosis not present

## 2023-11-30 ENCOUNTER — Ambulatory Visit: Payer: Medicare Other | Admitting: Neurology

## 2023-11-30 ENCOUNTER — Encounter: Payer: Self-pay | Admitting: Neurology

## 2023-11-30 VITALS — BP 165/96 | HR 77 | Ht 70.0 in | Wt 230.0 lb

## 2023-11-30 DIAGNOSIS — I63412 Cerebral infarction due to embolism of left middle cerebral artery: Secondary | ICD-10-CM | POA: Diagnosis not present

## 2023-11-30 DIAGNOSIS — I69320 Aphasia following cerebral infarction: Secondary | ICD-10-CM

## 2023-11-30 DIAGNOSIS — I4811 Longstanding persistent atrial fibrillation: Secondary | ICD-10-CM | POA: Diagnosis not present

## 2023-11-30 NOTE — Progress Notes (Addendum)
[facility] [address] [place]. [place] [address] [phone]       OFFICE FOLLOW-UP NOTE  Mr. Juan [name] Date of Birth:  04/02/1926 Medical Record Number:  [id number]   HPI: Initial visit 05/01/2023 Juan [name] is a pleasant 89 year old Caucasian male seen today for initial office visit following hospital consultation for stroke in March 2024.  He is accompanied by his daughter.  History is obtained from them and review of electronic medical records.  I personally reviewed pertinent available imaging films in PACS.  He has past medical history of hyperlipidemia, diverticulitis, prostate cancer, chronic atrial fibrillation not on anticoagulation.  He presented on 01/27/2023 for evaluation for sudden onset of not acting like himself with garbled speech.  Last known well was unclear.  CT head on admission showed subacute hypodensity in the left cerebellum and encephalomalacia in the right frontal lobe.  CT angiogram of the head and neck showed no large vessel occlusion high-grade stenosis.  MRI scan confirmed acute infarct involving the left MCA  And occipital region as well as an acute left superior cerebellar infarct.  Echocardiogram showed ejection fraction of 55 to 60% with mild concentric hypertrophy as well as left atrium dilatation.  EEG showed mild cortical dysfunction in the left temporal region but no seizures.  LDL cholesterol elevated at 90 mg percent.  Hemoglobin A1c was 5.9.  Patient has history of chronic atrial fibrillation but has not been on anticoagulation.  He was started on Eliquis and Pravachol 40 mg daily.  He has finished outpatient speech therapy last week and has obtained substantial improvement.  He is able to communicate and speak but has to speak slowly and has occasional word hesitancy.  He has not been to live with his daughter.  He is mostly independent in activities of daily living.  He is tolerating Eliquis well without bruising or bleeding.   Tolerating Pravachol well without muscle aches and pains.  He has not had any follow-up lipid profile checked.  Denies any prior history of strokes CT head on admission actually showing encephalomalacia in the right frontal lobe which may have been from a silent stroke.  He has no physical deficits from the stroke except mild aphasia which also seems to be improving quite well.  He has no complaints today.  Updated 11/30/2023  : He returns for follow-up after last visit 6 months ago.  He is accompanied by his daughter.  He continues to do well.  He is more recurrent stroke or TIA symptoms.  His speech appears to have improved but he still has mild expressive language difficulties and some paraphasic errors and word finding difficulties.  He is speaking more sentences and is able to communicate quite well.  He remains on Eliquis which is tolerating well without significant bleeding and only minor bruising.  His blood pressure tends to run quite good at home but today it is elevated in office at 165/96.  He has had no new health issues.  He remains on Crestor which is tolerating well without muscle aches and pains and last lipid profile on 05/01/2023 showed LDL-cholesterol to be optimal at 58 mg percent.  He has no complaints today.  He is mostly independent in activities of daily living.  He can be left alone for several hours but he is living with his daughter.   ROS:   14 system review of systems is positive for aphasia, speech difficulties, word finding difficulties, bruising, decreased hearing and all other systems  negative  PMH:  Past Medical History:  Diagnosis Date   Arrhythmia    Diverticulitis 2004   Hyperlipidemia    Prostate cancer (HCC)    Shingles outbreak    LEFT CHEST WALL    Social History:  Social History   Socioeconomic History   Marital status: Married    Spouse name: Not on file   Number of children: Not on file   Years of education: Not on file   Highest education level:  Not on file  Occupational History   Not on file  Tobacco Use   Smoking status: Never   Smokeless tobacco: Not on file  Substance and Sexual Activity   Alcohol use: Yes    Alcohol/week: 2.0 standard drinks of alcohol    Types: 2 Glasses of wine per week   Drug use: No   Sexual activity: Not on file  Other Topics Concern   Not on file  Social History Narrative   Pt lives with daughter    Pt retired    Social Drivers of Corporate investment banker Strain: Not on file  Food Insecurity: Patient Unable To Answer (01/27/2023)   Hunger Vital Sign    Worried About Running Out of Food in the Last Year: Patient unable to answer    Ran Out of Food in the Last Year: Patient unable to answer  Transportation Needs: Patient Unable To Answer (01/27/2023)   PRAPARE - Transportation    Lack of Transportation (Medical): Patient unable to answer    Lack of Transportation (Non-Medical): Patient unable to answer  Physical Activity: Not on file  Stress: Not on file  Social Connections: Not on file  Intimate Partner Violence: Patient Unable To Answer (01/27/2023)   Humiliation, Afraid, Rape, and Kick questionnaire    Fear of Current or Ex-Partner: Patient unable to answer    Emotionally Abused: Patient unable to answer    Physically Abused: Patient unable to answer    Sexually Abused: Patient unable to answer    Medications:   Current Outpatient Medications on File Prior to Visit  Medication Sig Dispense Refill   Cyanocobalamin (B-12 PO) Take by mouth.     ELIQUIS 5 MG TABS tablet TAKE 1 TABLET(5 MG) BY MOUTH TWICE DAILY 60 tablet 1   rosuvastatin (CRESTOR) 20 MG tablet Take 1 tablet (20 mg total) by mouth daily. 30 tablet 1   No current facility-administered medications on file prior to visit.    Allergies:  No Known Allergies  Physical Exam General: well developed, well nourished pleasant elderly Caucasian male, seated, in no evident distress Head: head normocephalic and atraumatic.   Neck: supple with no carotid or supraclavicular bruits Cardiovascular: regular rate and rhythm, no murmurs Musculoskeletal: no deformity Skin:  no rash/petichiae Vascular:  Normal pulses all extremities Vitals:   11/30/23 1409  BP: (!) 165/96  Pulse: 77   Neurologic Exam Mental Status: Awake and fully alert. Oriented to place and time. Recent and remote memory diminished. Attention span, concentration and fund of knowledge diminished mood and affect appropriate.  Mild expressive aphasia with occasional word finding difficulties versus intensity.  Slight impairment of repetition and naming.  Good comprehension.  Occasional paraphasic errors. Cranial Nerves: Fundoscopic exam not done Pupils equal, briskly reactive to light. Extraocular movements full without nystagmus. Visual fields full to confrontation. Hearing slightly diminished bilaterally. Facial sensation intact. Face, tongue, palate moves normally and symmetrically.  Motor: Normal bulk and tone. Normal strength in all tested extremity muscles. Sensory.:  intact to touch ,pinprick .position and vibratory sensation.  Coordination: Rapid alternating movements normal in all extremities. Finger-to-nose and heel-to-shin performed accurately bilaterally. Gait and Station: Arises from chair without difficulty. Stance is normal. Gait demonstrates normal stride length and balance . Able to heel, toe and tandem walk with moderate difficulty.  Reflexes: 1+ and symmetric. Toes downgoing.   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  He is doing remarkably well with only mild residual aphasia.  Vascular risk factors of atrial fibrillation and hyperlipidemia and age only.     PLAN: I had a long d/w patient and his daughter about his recent cardioembolic stroke, chronic atrial fibrillation and  resultant posr stroke aphasia, risk for recurrent stroke/TIAs, personally independently reviewed imaging studies and stroke evaluation results and answered questions.Continue Eliquis (apixaban) daily  for secondary stroke prevention and maintain strict control of hypertension with blood pressure goal below 130/90, diabetes with hemoglobin A1c goal below 6.5% and lipids with LDL cholesterol goal below 70 mg/dL. I also advised the patient to eat a healthy diet with plenty of whole grains, cereals, fruits and vegetables, exercise regularly and maintain ideal body weight check follow-up lipid profile today.     Followup in the future with me in  1 year or call earlier if necessary. Greater than 50% of time during this 35 minute visit was spent on counseling,explanation of diagnosis of atrial fibrillation, risk of recurrent stroke and risk-benefit of anticoagulation, planning of further management, discussion with patient and family and coordination of care Delia Heady, MD Note: This document was prepared with digital dictation and possible smart phrase technology. Any transcriptional errors that result from this process are unintentional

## 2023-11-30 NOTE — Patient Instructions (Signed)
I had a long d/w patient and his daughter about his recent cardioembolic stroke, chronic atrial fibrillation and resultant posr stroke aphasia, risk for recurrent stroke/TIAs, personally independently reviewed imaging studies and stroke evaluation results and answered questions.Continue Eliquis (apixaban) daily  for secondary stroke prevention and maintain strict control of hypertension with blood pressure goal below 130/90, diabetes with hemoglobin A1c goal below 6.5% and lipids with LDL cholesterol goal below 70 mg/dL. I also advised the patient to eat a healthy diet with plenty of whole grains, cereals, fruits and vegetables, exercise regularly and maintain ideal body weight check follow-up lipid profile today.     Followup in the future with me in  1 year or call earlier if necessary.

## 2023-12-07 DIAGNOSIS — M25562 Pain in left knee: Secondary | ICD-10-CM | POA: Diagnosis not present

## 2024-01-18 DIAGNOSIS — D6859 Other primary thrombophilia: Secondary | ICD-10-CM | POA: Diagnosis not present

## 2024-01-18 DIAGNOSIS — I480 Paroxysmal atrial fibrillation: Secondary | ICD-10-CM | POA: Diagnosis not present

## 2024-01-18 DIAGNOSIS — Z7901 Long term (current) use of anticoagulants: Secondary | ICD-10-CM | POA: Diagnosis not present

## 2024-01-18 DIAGNOSIS — N1831 Chronic kidney disease, stage 3a: Secondary | ICD-10-CM | POA: Diagnosis not present

## 2024-01-18 DIAGNOSIS — I77810 Thoracic aortic ectasia: Secondary | ICD-10-CM | POA: Diagnosis not present

## 2024-01-18 DIAGNOSIS — I700 Atherosclerosis of aorta: Secondary | ICD-10-CM | POA: Diagnosis not present

## 2024-01-18 DIAGNOSIS — F01A0 Vascular dementia, mild, without behavioral disturbance, psychotic disturbance, mood disturbance, and anxiety: Secondary | ICD-10-CM | POA: Diagnosis not present

## 2024-03-18 DIAGNOSIS — H40033 Anatomical narrow angle, bilateral: Secondary | ICD-10-CM | POA: Diagnosis not present

## 2024-03-18 DIAGNOSIS — H43393 Other vitreous opacities, bilateral: Secondary | ICD-10-CM | POA: Diagnosis not present

## 2024-04-18 ENCOUNTER — Ambulatory Visit (HOSPITAL_COMMUNITY)
Admission: EM | Admit: 2024-04-18 | Discharge: 2024-04-18 | Disposition: A | Discharge status: Home / Self Care | Attending: Physician Assistant | Admitting: Physician Assistant

## 2024-04-18 ENCOUNTER — Encounter (HOSPITAL_COMMUNITY): Payer: Self-pay

## 2024-04-18 DIAGNOSIS — L03115 Cellulitis of right lower limb: Secondary | ICD-10-CM

## 2024-04-18 DIAGNOSIS — S81801A Unspecified open wound, right lower leg, initial encounter: Secondary | ICD-10-CM

## 2024-04-18 MED ORDER — DOXYCYCLINE HYCLATE 100 MG PO CAPS: 100.0000 mg | ORAL_CAPSULE | Freq: Two times a day (BID) | ORAL | 0 refills | Status: AC

## 2024-04-18 NOTE — ED Provider Notes (Signed)
 [facility]    CSN: [id number] Arrival date & time: 04/18/24  1736      History   Chief Complaint Chief Complaint  Patient presents with   Leg Swelling    HPI Juan [name] is a 89 y.o. male.   Patient here today with daughter for evaluation of swelling to his right lower leg that started over the last few days.  They are unsure what occurred but he has a small wound to the anterior portion of his right lower leg with some clear drainage.  He is currently on Eliquis .  He has not had any fever although the leg is warm to touch.  The history is provided by the patient and a relative.    Past Medical History:  Diagnosis Date   Arrhythmia    Diverticulitis 2004   Hyperlipidemia    Prostate cancer (HCC)    Shingles outbreak    LEFT CHEST WALL    Patient Active Problem List   Diagnosis Date Noted   CVA (cerebral vascular accident) (HCC) 02/01/2023   Acute CVA (cerebrovascular accident) (HCC) 01/28/2023   Acute encephalopathy 01/27/2023   Acidosis 01/27/2023   Stage 3a chronic kidney disease (CKD) (HCC) 01/27/2023   PAF (paroxysmal atrial fibrillation) (HCC)    Hyperlipidemia     Past Surgical History:  Procedure Laterality Date   APPENDECTOMY         Home Medications    Prior to Admission medications   Medication Sig Start Date End Date Taking? Authorizing Provider  doxycycline (VIBRAMYCIN) 100 MG capsule Take 1 capsule (100 mg total) by mouth 2 (two) times daily for 7 days. 04/18/24 04/25/24 Yes Vernestine Gondola, PA-C  Cyanocobalamin (B-12 PO) Take by mouth.    [provider]  ELIQUIS  5 MG TABS tablet TAKE 1 TABLET(5 MG) BY MOUTH TWICE DAILY 06/01/23   Lisabeth Rider, MD  rosuvastatin  (CRESTOR ) 20 MG tablet Take 1 tablet (20 mg total) by mouth daily. 04/04/23   Kirsteins, Cecilia Coe, MD    Family History Family History  Problem Relation Age of Onset   Cancer Sister    Cancer Brother    Stroke Neg Hx     Social History Social  History   Tobacco Use   Smoking status: Never  Substance Use Topics   Alcohol use: Yes    Alcohol/week: 2.0 standard drinks of alcohol    Types: 2 Glasses of wine per week   Drug use: No     Allergies   Patient has no known allergies.   Review of Systems Review of Systems  Constitutional:  Negative for chills and fever.  Eyes:  Negative for discharge and redness.  Respiratory:  Negative for shortness of breath.   Gastrointestinal:  Negative for nausea and vomiting.  Musculoskeletal:  Positive for myalgias.  Skin:  Positive for color change and wound.  Neurological:  Negative for numbness.     Physical Exam Triage Vital Signs ED Triage Vitals [04/18/24 1825]  Encounter Vitals Group     BP 129/82     Girls Systolic BP Percentile      Girls Diastolic BP Percentile      Boys Systolic BP Percentile      Boys Diastolic BP Percentile      Pulse Rate 63     Resp 18     Temp 98.6 F (37 C)     Temp Source Oral     SpO2 98 %  Weight      Height      Head Circumference      Peak Flow      Pain Score 0     Pain Loc      Pain Education      Exclude from Growth Chart    No data found.  Updated Vital Signs BP 129/82 (BP Location: Left Arm)   Pulse 63   Temp 98.6 F (37 C) (Oral)   Resp 18   SpO2 98%   Visual Acuity Right Eye Distance:   Left Eye Distance:   Bilateral Distance:    Right Eye Near:   Left Eye Near:    Bilateral Near:     Physical Exam Vitals and nursing note reviewed.  Constitutional:      General: He is not in acute distress.    Appearance: Normal appearance. He is not ill-appearing.  HENT:     Head: Normocephalic and atraumatic.   Eyes:     Conjunctiva/sclera: Conjunctivae normal.    Cardiovascular:     Rate and Rhythm: Normal rate.  Pulmonary:     Effort: Pulmonary effort is normal. No respiratory distress.   Skin:    Comments: Approximately 1.5 cm ulceration to anterior mid right lower leg with minimal clear drainage, no  bleeding, diffuse swelling and erythema noted to right lower leg   Neurological:     Mental Status: He is alert.   Psychiatric:        Mood and Affect: Mood normal.        Behavior: Behavior normal.        Thought Content: Thought content normal.      UC Treatments / Results  Labs (all labs ordered are listed, but only abnormal results are displayed) Labs Reviewed - No data to display  EKG   Radiology No results found.  Procedures Procedures (including critical care time)  Medications Ordered in UC Medications - No data to display  Initial Impression / Assessment and Plan / UC Course  I have reviewed the triage vital signs and the nursing notes.  Pertinent labs & imaging results that were available during my care of the patient were reviewed by me and considered in my medical decision making (see chart for details).    Given diffuse swelling and erythema concerns for cellulitis.  Will treat with doxycycline.  Low concern for DVT given patient is already on anticoagulant therapy.  Encouraged follow-up if no gradual improvement or with any worsening symptoms.  Final Clinical Impressions(s) / UC Diagnoses   Final diagnoses:  Cellulitis of leg, right  Open leg wound, right, initial encounter   Discharge Instructions   None    ED Prescriptions     Medication Sig Dispense Auth. Provider   doxycycline (VIBRAMYCIN) 100 MG capsule Take 1 capsule (100 mg total) by mouth 2 (two) times daily for 7 days. 14 capsule Vernestine Gondola, PA-C      PDMP not reviewed this encounter.   Vernestine Gondola, PA-C 04/18/24 1932

## 2024-04-18 NOTE — ED Triage Notes (Signed)
 Per daughter, pt hit his RLE on something 2-3 days ago. States having clear drainage, swelling, heat, and red.

## 2024-05-17 ENCOUNTER — Encounter: Payer: Self-pay | Admitting: Adult Health

## 2024-05-17 ENCOUNTER — Ambulatory Visit (INDEPENDENT_AMBULATORY_CARE_PROVIDER_SITE_OTHER): Admitting: Adult Health

## 2024-05-17 VITALS — BP 127/76 | HR 79 | Temp 97.3°F | Resp 18 | Ht 70.0 in | Wt 222.2 lb

## 2024-05-17 DIAGNOSIS — E538 Deficiency of other specified B group vitamins: Secondary | ICD-10-CM | POA: Diagnosis not present

## 2024-05-17 DIAGNOSIS — E785 Hyperlipidemia, unspecified: Secondary | ICD-10-CM

## 2024-05-17 DIAGNOSIS — Z7689 Persons encountering health services in other specified circumstances: Secondary | ICD-10-CM

## 2024-05-17 DIAGNOSIS — I480 Paroxysmal atrial fibrillation: Secondary | ICD-10-CM | POA: Diagnosis not present

## 2024-05-17 DIAGNOSIS — N1831 Chronic kidney disease, stage 3a: Secondary | ICD-10-CM

## 2024-05-17 DIAGNOSIS — B351 Tinea unguium: Secondary | ICD-10-CM

## 2024-05-17 DIAGNOSIS — R7303 Prediabetes: Secondary | ICD-10-CM

## 2024-05-17 DIAGNOSIS — L918 Other hypertrophic disorders of the skin: Secondary | ICD-10-CM

## 2024-05-17 DIAGNOSIS — R221 Localized swelling, mass and lump, neck: Secondary | ICD-10-CM

## 2024-05-17 DIAGNOSIS — E66811 Obesity, class 1: Secondary | ICD-10-CM

## 2024-05-17 DIAGNOSIS — Z8673 Personal history of transient ischemic attack (TIA), and cerebral infarction without residual deficits: Secondary | ICD-10-CM

## 2024-05-17 DIAGNOSIS — Z6831 Body mass index (BMI) 31.0-31.9, adult: Secondary | ICD-10-CM

## 2024-05-17 NOTE — Progress Notes (Unsigned)
 [facility]  Provider:  Jereld Serum DNP  Code Status:  Full Code  Goals of Care:     02/20/2023    1:08 PM  Advanced Directives  Does Patient Have a Medical Advance Directive? Yes  Type of Estate agent of [place];Living will  Does patient want to make changes to medical advance directive? No - Patient declined  Copy of Healthcare Power of Attorney in Chart? No - copy requested     Chief Complaint  Patient presents with   Establish Care    New Patient Med Refill   Discussed the use of AI scribe software for clinical note transcription with the patient, who gave verbal consent to proceed.   HPI: Patient is a 89 y.o. male seen today to establish care.    Past Medical History:  Diagnosis Date   Arrhythmia    Diverticulitis 2004   Hyperlipidemia    Prostate cancer (HCC)    Shingles outbreak    LEFT CHEST WALL   Stroke [facility]) 2024    Past Surgical History:  Procedure Laterality Date   APPENDECTOMY      No Known Allergies  Outpatient Encounter Medications as of 05/17/2024  Medication Sig   Cyanocobalamin (B-12 PO) Take by mouth.   ELIQUIS  5 MG TABS tablet TAKE 1 TABLET(5 MG) BY MOUTH TWICE DAILY   rosuvastatin  (CRESTOR ) 20 MG tablet Take 1 tablet (20 mg total) by mouth daily.   No facility-administered encounter medications on file as of 05/17/2024.    Review of Systems:  Review of Systems  Constitutional:  Negative for activity change, appetite change and fever.  HENT:  Negative for sore throat.   Eyes: Negative.   Cardiovascular:  Negative for chest pain and leg swelling.  Gastrointestinal:  Negative for abdominal distention, diarrhea and vomiting.  Genitourinary:  Negative for dysuria, frequency and urgency.  Skin:  Negative for color change.  Neurological:  Negative for dizziness and headaches.  Psychiatric/Behavioral:  Negative for behavioral problems and sleep disturbance. The patient is not nervous/anxious.      Health Maintenance  Topic Date Due   DTaP/Tdap/Td (1 - Tdap) Never done   Zoster Vaccines- Shingrix (1 of 2) Never done   Medicare Annual Wellness (AWV)  12/25/2019   COVID-19 Vaccine (2 - 2024-25 season) 06/07/2024 (Originally 03/06/2024)   INFLUENZA VACCINE  06/07/2024   Pneumococcal Vaccine: 50+ Years  Completed   Hepatitis B Vaccines  Aged Out   HPV VACCINES  Aged Out   Meningococcal B Vaccine  Aged Out    Physical Exam: Vitals:   05/17/24 0954  BP: 127/76  Pulse: 79  Resp: 18  Temp: (!) 97.3 F (36.3 C)  SpO2: 96%  Weight: 222 lb 3.2 oz (100.8 kg)  Height: 5' 10 (1.778 m)   Body mass index is 31.88 kg/m. Physical Exam Constitutional:      Appearance: Normal appearance.  HENT:     Head: Normocephalic and atraumatic.     Mouth/Throat:     Mouth: Mucous membranes are moist.  Eyes:     Conjunctiva/sclera: Conjunctivae normal.  Cardiovascular:     Rate and Rhythm: Normal rate. Rhythm irregular.     Pulses: Normal pulses.     Heart sounds: Normal heart sounds.  Pulmonary:     Effort: Pulmonary effort is normal.     Breath sounds: Normal breath sounds.  Abdominal:     General: Bowel sounds are normal.     Palpations: Abdomen is soft.  Musculoskeletal:        General: No swelling. Normal range of motion.     Cervical back: Normal range of motion.  Skin:    General: Skin is warm and dry.  Neurological:     General: No focal deficit present.     Mental Status: He is alert and oriented to person, place, and time.     Comments: Mass on right neck, 4 X 3 cm, left neck skin tag 1 cm  Psychiatric:        Mood and Affect: Mood normal.        Behavior: Behavior normal.        Thought Content: Thought content normal.        Judgment: Judgment normal.     Labs reviewed: Basic Metabolic Panel: No results for input(s): NA, K, CL, CO2, GLUCOSE, BUN, CREATININE, CALCIUM , MG, PHOS, TSH in the last 8760 hours. Liver Function Tests: No results  for input(s): AST, ALT, ALKPHOS, BILITOT, PROT, ALBUMIN in the last 8760 hours. No results for input(s): LIPASE, AMYLASE in the last 8760 hours. No results for input(s): AMMONIA in the last 8760 hours. CBC: No results for input(s): WBC, NEUTROABS, HGB, HCT, MCV, PLT in the last 8760 hours. Lipid Panel: No results for input(s): CHOL, HDL, LDLCALC, TRIG, CHOLHDL, LDLDIRECT in the last 8760 hours. Lab Results  Component Value Date   HGBA1C 5.9 (H) 01/28/2023    Procedures since last visit: No results found.  Assessment/Plan      Labs/tests ordered:   Vitamin B 12 level, CBC, CMP, A1C , lipid panel   Return in about 2 weeks (around 05/31/2024).  Juan Wilkerson Medina-Vargas, NP

## 2024-05-18 LAB — COMPREHENSIVE METABOLIC PANEL WITH GFR
AG Ratio: 1.4 (calc) (ref 1.0–2.5)
ALT: 16 U/L (ref 9–46)
AST: 17 U/L (ref 10–35)
Albumin: 3.8 g/dL (ref 3.6–5.1)
Alkaline phosphatase (APISO): 78 U/L (ref 35–144)
BUN/Creatinine Ratio: 16 (calc) (ref 6–22)
BUN: 21 mg/dL (ref 7–25)
CO2: 24 mmol/L (ref 20–32)
Calcium: 8.9 mg/dL (ref 8.6–10.3)
Chloride: 105 mmol/L (ref 98–110)
Creat: 1.33 mg/dL — ABNORMAL HIGH (ref 0.70–1.22)
Globulin: 2.8 g/dL (ref 1.9–3.7)
Glucose, Bld: 90 mg/dL (ref 65–99)
Potassium: 4.6 mmol/L (ref 3.5–5.3)
Sodium: 139 mmol/L (ref 135–146)
Total Bilirubin: 0.6 mg/dL (ref 0.2–1.2)
Total Protein: 6.6 g/dL (ref 6.1–8.1)
eGFR: 49 mL/min/1.73m2 — ABNORMAL LOW (ref >=60)

## 2024-05-18 LAB — HEMOGLOBIN A1C
Hgb A1c MFr Bld: 6.3 % — ABNORMAL HIGH (ref <5.7)
Mean Plasma Glucose: 134 mg/dL
eAG (mmol/L): 7.4 mmol/L

## 2024-05-18 LAB — CBC WITH DIFFERENTIAL/PLATELET
Absolute Lymphocytes: 1265 {cells}/uL (ref 850–3900)
Absolute Monocytes: 1110 {cells}/uL — ABNORMAL HIGH (ref 200–950)
Basophils Absolute: 191 {cells}/uL (ref 0–200)
Basophils Relative: 2.1 %
Eosinophils Absolute: 428 {cells}/uL (ref 15–500)
Eosinophils Relative: 4.7 %
HCT: 40.4 % (ref 38.5–50.0)
Hemoglobin: 13.4 g/dL (ref 13.2–17.1)
MCH: 34.9 pg — ABNORMAL HIGH (ref 27.0–33.0)
MCHC: 33.2 g/dL (ref 32.0–36.0)
MCV: 105.2 fL — ABNORMAL HIGH (ref 80.0–100.0)
MPV: 9.5 fL (ref 7.5–12.5)
Monocytes Relative: 12.2 %
Neutro Abs: 6106 {cells}/uL (ref 1500–7800)
Neutrophils Relative %: 67.1 %
Platelets: 272 Thousand/uL (ref 140–400)
RBC: 3.84 Million/uL — ABNORMAL LOW (ref 4.20–5.80)
RDW: 12.3 % (ref 11.0–15.0)
Total Lymphocyte: 13.9 %
WBC: 9.1 Thousand/uL (ref 3.8–10.8)

## 2024-05-18 LAB — LIPID PANEL
Cholesterol: 131 mg/dL (ref <200)
HDL: 62 mg/dL (ref >=40)
LDL Cholesterol (Calc): 55 mg/dL
Non-HDL Cholesterol (Calc): 69 mg/dL (ref <130)
Total CHOL/HDL Ratio: 2.1 (calc) (ref <5.0)
Triglycerides: 59 mg/dL (ref <150)

## 2024-05-18 LAB — VITAMIN B12: Vitamin B-12: 1009 pg/mL (ref 200–1100)

## 2024-05-22 ENCOUNTER — Ambulatory Visit: Payer: Self-pay | Admitting: Adult Health

## 2024-05-22 NOTE — Progress Notes (Signed)
-     No anemia -   GFR 49, ranging as-chronic kidney disease stage IIIa, same as last year -   Liver enzymes, lipid panel, vitamin B12, all normal -   A1c 6.3, up from 5.9, ranging as prediabetic, recommending low-carb diet

## 2024-06-06 ENCOUNTER — Ambulatory Visit (INDEPENDENT_AMBULATORY_CARE_PROVIDER_SITE_OTHER): Admitting: Adult Health

## 2024-06-06 ENCOUNTER — Encounter: Payer: Self-pay | Admitting: Adult Health

## 2024-06-06 ENCOUNTER — Encounter: Payer: Self-pay | Admitting: Podiatry

## 2024-06-06 ENCOUNTER — Ambulatory Visit: Admitting: Podiatry

## 2024-06-06 VITALS — BP 126/84 | HR 90 | Temp 98.2°F | Ht 70.0 in | Wt 218.8 lb

## 2024-06-06 DIAGNOSIS — B351 Tinea unguium: Secondary | ICD-10-CM

## 2024-06-06 DIAGNOSIS — Z8673 Personal history of transient ischemic attack (TIA), and cerebral infarction without residual deficits: Secondary | ICD-10-CM | POA: Diagnosis not present

## 2024-06-06 DIAGNOSIS — M79671 Pain in right foot: Secondary | ICD-10-CM

## 2024-06-06 DIAGNOSIS — I480 Paroxysmal atrial fibrillation: Secondary | ICD-10-CM

## 2024-06-06 DIAGNOSIS — R7303 Prediabetes: Secondary | ICD-10-CM

## 2024-06-06 DIAGNOSIS — N1831 Chronic kidney disease, stage 3a: Secondary | ICD-10-CM

## 2024-06-06 DIAGNOSIS — L600 Ingrowing nail: Secondary | ICD-10-CM | POA: Diagnosis not present

## 2024-06-06 DIAGNOSIS — M79672 Pain in left foot: Secondary | ICD-10-CM | POA: Diagnosis not present

## 2024-06-06 DIAGNOSIS — E785 Hyperlipidemia, unspecified: Secondary | ICD-10-CM

## 2024-06-06 NOTE — Progress Notes (Signed)
 [facility] clinic  Provider:  Jereld Serum DNP  Code Status:  Full Code  Goals of Care:     02/20/2023    1:08 PM  Advanced Directives  Does Patient Have a Medical Advance Directive? Yes  Type of Estate agent of [place];Living will  Does patient want to make changes to medical advance directive? No - Patient declined  Copy of Healthcare Power of Attorney in Chart? No - copy requested     Chief Complaint  Patient presents with   Follow-up    2 week follow up Patient is concerns about swelling in both legs. Patient would like to discuss lab results    Discussed the use of AI scribe software for clinical note transcription with the patient, who gave verbal consent to proceed.  HPI: Patient is a 89 y.o. male seen today for a 2-week follow up of chronic medical issues. He was accompanied by his daughter, who is his primary caregiver.  Recent blood work shows a normal vitamin B12 level at 1009. His A1c has increased from 5.9 a year ago to 6.3; he remains in the prediabetes range. Kidney function tests reveal a GFR of 49, ranging as stage 3A chronic kidney disease. Electrolytes and liver enzymes are normal, and he is not anemic. Cholesterol levels are well-controlled.  He has a history of atrial fibrillation and is currently taking Eliquis  5 mg twice daily. No falls have been reported. He also takes rosuvastatin  20 mg daily for hyperlipidemia.  He has a history of stroke, which affects his speech, occasionally causing him to use incorrect words. He attempts to correct himself and remains alert and oriented.  He experiences occasional leg swelling, which he manages by elevating his legs at night. The swelling is equal in both legs and has decreased recently. His skin is dry.  He has lost weight over the past year, from 230 pounds in January to 218 pounds currently. He reports regular bowel movements without blood and sleeps well at night, taking naps during the  day.  He does not smoke or drink alcohol, except for occasional wine with dinner. 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 He currently walks on a treadmill indoors due to knee issues and hot weather, covering about 300 feet per session.   He is wearing a left knee brace.  Past Medical History:  Diagnosis Date   Arrhythmia    Diverticulitis 2004   Hyperlipidemia    Prostate cancer (HCC)    Shingles outbreak    LEFT CHEST WALL   Stroke [facility]) 2024    Past Surgical History:  Procedure Laterality Date   APPENDECTOMY      No Known Allergies  Outpatient Encounter Medications as of 06/06/2024  Medication Sig   Cyanocobalamin (B-12 PO) Take by mouth.   ELIQUIS  5 MG TABS tablet TAKE 1 TABLET(5 MG) BY MOUTH TWICE DAILY   rosuvastatin  (CRESTOR ) 20 MG tablet Take 1 tablet (20 mg total) by mouth daily.   No facility-administered encounter medications on file as of 06/06/2024.    Review of Systems:  Review of Systems  Constitutional:  Negative for activity change, appetite change and fever.  HENT:  Negative for sore throat.   Eyes: Negative.   Cardiovascular:  Negative for chest pain and leg swelling.  Gastrointestinal:  Negative for abdominal distention, diarrhea and vomiting.  Genitourinary:  Negative for dysuria, frequency and urgency.  Skin:  Negative for color change.  Neurological:  Negative for dizziness and headaches.  Psychiatric/Behavioral:  Negative for behavioral problems and sleep disturbance. The patient is not nervous/anxious.     Health Maintenance  Topic Date Due   DTaP/Tdap/Td (1 - Tdap) Never done   Zoster Vaccines- Shingrix (1 of 2) Never done   Medicare Annual Wellness (AWV)  12/25/2019   COVID-19 Vaccine (2 - Moderna risk series) 10/05/2023   INFLUENZA VACCINE  06/07/2024   Pneumococcal Vaccine: 50+ Years  Completed   Hepatitis B Vaccines  Aged Out   HPV VACCINES  Aged Out    Meningococcal B Vaccine  Aged Out    Physical Exam: Vitals:   06/06/24 1515  BP: 126/84  Pulse: 90  Temp: 98.2 F (36.8 C)  SpO2: 96%  Weight: 218 lb 12.8 oz (99.2 kg)  Height: 5' 10 (1.778 m)   Body mass index is 31.39 kg/m. Physical Exam Constitutional:      Appearance: Normal appearance.  HENT:     Head: Normocephalic and atraumatic.     Mouth/Throat:     Mouth: Mucous membranes are moist.  Eyes:     Conjunctiva/sclera: Conjunctivae normal.  Cardiovascular:     Rate and Rhythm: Normal rate and regular rhythm.     Pulses: Normal pulses.     Heart sounds: Normal heart sounds.  Pulmonary:     Effort: Pulmonary effort is normal.     Breath sounds: Normal breath sounds.  Abdominal:     General: Bowel sounds are normal.     Palpations: Abdomen is soft.  Musculoskeletal:        General: No swelling. Normal range of motion.     Cervical back: Normal range of motion.  Skin:    General: Skin is warm and dry.     Comments: Lump on right neck and skin tag on left neck  Neurological:     General: No focal deficit present.     Mental Status: He is alert and oriented to person, place, and time.  Psychiatric:        Mood and Affect: Mood normal.        Behavior: Behavior normal.        Thought Content: Thought content normal.        Judgment: Judgment normal.     Labs reviewed: Basic Metabolic Panel: Recent Labs    05/17/24 1040  NA 139  K 4.6  CL 105  CO2 24  GLUCOSE 90  BUN 21  CREATININE 1.33*  CALCIUM  8.9   Liver Function Tests: Recent Labs    05/17/24 1040  AST 17  ALT 16  BILITOT 0.6  PROT 6.6   No results for input(s): LIPASE, AMYLASE in the last 8760 hours. No results for input(s): AMMONIA in the last 8760 hours. CBC: Recent Labs    05/17/24 1040  WBC 9.1  NEUTROABS 6,106  HGB 13.4  HCT 40.4  MCV 105.2*  PLT 272   Lipid Panel: Recent Labs    05/17/24 1040  CHOL 131  HDL 62  LDLCALC 55  TRIG 59  CHOLHDL 2.1   Lab  Results  Component Value Date   HGBA1C 6.3 (H) 05/17/2024    Procedures since last visit: No results found.  Assessment/Plan  1. Stage 3a chronic kidney disease (CKD) (HCC) (Primary) -  GFR consistent with stage 3A. No acute changes.  2. PAF (paroxysmal atrial fibrillation) (HCC) -  Well-managed with no recent falls. - Continue Eliquis  5  mg twice daily.  3. History of CVA (cerebrovascular accident) -  Speech occasionally affected with wrong word usage. Alert and oriented. - Continue Eliquis  5 mg twice a day -   Continue rosuvastatin  20 mg daily  4. Prediabetes -  A1c increased from 5.9 to 6.3, indicating slight progression. -  diet-controlled  5. Hyperlipidemia, unspecified hyperlipidemia type Lab Results  Component Value Date   CHOL 131 05/17/2024   HDL 62 05/17/2024   LDLCALC 55 05/17/2024   TRIG 59 05/17/2024   CHOLHDL 2.1 05/17/2024    -  Well-controlled with normal lipid levels. - Continue rosuvastatin  20 mg daily.    Labs/tests ordered:  None   Return in about 3 months (around 09/06/2024).  Keyonda Bickle Medina-Vargas, NP

## 2024-06-06 NOTE — Patient Instructions (Signed)
   Needs to schedule Annual Wellness Visit.  Needs TDaP and Zoster (Shngles0 vaccine

## 2024-06-06 NOTE — Progress Notes (Signed)
 Patient presents for evaluation and treatment of tenderness and some redness around nails feet.  Tenderness around toes with walking and wearing shoes.  Physical exam:  General appearance: Alert, pleasant, and in no acute distress.  Vascular: Pedal pulses: DP 2/4 B/L, PT 1/4 B/L.  Moderate edema lower legs bilaterally.  Capillary refill time 1 through 5  Neurological:  Light touch intact bilaterally.  Achilles tendon reflex normal bilaterally  Dermatologic:  Nails thickened, disfigured, discolored 1-5 BL with subungual debris.  Redness and hypertrophic nail folds along nail folds bilaterally but no signs of drainage or infection.  Third nail right is ingrown with some mild bleeding along the edges of the nail distal tip of the toe from the nail digging in.  Very superficial wound present.  No signs of infection  Musculoskeletal:  Hammertoes 2 through 5 bilaterally   Diagnosis: 1. Painful onychomycotic nails 1 through 5 bilaterally. 2. Pain toes 1 through 5 bilaterally. 3.  Ingrown nail third toe right  Plan: -New patient office visit level 3 for evaluation and management.  Modifier 25 - Discussed that third toe recommended disclaimed with some soapy water once or twice a day and apply some antibiotic ointment to it.  It should be fine it looks like it still getting irritated or bleeding or signs of infection and call us  for an appointment.  If it does become more problematic.  We can do a matrixectomy on the nail -Debrided onychomycotic nails 1 through 5 bilaterally.  Return 3 months RFC

## 2024-06-23 ENCOUNTER — Ambulatory Visit (HOSPITAL_COMMUNITY)
Admission: EM | Admit: 2024-06-23 | Discharge: 2024-06-23 | Disposition: A | Discharge status: Home / Self Care | Attending: Physician Assistant | Admitting: Physician Assistant

## 2024-06-23 ENCOUNTER — Encounter (HOSPITAL_COMMUNITY): Payer: Self-pay

## 2024-06-23 DIAGNOSIS — B029 Zoster without complications: Secondary | ICD-10-CM | POA: Diagnosis not present

## 2024-06-23 MED ORDER — VALACYCLOVIR HCL 1 G PO TABS
1000.0000 mg | ORAL_TABLET | Freq: Three times a day (TID) | ORAL | 0 refills | Status: AC
Start: 2024-06-23 — End: 2024-06-30

## 2024-06-23 NOTE — ED Provider Notes (Signed)
 [facility]    CSN: [id number] Arrival date & time: 06/23/24  1002      History   Chief Complaint Chief Complaint  Patient presents with   Rash    HPI Juan [name] is a 89 y.o. male.   Patient here today for evaluation of rash few days ago.  He believes he may have shingles.  He does state symptoms feel similar to prior shingles outbreak.  He has tried calamine and hydrocortisone without improvement.  He denies any fever, nausea, vomiting.  The history is provided by the patient.  Rash Associated symptoms: no fever and no shortness of breath     Past Medical History:  Diagnosis Date   Arrhythmia    Diverticulitis 2004   Hyperlipidemia    Prostate cancer (HCC)    Shingles outbreak    LEFT CHEST WALL   Stroke [facility]) 2024    Patient Active Problem List   Diagnosis Date Noted   CVA (cerebral vascular accident) (HCC) 02/01/2023   Acute CVA (cerebrovascular accident) (HCC) 01/28/2023   Acute encephalopathy 01/27/2023   Acidosis 01/27/2023   Stage 3a chronic kidney disease (CKD) (HCC) 01/27/2023   PAF (paroxysmal atrial fibrillation) (HCC)    Hyperlipidemia     Past Surgical History:  Procedure Laterality Date   APPENDECTOMY         Home Medications    Prior to Admission medications   Medication Sig Start Date End Date Taking? Authorizing Provider  ELIQUIS  5 MG TABS tablet TAKE 1 TABLET(5 MG) BY MOUTH TWICE DAILY 06/01/23  Yes Sethi, Pramod S, MD  rosuvastatin  (CRESTOR ) 20 MG tablet Take 1 tablet (20 mg total) by mouth daily. 04/04/23  Yes Kirsteins, Prentice FORBES, MD  valACYclovir  (VALTREX ) 1000 MG tablet Take 1 tablet (1,000 mg total) by mouth 3 (three) times daily for 7 days. 06/23/24 06/30/24 Yes Billy Asberry FALCON, PA-C  Cyanocobalamin (B-12 PO) Take by mouth.    [provider]    Family History Family History  Problem Relation Age of Onset   Cancer Sister    Cancer Brother    Stroke Neg Hx     Social History Social History    Tobacco Use   Smoking status: Never  Substance Use Topics   Alcohol use: Yes    Alcohol/week: 2.0 standard drinks of alcohol    Types: 2 Glasses of wine per week   Drug use: No     Allergies   Patient has no known allergies.   Review of Systems Review of Systems  Constitutional:  Negative for chills and fever.  Eyes:  Negative for discharge and redness.  Respiratory:  Negative for shortness of breath.   Skin:  Positive for rash.  Neurological:  Negative for numbness.     Physical Exam Triage Vital Signs ED Triage Vitals  Encounter Vitals Group     BP 06/23/24 1031 125/89     Girls Systolic BP Percentile --      Girls Diastolic BP Percentile --      Boys Systolic BP Percentile --      Boys Diastolic BP Percentile --      Pulse Rate 06/23/24 1031 98     Resp 06/23/24 1031 18     Temp 06/23/24 1031 98.2 F (36.8 C)     Temp Source 06/23/24 1031 Oral     SpO2 06/23/24 1031 93 %     Weight --      Height 06/23/24 1031 5' 10 (  1.778 m)     Head Circumference --      Peak Flow --      Pain Score 06/23/24 1030 0     Pain Loc --      Pain Education --      Exclude from Growth Chart --    No data found.  Updated Vital Signs BP 125/89 (BP Location: Right Arm)   Pulse 98   Temp 98.2 F (36.8 C) (Oral)   Resp 18   Ht 5' 10 (1.778 m)   SpO2 93%   BMI 31.39 kg/m   Visual Acuity Right Eye Distance:   Left Eye Distance:   Bilateral Distance:    Right Eye Near:   Left Eye Near:    Bilateral Near:     Physical Exam Vitals and nursing note reviewed.  Constitutional:      General: He is not in acute distress.    Appearance: Normal appearance. He is not ill-appearing.  HENT:     Head: Normocephalic and atraumatic.  Eyes:     Conjunctiva/sclera: Conjunctivae normal.  Cardiovascular:     Rate and Rhythm: Normal rate.  Pulmonary:     Effort: Pulmonary effort is normal. No respiratory distress.  Skin:    Comments: Erythematous vesicular eruption  following dermatomal pattern to left abdomen, not crossing midline  Neurological:     Mental Status: He is alert.  Psychiatric:        Mood and Affect: Mood normal.        Behavior: Behavior normal.        Thought Content: Thought content normal.      UC Treatments / Results  Labs (all labs ordered are listed, but only abnormal results are displayed) Labs Reviewed - No data to display  EKG   Radiology No results found.  Procedures Procedures (including critical care time)  Medications Ordered in UC Medications - No data to display  Initial Impression / Assessment and Plan / UC Course  I have reviewed the triage vital signs and the nursing notes.  Pertinent labs & imaging results that were available during my care of the patient were reviewed by me and considered in my medical decision making (see chart for details).    Presentation seems consistent with shingles.  Will treat with Valtrex  and advised follow-up with primary care.  Patient reportsthat he plans to do this tomorrow.  Advised continued topical treatment if needed and follow-up if no gradual improvement or with any further concerns.  Final Clinical Impressions(s) / UC Diagnoses   Final diagnoses:  Herpes zoster without complication   Discharge Instructions   None    ED Prescriptions     Medication Sig Dispense Auth. Provider   valACYclovir  (VALTREX ) 1000 MG tablet Take 1 tablet (1,000 mg total) by mouth 3 (three) times daily for 7 days. 21 tablet Billy Asberry FALCON, PA-C      PDMP not reviewed this encounter.   Billy Asberry FALCON, PA-C 06/23/24 1054

## 2024-06-23 NOTE — ED Triage Notes (Signed)
 Rash onset a few days ago on the stomach. Patient does have a history of shingles and states this feels the same.   Tried calamine and hydrocortisone cream with no relief.

## 2024-06-24 ENCOUNTER — Encounter: Payer: Self-pay | Admitting: Adult Health

## 2024-06-24 ENCOUNTER — Ambulatory Visit (INDEPENDENT_AMBULATORY_CARE_PROVIDER_SITE_OTHER): Admitting: Adult Health

## 2024-06-24 VITALS — BP 110/62 | HR 62 | Temp 97.7°F | Resp 18 | Ht 70.0 in | Wt 223.4 lb

## 2024-06-24 DIAGNOSIS — B028 Zoster with other complications: Secondary | ICD-10-CM | POA: Diagnosis not present

## 2024-06-24 DIAGNOSIS — Z8673 Personal history of transient ischemic attack (TIA), and cerebral infarction without residual deficits: Secondary | ICD-10-CM

## 2024-06-24 MED ORDER — VITAMIN C-ROSE HIPS 500 MG PO TABS: 500.0000 mg | ORAL_TABLET | Freq: Every day | ORAL | Status: AC

## 2024-06-24 MED ORDER — ROSUVASTATIN CALCIUM 20 MG PO TABS: 20.0000 mg | ORAL_TABLET | Freq: Every day | ORAL | 1 refills | Status: AC

## 2024-06-24 MED ORDER — APIXABAN 5 MG PO TABS: 5.0000 mg | ORAL_TABLET | Freq: Two times a day (BID) | ORAL | 1 refills | Status: AC

## 2024-06-24 NOTE — Progress Notes (Signed)
 [facility]  Provider:  Jereld Serum DNP  Code Status:  Full Code  Goals of Care:     02/20/2023    1:08 PM  Advanced Directives  Does Patient Have a Medical Advance Directive? Yes  Type of Estate agent of [place];Living will  Does patient want to make changes to medical advance directive? No - Patient declined  Copy of Healthcare Power of Attorney in Chart? No - copy requested     Chief Complaint  Patient presents with   Hospitalization Follow-up    Went to [facility] for shingles, but spot on right arm , is not shingles per uc provider    Discussed the use of AI scribe software for clinical note transcription with the patient, who gave verbal consent to proceed.  HPI: Patient is a 89 y.o. male who presents for a follow-up visit after being treated for shingles. He is accompanied by his daughter, who is his primary caregiver.  He was treated in the emergency room on June 23, 2024, for shingles, which began with a rash on the left side of his abdomen around June 20, 2024. The rash was initially painful and itchy but has started to dry up. He has been applying calamine lotion to the affected area. He is currently on the second day of valacyclovir  1000 mg three times a day for a seven-day course.  He has a history of a previous shingles episode several years ago, initially mistaken for a heart attack due to its location on his back. His daughter noted that this episode was caught early and promptly treated.  He also has itchy spots on his arms and has been using hydrocortisone cream on these areas.  His past medical history includes a stroke, for which he takes Eliquis  5 mg twice a day and rosuvastatin  20 mg daily. His daughter manages his medications.  He reports a good appetite and no new symptoms other than those related to the shingles and mosquito bites. He lives with his daughter, who is his primary caregiver.    Past Medical History:   Diagnosis Date   Arrhythmia    Diverticulitis 2004   Hyperlipidemia    Prostate cancer (HCC)    Shingles outbreak    LEFT CHEST WALL   Stroke (HCC) 2024    Past Surgical History:  Procedure Laterality Date   APPENDECTOMY      No Known Allergies  Outpatient Encounter Medications as of 06/24/2024  Medication Sig   Ascorbic Acid (VITAMIN C  WITH ROSE HIPS) 500 MG tablet Take 1 tablet (500 mg total) by mouth daily.   Cyanocobalamin (B-12 PO) Take by mouth.   valACYclovir  (VALTREX ) 1000 MG tablet Take 1 tablet (1,000 mg total) by mouth 3 (three) times daily for 7 days.   [DISCONTINUED] ELIQUIS  5 MG TABS tablet TAKE 1 TABLET(5 MG) BY MOUTH TWICE DAILY   [DISCONTINUED] rosuvastatin  (CRESTOR ) 20 MG tablet Take 1 tablet (20 mg total) by mouth daily.   apixaban  (ELIQUIS ) 5 MG TABS tablet Take 1 tablet (5 mg total) by mouth 2 (two) times daily.   rosuvastatin  (CRESTOR ) 20 MG tablet Take 1 tablet (20 mg total) by mouth daily.   No facility-administered encounter medications on file as of 06/24/2024.    Review of Systems:  Review of Systems  Constitutional:  Negative for activity change, appetite change and fever.  HENT:  Negative for sore throat.   Eyes: Negative.   Cardiovascular:  Negative for chest pain and leg swelling.  Gastrointestinal:  Negative for abdominal distention, diarrhea and vomiting.  Genitourinary:  Negative for dysuria, frequency and urgency.  Skin:  Positive for rash. Negative for color change.  Neurological:  Negative for dizziness and headaches.  Psychiatric/Behavioral:  Negative for behavioral problems and sleep disturbance. The patient is not nervous/anxious.     Health Maintenance  Topic Date Due   DTaP/Tdap/Td (1 - Tdap) Never done   Zoster Vaccines- Shingrix (1 of 2) Never done   Medicare Annual Wellness (AWV)  12/25/2019   INFLUENZA VACCINE  07/08/2024 (Originally 06/07/2024)   COVID-19 Vaccine (2 - Moderna risk series) 07/08/2024 (Originally 10/05/2023)    Pneumococcal Vaccine: 50+ Years  Completed   HPV VACCINES  Aged Out   Meningococcal B Vaccine  Aged Out    Physical Exam: Vitals:   06/24/24 1130  BP: 110/62  Pulse: 62  Resp: 18  Temp: 97.7 F (36.5 C)  SpO2: 96%  Weight: 223 lb 6.4 oz (101.3 kg)  Height: 5' 10 (1.778 m)   Body mass index is 32.05 kg/m. Physical Exam Constitutional:      Appearance: He is obese.  HENT:     Head: Normocephalic and atraumatic.     Mouth/Throat:     Mouth: Mucous membranes are moist.  Eyes:     Conjunctiva/sclera: Conjunctivae normal.  Cardiovascular:     Rate and Rhythm: Normal rate and regular rhythm.     Pulses: Normal pulses.     Heart sounds: Normal heart sounds.  Pulmonary:     Effort: Pulmonary effort is normal.     Breath sounds: Normal breath sounds.  Abdominal:     General: Bowel sounds are normal.     Palpations: Abdomen is soft.  Musculoskeletal:        General: No swelling. Normal range of motion.     Cervical back: Normal range of motion.     Comments: Has left knee brace  Skin:    General: Skin is warm and dry.     Comments: Dry rashes on left abdomen  Neurological:     General: No focal deficit present.     Mental Status: He is alert and oriented to person, place, and time.  Psychiatric:        Mood and Affect: Mood normal.        Behavior: Behavior normal.        Thought Content: Thought content normal.        Judgment: Judgment normal.     Labs reviewed: Basic Metabolic Panel: Recent Labs    05/17/24 1040  NA 139  K 4.6  CL 105  CO2 24  GLUCOSE 90  BUN 21  CREATININE 1.33*  CALCIUM  8.9   Liver Function Tests: Recent Labs    05/17/24 1040  AST 17  ALT 16  BILITOT 0.6  PROT 6.6   No results for input(s): LIPASE, AMYLASE in the last 8760 hours. No results for input(s): AMMONIA in the last 8760 hours. CBC: Recent Labs    05/17/24 1040  WBC 9.1  NEUTROABS 6,106  HGB 13.4  HCT 40.4  MCV 105.2*  PLT 272   Lipid  Panel: Recent Labs    05/17/24 1040  CHOL 131  HDL 62  LDLCALC 55  TRIG 59  CHOLHDL 2.1   Lab Results  Component Value Date   HGBA1C 6.3 (H) 05/17/2024    Procedures since last visit: No results found.  Assessment/Plan  1. Herpes zoster with complication (Primary) -  Herpes  zoster on the left abdomen, drying up with no significant blistering. Treated with valacyclovir . No complications noted. - Continue valacyclovir  1000 mg three times a day for seven days. - Apply calamine lotion to the affected area. - Avoid touching the affected area and then touching the eyes. - Advise against taking a hot shower; use warm or cool water instead. - Educated on maintaining good nutrition for immune support. - Delay shingles vaccination for three months.  2. History of CVA (cerebrovascular accident) -   no current weakness or complications. On Eliquis  for anticoagulation. - continue Rosuvastatin  20 mg daily - Advise to maintain current medication regimen. - apixaban  (ELIQUIS ) 5 MG TABS tablet; Take 1 tablet (5 mg total) by mouth 2 (two) times daily.  Dispense: 180 tablet; Refill: 1 - rosuvastatin  (CRESTOR ) 20 MG tablet; Take 1 tablet (20 mg total) by mouth daily.  Dispense: 90 tablet; Refill: 1     General Health Maintenance Discussed general health maintenance including vaccinations and immune support. - Delay Tdap vaccination until after recovery from shingles. - Start vitamin C  500 mg daily. - Encourage adequate hydration.      Labs/tests ordered:   None   Return if symptoms worsen or fail to improve.  Juan Mathews Medina-Vargas, NP

## 2024-06-24 NOTE — Patient Instructions (Addendum)
 Our records indicate you are due for an annual wellness visit, stop at check out to schedule (video or in-person).    Shingles  Shingles, or herpes zoster, is an infection. It gives you a skin rash and blisters. These infected areas may hurt a lot. Shingles only happens if: You've had chickenpox. You've been given a shot called a vaccine to protect you from getting chickenpox. Shingles is rare in this case. What are the causes? Shingles is caused by a germ called the varicella-zoster virus. This is the same germ that causes chickenpox. After you're exposed to the germ, it stays in your body but is dormant. This means it isn't active. Shingles happens if the germ becomes active again. This can happen years after you're first exposed to the germ. What increases the risk? You may be more likely to get shingles if: You're older than 88 years of age. You're under a lot of stress. You have a weak immune system. The immune system is your body's defense system. It may be weak if: You have human immunodeficiency virus (HIV). You have acquired immunodeficiency syndrome (AIDS). You have cancer. You take medicines that weaken your immune system. These include organ transplant medicines. What are the signs or symptoms? The first symptoms of shingles may be itching, tingling, or pain. Your skin may feel like it's burning. A few days or weeks later, you'll get a rash. Here's what you can expect: The rash is likely to be on one side of your body. The rash may be shaped like a belt or a band. Over time, it will turn into blisters filled with fluid. The blisters will break open and change into scabs. The scabs will dry up in about 2-3 weeks. You may also have: A fever. Chills. A headache. Nausea. How is this diagnosed? Shingles is diagnosed with a skin exam. A sample called a culture may be taken from one of your blisters and sent to a lab. This will show if you have shingles. How is this  treated? The rash may last for several weeks. There's no cure for shingles, but your health care provider may give you medicines. These medicines may: Help with pain. Help with itching. Help with irritation and swelling. Help you get better sooner. Help to prevent long-term problems. If the rash is on your face, you may need to see an eye doctor or an ear, nose, and throat (ENT) doctor. Follow these instructions at home: Medicines Take your medicines only as told by your provider. Put an anti-itch cream or numbing cream on the rash or blisters as told by your provider. Relieving itching and discomfort  To help with itching: Put cold, wet cloths called cold compresses on the rash or blisters. Take a cool bath. Try adding baking soda or dry oatmeal to the water. Do not bathe in hot water. Use calamine lotion on the rash or blisters. You can get this type of lotion at the store. Blister and rash care Keep your rash covered with a loose bandage. Wear loose clothes that don't rub on your rash. Take care of your rash as told by your provider. Make sure you: Wash your hands with soap and water for at least 20 seconds before and after you change your bandage. If you can't use soap and water, use hand sanitizer. Keep your rash and blisters clean by washing them with mild soap and cool water. Change your bandage. Check your rash every day for signs of infection. Check for: More  redness, swelling, or pain. Fluid or blood. Warmth. Pus or a bad smell. Do not scratch your rash. Do not pick at your blisters. To help you not scratch: Keep your fingernails clean and cut short. Try to wear gloves or mittens when you sleep. General instructions Rest. Wash your hands often with soap and water for at least 20 seconds. If you can't use soap and water, use hand sanitizer. Washing your hands lowers your chance of getting a skin infection. Your infection can cause chickenpox in others. If you have  blisters that aren't scabs yet, stay away from: Babies. Pregnant people. Children who have eczema. Older people who have organ transplants. People who have a long-term, or chronic, illness. Anyone who hasn't had chickenpox before. Anyone who hasn't gotten the chickenpox vaccine. How is this prevented? Vaccines are the best way to prevent you from getting chickenpox or shingles. Talk with your provider about getting these shots. Where to find more information Centers for Disease Control and Prevention (CDC): [employer]erProm[employer].no Contact a health care provider if: Your pain doesn't get better with medicine. Your pain doesn't get better after the rash heals. You have any signs of infection around the rash. Your rash or blisters get worse. You have a fever or chills. Get help right away if: The rash is on your face or nose. You have pain in your face or by your eye. You lose feeling on one side of your face. You have trouble seeing. You have ear pain or ringing in your ear. This information is not intended to replace advice given to you by your health care provider. Make sure you discuss any questions you have with your health care provider. Document Revised: 07/27/2023 Document Reviewed: 12/09/2022 Elsevier Patient Education  2024 [employer].

## 2024-07-03 ENCOUNTER — Encounter: Payer: Self-pay | Admitting: Dermatology

## 2024-07-03 ENCOUNTER — Ambulatory Visit (INDEPENDENT_AMBULATORY_CARE_PROVIDER_SITE_OTHER): Admitting: Dermatology

## 2024-07-03 VITALS — BP 130/73 | HR 86

## 2024-07-03 DIAGNOSIS — C44729 Squamous cell carcinoma of skin of left lower limb, including hip: Secondary | ICD-10-CM | POA: Diagnosis not present

## 2024-07-03 DIAGNOSIS — D0462 Carcinoma in situ of skin of left upper limb, including shoulder: Secondary | ICD-10-CM | POA: Diagnosis not present

## 2024-07-03 DIAGNOSIS — B079 Viral wart, unspecified: Secondary | ICD-10-CM

## 2024-07-03 DIAGNOSIS — D492 Neoplasm of unspecified behavior of bone, soft tissue, and skin: Secondary | ICD-10-CM

## 2024-07-03 DIAGNOSIS — C44722 Squamous cell carcinoma of skin of right lower limb, including hip: Secondary | ICD-10-CM | POA: Diagnosis not present

## 2024-07-03 DIAGNOSIS — L578 Other skin changes due to chronic exposure to nonionizing radiation: Secondary | ICD-10-CM

## 2024-07-03 DIAGNOSIS — D239 Other benign neoplasm of skin, unspecified: Secondary | ICD-10-CM | POA: Diagnosis not present

## 2024-07-03 DIAGNOSIS — L720 Epidermal cyst: Secondary | ICD-10-CM | POA: Diagnosis not present

## 2024-07-03 DIAGNOSIS — Z85828 Personal history of other malignant neoplasm of skin: Secondary | ICD-10-CM

## 2024-07-03 DIAGNOSIS — D0471 Carcinoma in situ of skin of right lower limb, including hip: Secondary | ICD-10-CM

## 2024-07-03 DIAGNOSIS — D485 Neoplasm of uncertain behavior of skin: Secondary | ICD-10-CM

## 2024-07-03 DIAGNOSIS — L821 Other seborrheic keratosis: Secondary | ICD-10-CM

## 2024-07-03 DIAGNOSIS — C44311 Basal cell carcinoma of skin of nose: Secondary | ICD-10-CM | POA: Diagnosis not present

## 2024-07-03 DIAGNOSIS — L570 Actinic keratosis: Secondary | ICD-10-CM

## 2024-07-03 NOTE — Patient Instructions (Signed)

## 2024-07-03 NOTE — Progress Notes (Unsigned)
 New Patient Visit   Subjective  Juan [name] is a 89 y.o. male accompanied by daughter Denese) who presents for the following: Spots Check  Patient states  has spots located at the scattered that he  would like to have examined. Patient reports the areas have been there for several years. He reports the areas are not bothersome. Patient reports he  has not previously been treated for these areas. Patient denies Hx of bx. Patient denies family history of skin cancer(s).  The patient has spots, moles and lesions to be evaluated, some may be new or changing and the patient may have concern these could be cancer.   The following portions of the chart were reviewed this encounter and updated as appropriate: medications, allergies, medical history  Review of Systems:  No other skin or systemic complaints except as noted in HPI or Assessment and Plan.  Objective  Well appearing patient in no apparent distress; mood and affect are within normal limits.  A full examination was performed including scalp, head, eyes, ears, nose, lips, neck, chest, axillae, abdomen, back, buttocks, bilateral upper extremities, bilateral lower extremities, hands, feet, fingers, toes, fingernails, and toenails. All findings within normal limits unless otherwise noted below.   Relevant exam findings are noted in the Assessment and Plan.                     Located right neck: 5.5 cm cystic nodule Refer to [facility] Right Ala Nasi 8 mm ulcerated lesion Left Lower Leg - Anterior 2 cm x 1 cm pink indurated plaque Right Lower Leg Superior - Anterior 1.5 cm pink crusted papule Right Lower Leg Inferior - Anterior 1 cm pink crusted papule Left Elbow - Posterior 2cm Pink papule with central horn  Assessment & Plan   1. Suspected Basal Cell Carcinoma, Right Ala of Nose - Assessment: 9 mm ulcerated lesion on the right ala of the nose, clinically consistent with basal cell carcinoma. Patient has a  history of previous skin cancers, including one in the same area that has healed. Given the location and size of the lesion, as well as the patient's age and history, a biopsy is warranted to confirm the diagnosis. - Plan:    Perform shave biopsy of the 9 mm lesion on the right ala of nose    Cauterize the base post-biopsy to prevent bleeding    Send specimen to pathology for analysis    Discuss results with patient once available    If confirmed as basal cell carcinoma, refer to Mohs surgeon for definitive treatment  2. Epidermal Inclusion Cyst, Right Neck - Assessment: 5.5 cm cystic nodule located on the right neck, present for approximately 10 years. Clinical examination reveals a punctum, consistent with an epidermal inclusion cyst. The lesion is non-tender on palpation. - Plan:    Refer to Dr. Lowery for surgical excision    Informed patient that procedure will involve local anesthesia, excision, and suturing    Specimen to be sent to lab for analysis post-excision  3. Multiple Suspicious Skin Lesions on Legs and Arms - Assessment: Multiple suspicious lesions noted on bilateral legs and arms. Left anterior leg shows 2 cm x 1 cm pink plaque. Right anterior leg has 1.5 cm pink-crescent papule superiorly and 1 cm pink-crescent papule inferiorly. Left elbow has 2 cm pink papule with central horn, suspicious for wart. These lesions require further evaluation to rule out squamous cell carcinoma or other skin malignancies. - Plan:  Perform shave biopsies of suspicious lesions on legs    Cryotherapy for the lesion on the left elbow    Send biopsy specimens to pathology for analysis    Consider 5-fluorouracil injections for confirmed skin cancers on legs, if appropriate    Educate patient on the importance of daily moisturizing for dry skin on legs    Encourage continued leg elevation at home  4. Multiple Benign-Appearing Lesions - Assessment: Multiple benign-appearing lesions noted,  including seborrheic keratoses. These lesions are consistent with normal age-related skin changes and do not require immediate intervention. - Plan:    Cryotherapy for multiple benign-appearing lesions    Reassure patient about benign nature of seborrheic keratoses  Follow-up in 3 months for comprehensive skin check and to discuss biopsy results.  NEOPLASM OF UNCERTAIN BEHAVIOR OF SKIN (5) Right Ala Nasi Epidermal / dermal shaving  Lesion diameter (cm):  0.8 Informed consent: discussed and consent obtained   Timeout: patient name, date of birth, surgical site, and procedure verified   Procedure prep:  Patient was prepped and draped in usual sterile fashion Prep type:  Isopropyl alcohol Anesthesia: the lesion was anesthetized in a standard fashion   Anesthetic:  1% lidocaine w/ epinephrine 1-100,000 buffered w/ 8.4% NaHCO3 Instrument used: DermaBlade   Hemostasis achieved with: pressure, aluminum chloride and electrodesiccation   Outcome: patient tolerated procedure well   Post-procedure details: sterile dressing applied and wound care instructions given   Dressing type: bandage and petrolatum    Specimen A - Surgical pathology Differential Diagnosis: R/O BCC  Check Margins: No Left Lower Leg - Anterior Epidermal / dermal shaving  Lesion diameter (cm):  2 Informed consent: discussed and consent obtained   Timeout: patient name, date of birth, surgical site, and procedure verified   Procedure prep:  Patient was prepped and draped in usual sterile fashion Prep type:  Isopropyl alcohol Anesthesia: the lesion was anesthetized in a standard fashion   Anesthetic:  1% lidocaine w/ epinephrine 1-100,000 buffered w/ 8.4% NaHCO3 Instrument used: DermaBlade   Hemostasis achieved with: pressure, aluminum chloride and electrodesiccation   Outcome: patient tolerated procedure well   Post-procedure details: sterile dressing applied and wound care instructions given   Dressing type: bandage  and petrolatum    Specimen B - Surgical pathology Differential Diagnosis: R/O SCC vs Other  Check Margins: No Right Lower Leg Superior - Anterior Epidermal / dermal shaving  Lesion diameter (cm):  1.5 Informed consent: discussed and consent obtained   Timeout: patient name, date of birth, surgical site, and procedure verified   Procedure prep:  Patient was prepped and draped in usual sterile fashion Prep type:  Isopropyl alcohol Anesthesia: the lesion was anesthetized in a standard fashion   Anesthetic:  1% lidocaine w/ epinephrine 1-100,000 buffered w/ 8.4% NaHCO3 Instrument used: DermaBlade   Hemostasis achieved with: pressure, aluminum chloride and electrodesiccation   Outcome: patient tolerated procedure well   Post-procedure details: sterile dressing applied and wound care instructions given   Dressing type: bandage and petrolatum    Specimen C - Surgical pathology Differential Diagnosis: R/O SCC  Check Margins: No Right Lower Leg Inferior - Anterior Epidermal / dermal shaving  Lesion diameter (cm):  1 Informed consent: discussed and consent obtained   Timeout: patient name, date of birth, surgical site, and procedure verified   Procedure prep:  Patient was prepped and draped in usual sterile fashion Prep type:  Isopropyl alcohol Anesthesia: the lesion was anesthetized in a standard fashion   Anesthetic:  1% lidocaine w/ epinephrine 1-100,000 buffered w/ 8.4% NaHCO3 Instrument used: DermaBlade   Hemostasis achieved with: pressure, aluminum chloride and electrodesiccation   Outcome: patient tolerated procedure well   Post-procedure details: sterile dressing applied and wound care instructions given   Dressing type: bandage and petrolatum    Specimen D - Surgical pathology Differential Diagnosis: R/O SCC  Check Margins: No Left Elbow - Posterior Epidermal / dermal shaving  Lesion diameter (cm):  2 Informed consent: discussed and consent obtained   Timeout:  patient name, date of birth, surgical site, and procedure verified   Procedure prep:  Patient was prepped and draped in usual sterile fashion Prep type:  Isopropyl alcohol Anesthesia: the lesion was anesthetized in a standard fashion   Anesthetic:  1% lidocaine w/ epinephrine 1-100,000 buffered w/ 8.4% NaHCO3 Instrument used: DermaBlade   Hemostasis achieved with: pressure, aluminum chloride and electrodesiccation   Outcome: patient tolerated procedure well   Post-procedure details: sterile dressing applied and wound care instructions given   Dressing type: bandage and petrolatum    Specimen E - Surgical pathology Differential Diagnosis: R/O SCC  Check Margins: No AK (ACTINIC KERATOSIS) (7) Left Forearm - Posterior, Left Hand - Posterior, Left Lower Leg - Anterior, Right Forearm - Posterior, Right Hand - Posterior, Right Lower Leg - Anterior, Right Temple Destruction of lesion - Left Forearm - Posterior, Left Hand - Posterior, Left Lower Leg - Anterior, Right Forearm - Posterior, Right Hand - Posterior, Right Lower Leg - Anterior, Right Temple Complexity: simple   Destruction method: cryotherapy   Informed consent: discussed and consent obtained   Timeout:  patient name, date of birth, surgical site, and procedure verified Lesion destroyed using liquid nitrogen: Yes   Cryotherapy cycles:  20 Post-procedure details: wound care instructions given    VIRAL WARTS, UNSPECIFIED TYPE Right Upper Arm - Posterior Destruction of lesion - Right Upper Arm - Posterior Complexity: simple   Destruction method: cryotherapy   Informed consent: discussed and consent obtained   Timeout:  patient name, date of birth, surgical site, and procedure verified Lesion destroyed using liquid nitrogen: Yes   Post-procedure details: wound care instructions given     Return in about 3 months (around 10/01/2024) for TBSE.  I, Jetta Ager, am acting as Neurosurgeon for [employer], DO.  Documentation: I have  reviewed the above documentation for accuracy and completeness, and I agree with the above.  Delon Lenis, DO

## 2024-07-04 LAB — SURGICAL PATHOLOGY

## 2024-07-09 ENCOUNTER — Ambulatory Visit: Payer: Self-pay | Admitting: Dermatology

## 2024-07-09 ENCOUNTER — Encounter: Payer: Self-pay | Admitting: Dermatology

## 2024-07-09 DIAGNOSIS — C4491 Basal cell carcinoma of skin, unspecified: Secondary | ICD-10-CM | POA: Insufficient documentation

## 2024-07-09 DIAGNOSIS — D099 Carcinoma in situ, unspecified: Secondary | ICD-10-CM | POA: Insufficient documentation

## 2024-07-09 DIAGNOSIS — L720 Epidermal cyst: Secondary | ICD-10-CM

## 2024-07-09 NOTE — Progress Notes (Signed)
 Please call pt and notify their bx results were positive for a skin CA that will be excised by Dr. Corey via mohs for the lesion on his nose.  And at his follow up with me, I will treat the other lesions with injections of intralesional chemotherapy (5-fu)  Diagnosis 1. Skin , right ala nasi --> mohs w/ Dr Corey BASAL CELL CARCINOMA WITH FOCAL SCLEROSIS, ULCERATED, SEE DESCRIPTION  2. 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 Skin , right lower leg superior - anterior SQUAMOUS CELL CARCINOMA IN SITU, HYPERTROPHIC  4. Skin , right lower leg inferior - anterior  --> IL-5FU WELL DIFFERENTIATED SQUAMOUS CELL CARCINOMA, SEE DESCRIPTION  5. Skin , left elbow - posterior  -> 5/fu SQUAMOUS CELL CARCINOMA IN SITU, HYPERTROPHIC, BASE INVOLVED

## 2024-07-10 NOTE — Addendum Note (Signed)
 Addended by: Shermaine Brigham U on: 07/10/2024 01:11 PM   Modules accepted: Orders

## 2024-07-11 ENCOUNTER — Telehealth: Payer: Self-pay | Admitting: Plastic Surgery

## 2024-07-11 NOTE — Telephone Encounter (Signed)
 error

## 2024-07-17 ENCOUNTER — Ambulatory Visit: Admitting: Plastic Surgery

## 2024-07-17 ENCOUNTER — Encounter: Payer: Self-pay | Admitting: Plastic Surgery

## 2024-07-17 VITALS — BP 142/86 | HR 84 | Ht 71.0 in | Wt 220.2 lb

## 2024-07-17 DIAGNOSIS — R221 Localized swelling, mass and lump, neck: Secondary | ICD-10-CM | POA: Diagnosis not present

## 2024-07-17 DIAGNOSIS — L723 Sebaceous cyst: Secondary | ICD-10-CM | POA: Insufficient documentation

## 2024-07-17 NOTE — Progress Notes (Signed)
 Patient ID: Juan [name], male    DOB: [birth year], 89 y.o.   MRN: [id number]   Chief Complaint  Patient presents with   Consult         The patient is a 89 year old male here with family for evaluation of his right neck area.  He has a history of cerebrovascular accident with acute encephalopathy.  He has had basal cell carcinoma of the skin in the past.  He is also had squamous cell carcinoma.  He has a 4 cm mass on the right neck area.  It is been there for years.  It seems to be getting bigger.  It is movable and feels like a sebaceous cyst.  No lymphadenopathy noted and no other areas of concern brought to my attention.    Review of Systems  Constitutional: Negative.   HENT: Negative.    Eyes: Negative.   Respiratory: Negative.    Cardiovascular: Negative.   Gastrointestinal: Negative.   Endocrine: Negative.   Genitourinary: Negative.     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   left lower leg - anterior - IL-5FU needed right lower leg superior - anterior right lower leg inferior - anterior - IL-5FU needed left elbow - posterior - 5/fu topical needed     Stroke (HCC) 2024    Past Surgical History:  Procedure Laterality Date   APPENDECTOMY        Current Outpatient Medications:    apixaban  (ELIQUIS ) 5 MG TABS tablet, Take 1 tablet (5 mg total) by mouth 2 (two) times daily., Disp: 180 tablet, Rfl: 1   Ascorbic Acid (VITAMIN C  WITH ROSE HIPS) 500 MG tablet, Take 1 tablet (500 mg total) by mouth daily., Disp: , Rfl:    Cyanocobalamin (B-12 PO), Take by mouth., Disp: , Rfl:    rosuvastatin  (CRESTOR ) 20 MG tablet, Take 1 tablet (20 mg total) by mouth daily., Disp: 90 tablet, Rfl: 1   Objective:    Vitals:   07/17/24 1326  BP: (!) 142/86  Pulse: 84  SpO2: 96%    Physical Exam Vitals reviewed.  Constitutional:      Appearance: Normal appearance.  HENT:     Head: Atraumatic.   Cardiovascular:     Rate and Rhythm: Normal rate.     Pulses: Normal pulses.  Pulmonary:     Effort: Pulmonary effort is normal.  Abdominal:     Palpations: Abdomen is soft.  Skin:    General: Skin is warm.     Capillary Refill: Capillary refill takes less than 2 seconds.     Findings: Lesion present.  Neurological:     Mental Status: He is alert and oriented to person, place, and time.  Psychiatric:        Mood and Affect: Mood normal.        Behavior: Behavior normal.        Thought Content: Thought content normal.        Judgment: Judgment normal.     Assessment & Plan:  Sebaceous cyst  I think is reasonable to take this out in the office.  I will send a note to his PCP to  see if he can come off the Eliquis  or if he needs to be bridged with Lovenox .  The plan will be removal of right neck mass.  Pictures were obtained of the patient and placed in the chart with the patient's or guardian's permission.   Estefana RAMAN Carigan Lister, DO

## 2024-07-18 ENCOUNTER — Telehealth: Payer: Self-pay

## 2024-07-18 NOTE — Telephone Encounter (Signed)
 Left message on voicemail for patient to return call when available to schedule a video or in person visit with Medina-Vargas, Monina C, NP

## 2024-07-18 NOTE — Telephone Encounter (Signed)
-----   Message from [place] Medina-Vargas sent at 07/18/2024 12:28 PM EDT ----- Pls call patient to schedule a visit. I will have to talk to him regarding risks of stopping Eliquis  for the surgery. Thanks. Monina ----- Message ----- From: Tish Elsie FALCON, MD Sent: 07/18/2024  11:37 AM EDT To: Jereld JAYSON Serum, NP  Please see UptoDate info I texted you ----- Message ----- From: Serum Jereld JAYSON, NP Sent: 07/17/2024   2:42 PM EDT To: Elsie FALCON Tish, MD  Hello Hopp,  has history of CVA and PAF, currently on Eliquis   GFR 49, do you think I should hold it for 2 days and restart a day after the surgery without bridging with Lovenox , CVA 01/2023?  Thanks.  Monina ----- Message ----- From: Lowery Estefana RAMAN, DO Sent: 07/17/2024   1:48 PM EDT To: Monina C Medina-Vargas, NP  Hello I hope you are doing well.  I saw this patient today.  He would like to have a cyst removed from his neck.  I think we can do that here in the office without any trouble.  Can he come off the Eliquis  for 2 days prior to the procedure or does he need to be bridged with Lovenox .  Your help in this would be most appreciated.

## 2024-07-18 NOTE — Telephone Encounter (Signed)
 LANE BAUSERMAN (daughter)  called back and is asking if she can do the appointment without the patient because the has POA and would be easier if she can do a virtual visit while at work.   Callback: [phone]

## 2024-07-19 NOTE — Telephone Encounter (Signed)
 Virtual visit is fine as long as patient is in there as well.

## 2024-07-19 NOTE — Telephone Encounter (Signed)
 Daughter called again this morning and was informed that patient would have to be present for video visit and she then asked why is a visit necessary?  Why can't Monina just advise about the eliquis , we have a lot going on and it's a lot to schedule a visit.   Please advise

## 2024-07-19 NOTE — Telephone Encounter (Signed)
 The daughter declined video and in person visit. The question that she has is WHY is an appointment being requested as she feels that this is something that the provider can address without an appointment

## 2024-07-22 NOTE — Telephone Encounter (Signed)
 Hello,  Stopping eliquis  puts him at risk for 15.26 % at risk for CVA. My first concern is no harm to Mr. Juan [name]. Neck lesion is benign.  If insurance is covering then good.  Usually insurance won't cover removal unless infection present.  I would just like to let you know about these things.  Let me know what your response is and will proceed with your decision.  Thanks.  Raman Featherston

## 2024-07-25 ENCOUNTER — Telehealth: Payer: Self-pay | Admitting: Plastic Surgery

## 2024-07-25 NOTE — Telephone Encounter (Signed)
 Daughter wants to know if this procedure will be cover under ins, there no route for the procedure, it was just scheduled. PX is 08-01-24, is there a way to get auth before then, thank you

## 2024-07-29 ENCOUNTER — Telehealth: Payer: Self-pay | Admitting: Plastic Surgery

## 2024-07-29 ENCOUNTER — Ambulatory Visit: Admitting: Plastic Surgery

## 2024-07-29 NOTE — Telephone Encounter (Signed)
 Patients daughter called inquiring about her fathers procedure, she wanted to know if it's being covered under INS, if not she wants to reschedule. She stated that they would want to know cost prior to [facility], if it needs to be out of pocket. She also wanted to know if there was an alternative rather than him stopping his Eliquis .

## 2024-07-30 ENCOUNTER — Encounter: Payer: Self-pay | Admitting: Dermatology

## 2024-08-01 ENCOUNTER — Ambulatory Visit: Admitting: Plastic Surgery

## 2024-08-05 ENCOUNTER — Ambulatory Visit (INDEPENDENT_AMBULATORY_CARE_PROVIDER_SITE_OTHER): Admitting: Dermatology

## 2024-08-05 ENCOUNTER — Encounter: Payer: Self-pay | Admitting: Dermatology

## 2024-08-05 VITALS — BP 140/88 | HR 78 | Temp 98.8°F

## 2024-08-05 DIAGNOSIS — L814 Other melanin hyperpigmentation: Secondary | ICD-10-CM

## 2024-08-05 DIAGNOSIS — C44311 Basal cell carcinoma of skin of nose: Secondary | ICD-10-CM | POA: Diagnosis not present

## 2024-08-05 DIAGNOSIS — L578 Other skin changes due to chronic exposure to nonionizing radiation: Secondary | ICD-10-CM

## 2024-08-05 DIAGNOSIS — C4491 Basal cell carcinoma of skin, unspecified: Secondary | ICD-10-CM

## 2024-08-05 NOTE — Progress Notes (Signed)
 Follow-Up Visit   Subjective  Juan [name] is a 89 y.o. male who presents for the following: Mohs of Basal Cell Carcinoma on the right ala nasi, referred by Dr. Alm. He is accompanied by his daughter.   The following portions of the chart were reviewed this encounter and updated as appropriate: medications, allergies, medical history  Review of Systems:  No other skin or systemic complaints except as noted in HPI or Assessment and Plan.  Objective  Well appearing patient in no apparent distress; mood and affect are within normal limits.  A focused examination was performed of the following areas: Right ala nasi Relevant physical exam findings are noted in the Assessment and Plan.   Right Ala Nasi Pink pearly papule or plaque with arborizing vessels.    Assessment & Plan   BASAL CELL CARCINOMA (BCC), UNSPECIFIED SITE Right Ala Nasi Mohs surgery  Consent obtained: written  Anticoagulation: Is the patient taking prescription anticoagulant and/or aspirin  prescribed/recommended by a physician? Yes   Was the anticoagulation regimen changed prior to Mohs? No    Anesthesia: Anesthesia method: local infiltration Local anesthetic: lidocaine 1% WITH epi  Procedure Details: Timeout: pre-procedure verification complete Procedure Prep: patient was prepped and draped in usual sterile fashion Prep type: chlorhexidine Biopsy accession number: [id number] Biopsy lab: [facility] Frozen section biopsy performed: No   Pre-Op diagnosis: basal cell carcinoma BCC subtype: sclerosing MohsAIQ Surgical site (if tumor spans multiple areas, please select predominant area): nose Surgery side: right Surgical site (from skin exam): Right Ala Nasi Pre-operative length (cm): 0.6 Pre-operative width (cm): 0.5 Indications for Mohs surgery: anatomic location where tissue conservation is critical Previously treated? No    Micrographic Surgery Details: Post-operative length (cm):  1.5 Post-operative width (cm): 1.3 Number of Mohs stages: 3 Cumulative additional sections past 5 per stage: 0 Post surgery depth of defect: cartilage Is this a complex case (associate members only): No    Stage 1    Tumor features identified on Mohs section: basal carcinoma    Depth of tumor invasion after stage: cartilage    Perineural invasion: no perineural invasion  Stage 2    Tumor features identified on Mohs section: basal carcinoma    Depth of tumor invasion after stage: cartilage    Perineural invasion: no perineural invasion  Stage 3    Tumor features identified on Mohs section: no tumor identified    Depth of tumor invasion after stage: cartilage    Perineural invasion: no perineural invasion  Patient tolerance of procedure: tolerated well, no immediate complications  Reconstruction: Was the defect reconstructed? Yes   Was reconstruction performed by the same Mohs surgeon? Yes   Setting of reconstruction: outpatient office When was reconstruction performed? same day Type of reconstruction: flap Type of flap: transposition   Transposition flap type: bilobed  Opioids: Did the patient receive a prescription for opioid/narcotic related to Mohs surgery? Yes   Indications for opioid/narcotics: patient required additional pain relief despite trial of non-opioid analgesia  Antibiotics: Does patient meet AHA guidelines for endocarditis?: No   Does patient meet AHA guidelines for orthopedic prophylaxis?: No   Were antibiotics given on the day of surgery?: No   Did surgery breach mucosa, expose cartilage/bone, involve an area of lymphedema/inflamed/infected tissue? Yes    Skin repair Complexity:  Intermediate Final length (cm):  0.6 Informed consent: discussed and consent obtained   Timeout: patient name, date of birth, surgical site, and procedure verified   Procedure prep:  Patient was prepped  and draped in usual sterile fashion Prep type:   Chlorhexidine Anesthesia: the lesion was anesthetized in a standard fashion   Anesthetic:  1% lidocaine w/ epinephrine 1-100,000 buffered w/ 8.4% NaHCO3 Reason for type of repair: reduce tension to allow closure, preserve normal anatomy, preserve normal anatomical and functional relationships, avoid adjacent structures and allow side-to-side closure without requiring a flap or graft   Undermining: area extensively undermined   Subcutaneous layers (deep stitches):  Suture size:  5-0 Suture type: Vicryl (polyglactin 910)   Stitches:  Buried vertical mattress Fine/surface layer approximation (top stitches):  Suture size:  5-0 Suture type: Prolene (polypropylene) and fast-absorbing plain gut   Stitches: simple running   Suture removal (days):  10 Hemostasis achieved with: suture, pressure and electrodesiccation Outcome: patient tolerated procedure well with no complications   Post-procedure details: sterile dressing applied and wound care instructions given   Dressing type: bandage, petrolatum and pressure dressing      Return in about 10 days (around 08/15/2024).  LILLETTE Berwyn Lesches, Surg Tech III, am acting as scribe for RUFUS CHRISTELLA HOLY, MD.    08/05/2024  HISTORY OF PRESENT ILLNESS  Juan [name] is seen in consultation at the request of Dr. Alm for biopsy-proven Sclerotic Basal cell Carcinoma on the right nasal ala. They note that the area has been present for about 6 months increasing in size with time.  There is no history of previous treatment.  Reports no other new or changing lesions and has no other complaints today.  Medications and allergies: see patient chart.  Review of systems: Reviewed 8 systems and notable for the above skin cancer.  All other systems reviewed are unremarkable/negative, unless noted in the HPI. Past medical history, surgical history, family history, social history were also reviewed and are noted in the chart/questionnaire.    PHYSICAL  EXAMINATION  General: Well-appearing, in no acute distress, alert and oriented x 4. Vitals reviewed in chart (if available).   Skin: Exam reveals a 0.6 x 0.5 cm erythematous papule and biopsy scar on the right nasal ala. There are rhytids, telangiectasias, and lentigines, consistent with photodamage.  Biopsy report(s) reviewed, confirming the diagnosis.   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 Due to location, size, histology, or recurrence and the likelihood of subclinical extension as well as the need to conserve normal surrounding tissue, the patient was deemed acceptable for Mohs micrographic surgery (MMS).  The nature and purpose of the procedure, associated benefits and risks including recurrence and scarring, possible complications such as pain, infection, and bleeding, and alternative methods of treatment if appropriate were discussed with the patient during consent. The lesion location was verified by the patient, by reviewing previous notes, pathology reports, and by photographs as well as angulation measurements if available.  Informed consent was reviewed and signed by the patient, and timeout was performed at 8:30 AM. See op note below.  2. For the photodamage and solar lentigines, sun protection discussed/information given on OTC sunscreens, and we recommend continued regular follow-up with primary dermatologist every 6 months or sooner for any growing, bleeding, or changing lesions. 3. Prognosis and future surveillance discussed. 4. Letter with treatment outcome sent to referring provider. 5. Pain acetaminophen   MOHS MICROGRAPHIC SURGERY AND RECONSTRUCTION  Initial size:   0.6 x 0.5 cm Surgical defect/wound size: 1.5 x 1.3 cm Anesthesia:    0.33% lidocaine with 1:200,000 epinephrine EBL:    <  5 mL Complications:  None Repair type:   Adjacent Tissue Transfer (Bilobed Transposition Flap) and  Intermediate for Nasal Lining SQ suture:   5-0 Vicryl Cutaneous suture:  5-0 Polyprolene, 5-0 Fast  Final size of the repair: 4.0 x 2.8 = 11.2 cm^2  Stages: 3  STAGE I: Anesthesia achieved with 0.5% lidocaine with 1:200,000 epinephrine. ChloraPrep applied. 1 section(s) excised using Mohs technique (this includes total peripheral and deep tissue margin excision and evaluation with frozen sections, excised and interpreted by the same physician). The tumor was first debulked and then excised with an approx. 2 mm margin.  Hemostasis was achieved with electrocautery as needed.  The specimen was then oriented, subdivided/relaxed, inked, and processed using Mohs technique.    Frozen section analysis revealed a positive margin for thin cords or strands of basaloid tumor cells that deeply infiltrate the surrounding stroma, often with irregular, angulated edges, appearing as a permeating invasion pattern at the tumor margins; these strands are embedded within a dense, collagenous stroma, with less prominent peripheral palisading and retraction in the deep and peripheral margin.    STAGE II: An additional 2 mm margin was excised.  Hemostasis was achieved with electrocautery as needed.  The specimen was then oriented, subdivided/relaxed, inked, and processed using Mohs technique.   Frozen section analysis revealed a positive margin for thin cords or strands of basaloid tumor cells that deeply infiltrate the surrounding stroma, often with irregular, angulated edges, appearing as a permeating invasion pattern at the tumor margins; these strands are embedded within a dense, collagenous stroma, with less prominent peripheral palisading and retraction in the deep margin.  STAGE III: An additional 2 mm margin was excised.  Hemostasis was achieved with electrocautery as needed.  The specimen was then oriented, subdivided/relaxed, inked, and processed using Mohs technique. Evaluation of slides by the Mohs surgeon  revealed clear tumor margins.   Reconstruction  Due to the complexity of the defect, including through and through to the nasal cavity, an intermediate closure and rhombic transposition flap were utilized to reconstruct the defect.   Intermediate Closure- Nasal Cavity Lining  The surgical wound was then cleaned, prepped, and re-anesthetized as above. Wound edges were undermined minimally along at least one entire edge and at a distance equal to or greater than the width of the defect (see wound defect size above) in order to achieve closure and decrease wound tension and anatomic distortion. Redundant tissue repair including standing cone removal was performed. Hemostasis was achieved with electrocautery. Subcutaneous and epidermal tissues were approximated with the above sutures. The surgical site was then lightly scrubbed with sterile, saline-soaked gauze. Steri-strips were applied, and the area was then bandaged using Vaseline ointment, non-adherent gauze, gauze pads, and tape to provide an adequate pressure dressing. The patient tolerated the procedure well, was given detailed written and verbal wound care instructions, and was discharged in good condition.   Options for management of the wound were discussed. Given the location and size/depth of the wound, Primary repair planned.  Burow's triangles planned to follow relaxed skin tension lines.  Anesthesia achieved, chloraprep applied and a sterile drape placed.  Burow's triangles were excised.  Undermining carried out minimally in a subcutaneous plane to facilitate tension-free closure.  Layered closure completed using 5-0 vicryl for buried vertical mattress sutures followed by 5.0 fast absorbing gut in a running cuticular technique.  PROCEDURE: Bilobed Flap  The wound was covered with petrolatum and a dressing.  The patient understands the need to return immediately for  any signs of infection to include swelling, pain, purulent discharge,  localized warmth, or fever.  Contact information was provided to the patient (including after-hours pager numbers)   The wound was reconstructed with a bilobe transposition flap.  Local anesthesia was achieved with the anesthetic indicated above.  The operative site was prepped with a surgical antiseptic solution, and then draped with sterile towels to insure a sterile field.  Beveled wound edges were then excised to 90 degrees relative to the surrounding skin plane.  The flap was cut and elevated, and surrounding skin was undermined in all directions.  Meticulous hemostasis was obtained with an electrosurgical device.  The tertiary defect was first closed to relieve flap tension.  The primary lobule of the flap was then transposed into the Mohs defect, cut precisely to fit the wound, and a standing cone defect was excised.  The secondary lobule was transposed into the remaining secondary defect and sutured into place.  The wound was repaired in a layered fashion to close potential dead space and to precisely and securely approximate the wound edges.  The dimensions of the flap were:  4.0 cm x 2.8 cm for a total flap surface area of 11.2 centimeters squared (cm2).    The wound was covered with petrolatum and a dressing.  The patient understands the need to return immediately for any signs of infection to include swelling, pain, purulent discharge, localized warmth, or fever.  Contact information was provided to the patient (including after-hours pager numbers).  Documentation: I have reviewed the above documentation for accuracy and completeness, and I agree with the above.  RUFUS CHRISTELLA HOLY, MD

## 2024-08-05 NOTE — Patient Instructions (Signed)

## 2024-08-06 MED ORDER — OXYCODONE HCL 5 MG PO TABS: 5.0000 mg | ORAL_TABLET | Freq: Four times a day (QID) | ORAL | 0 refills | Status: DC | PRN

## 2024-08-06 NOTE — Addendum Note (Signed)
 Addended by: COREY RUFUS HERO on: 08/06/2024 12:56 PM   Modules accepted: Orders

## 2024-08-08 ENCOUNTER — Encounter: Payer: Self-pay | Admitting: Dermatology

## 2024-08-14 ENCOUNTER — Ambulatory Visit: Admitting: Dermatology

## 2024-08-14 ENCOUNTER — Encounter: Payer: Self-pay | Admitting: Dermatology

## 2024-08-14 VITALS — BP 128/64 | HR 104

## 2024-08-14 DIAGNOSIS — Z48817 Encounter for surgical aftercare following surgery on the skin and subcutaneous tissue: Secondary | ICD-10-CM

## 2024-08-14 DIAGNOSIS — Z85828 Personal history of other malignant neoplasm of skin: Secondary | ICD-10-CM

## 2024-08-14 DIAGNOSIS — T1490XD Injury, unspecified, subsequent encounter: Secondary | ICD-10-CM

## 2024-08-14 DIAGNOSIS — C4491 Basal cell carcinoma of skin, unspecified: Secondary | ICD-10-CM

## 2024-08-14 NOTE — Progress Notes (Signed)
   Follow Up Visit   Subjective  Juan [name] is a 89 y.o. male who presents for the following: follow up from Mohs surgery   The patient presents for follow up from Mohs surgery for a BCC on the right nasal ala, treated on 08/05/2024, repaired with a bilobe flap. The patient has been allowing the shower water to run over the incision, but does not wash the area. He is applying vaseline over the incision daily.  The endorse the following concerns: none. Denies pain. Patient reports that there has been some oozing at the distal end of the incision.   The following portions of the chart were reviewed this encounter and updated as appropriate: medications, allergies, medical history  Review of Systems:  No other skin or systemic complaints except as noted in HPI or Assessment and Plan.  Objective  Well appearing patient in no apparent distress; mood and affect are within normal limits.  A focal examination was performed including the nose. All findings within normal limits unless otherwise noted below.  Healing wound with mild erythema  Relevant physical exam findings are noted in the Assessment and Plan.       Assessment & Plan   Healing Wound s/p Mohs for [facility] on the right nasal ala, treated on 9/29/025, repaired with a Bilobe flap - Reassured that wound is healing well - Suture removed today - No evidence of infection - No swelling, induration, purulence, dehiscence, or tenderness out of proportion to the clinical exam, see photo above - Discussed that scars take up to 12 months to mature from the date of surgery - Ok to continue ointment daily to wound under a bandage for another 10 days  HISTORY OF BASAL CELL CARCINOMA OF THE SKIN - No evidence of recurrence today - Recommend regular full body skin exams - Recommend daily broad spectrum sunscreen SPF 30+ to sun-exposed areas, reapply every 2 hours as needed.  - Call if any new or changing lesions are noted between office  visits    Return in about 10 days (around 08/24/2024) for Wound Check.  LILLETTE Rollene Gobble, RN, am acting as scribe for RUFUS CHRISTELLA HOLY, MD .   Documentation: I have reviewed the above documentation for accuracy and completeness, and I agree with the above.  RUFUS CHRISTELLA HOLY, MD

## 2024-08-14 NOTE — Patient Instructions (Signed)

## 2024-08-16 ENCOUNTER — Other Ambulatory Visit: Payer: Self-pay | Admitting: Plastic Surgery

## 2024-08-16 DIAGNOSIS — L723 Sebaceous cyst: Secondary | ICD-10-CM

## 2024-08-26 ENCOUNTER — Encounter: Payer: Self-pay | Admitting: Dermatology

## 2024-08-26 ENCOUNTER — Ambulatory Visit (INDEPENDENT_AMBULATORY_CARE_PROVIDER_SITE_OTHER): Admitting: Dermatology

## 2024-08-26 VITALS — BP 129/73 | HR 103

## 2024-08-26 DIAGNOSIS — T1490XD Injury, unspecified, subsequent encounter: Secondary | ICD-10-CM

## 2024-08-26 DIAGNOSIS — C4491 Basal cell carcinoma of skin, unspecified: Secondary | ICD-10-CM

## 2024-08-26 NOTE — Patient Instructions (Signed)

## 2024-08-26 NOTE — Progress Notes (Signed)
   Follow Up Visit   Subjective  Juan [name] is a 89 y.o. male who presents for the following: follow up from Mohs surgery   The patient presents for follow up from Mohs surgery for a BCC on the right nasal ala, treated on 08/05/24, repaired with bilobe flap. The patient has been bandaging the wound as directed. The endorse the following concerns: healing well. He is bandaging the wound with Aquaphor daily under a bandage.   The following portions of the chart were reviewed this encounter and updated as appropriate: medications, allergies, medical history  Review of Systems:  No other skin or systemic complaints except as noted in HPI or Assessment and Plan.  Objective  Well appearing patient in no apparent distress; mood and affect are within normal limits.  A focal examination was performed including scalp, head, and face. All findings within normal limits unless otherwise noted below.  Healing wound with mild erythema  Relevant physical exam findings are noted in the Assessment and Plan.    Assessment & Plan   Healing Wound s/p Mohs for [facility] on the right nasal ala, treated on 08/05/24, repaired with bilobe flap - Reassured that wound is healing well - No evidence of infection - No swelling, induration, purulence, dehiscence, or tenderness out of proportion to the clinical exam, see photo above - Discussed that scars take up to 12 months to mature from the date of surgery - Recommend SPF 30+ to scar daily to prevent purple color from UV exposure during scar maturation process - Discussed that erythema and raised appearance of scar will fade over the next 4-6 months - OK to start scar massage at 4-6 weeks post-op - Can consider silicone based products for scar healing starting at 6 weeks post-op - Ok to continue ointment daily to wound under a bandage for another 1-2 weeks or until completely healed   HISTORY OF BASAL CELL CARCINOMA OF THE SKIN - No evidence of recurrence  today - Recommend regular full body skin exams - Recommend daily broad spectrum sunscreen SPF 30+ to sun-exposed areas, reapply every 2 hours as needed.  - Call if any new or changing lesions are noted between office visits    Return in about 3 weeks (around 09/16/2024) for wound check.  I, Berwyn Lesches, Surg Tech III, am acting as scribe for RUFUS CHRISTELLA HOLY, MD.   Documentation: I have reviewed the above documentation for accuracy and completeness, and I agree with the above.  RUFUS CHRISTELLA HOLY, MD

## 2024-09-05 ENCOUNTER — Ambulatory Visit: Admitting: Adult Health

## 2024-09-05 ENCOUNTER — Encounter: Payer: Self-pay | Admitting: Adult Health

## 2024-09-05 ENCOUNTER — Ambulatory Visit: Admitting: Podiatry

## 2024-09-05 ENCOUNTER — Encounter: Payer: Self-pay | Admitting: Plastic Surgery

## 2024-09-05 VITALS — BP 124/68 | HR 79 | Temp 97.5°F | Resp 18 | Ht 71.0 in | Wt 230.4 lb

## 2024-09-05 DIAGNOSIS — L723 Sebaceous cyst: Secondary | ICD-10-CM

## 2024-09-05 DIAGNOSIS — C4491 Basal cell carcinoma of skin, unspecified: Secondary | ICD-10-CM | POA: Diagnosis not present

## 2024-09-05 DIAGNOSIS — Z23 Encounter for immunization: Secondary | ICD-10-CM

## 2024-09-05 DIAGNOSIS — E785 Hyperlipidemia, unspecified: Secondary | ICD-10-CM

## 2024-09-05 DIAGNOSIS — I480 Paroxysmal atrial fibrillation: Secondary | ICD-10-CM

## 2024-09-05 DIAGNOSIS — Z8673 Personal history of transient ischemic attack (TIA), and cerebral infarction without residual deficits: Secondary | ICD-10-CM

## 2024-09-05 NOTE — Progress Notes (Signed)
 [facility] clinic  Provider:  Jereld Serum DNP  Code Status:  Full Code  Goals of Care:     02/20/2023    1:08 PM  Advanced Directives  Does Patient Have a Medical Advance Directive? Yes  Type of Estate Agent of [place];Living will  Does patient want to make changes to medical advance directive? No - Patient declined  Copy of Healthcare Power of Attorney in Chart? No - copy requested     Chief Complaint  Patient presents with   Medical Management of Chronic Issues    3 months     Discussed the use of AI scribe software for clinical note transcription with the patient, who gave verbal consent to proceed.  HPI: Patient is a 89 y.o. male seen today for a 57-month follow up of chronic medical issues. He is accompanied by his daughter.  He has a sebaceous cyst on his right neck that has been present for many years and has grown in size over the past few years, particularly around 2017-2019. It is not currently causing any pain or discomfort. There are concerns about wound care and the need to stop anticoagulation therapy (Eliquis ) for potential surgical removal.  He underwent Mohs surgery for basal cell carcinoma on his right nose on August 05, 2024, which was repaired with a bilobed flap. The wound is healing well, though there is some residual redness. He has had no significant pain and did not require oxycodone post-surgery. He experiences some nasal swelling post-surgery but no cough or congestion.  He has a history of a cerebrovascular accident (CVA) and atrial fibrillation, for which he takes Eliquis  5 mg twice daily and rosuvastatin  20 mg daily. No recent weakness or palpitations. He does not see a cardiologist but is followed by a neurologist.  He had shingles on his left abdomen in August 2025, with no subsequent flare-ups or pain.  His weight has increased from 220 lbs in September to 230 lbs currently. He maintains a good appetite and exercises  using a treadmill indoors when the weather is unfavorable. He uses a cane for balance, although he has not experienced any falls. He is scheduled for further dermatological evaluations and treatments for potential cancerous lesions on his legs.    Past Medical History:  Diagnosis Date   Arrhythmia    BCC (basal cell carcinoma of skin) 07/09/2024   right ala nasi - tx 08/05/2024 Mohs Dr. 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   Ascorbic Acid (VITAMIN C  WITH ROSE HIPS) 500 MG tablet Take 1 tablet (500 mg total) by mouth daily.   Cyanocobalamin (B-12 PO) Take by mouth.   rosuvastatin  (CRESTOR ) 20 MG tablet Take 1 tablet (20 mg total) by mouth daily.   oxyCODONE (OXY IR/ROXICODONE) 5 MG immediate release tablet Take 1  tablet (5 mg total) by mouth every 6 (six) hours as needed for up to 8 doses. (Patient not taking: Reported on 09/05/2024)   No facility-administered encounter medications on file as of 09/05/2024.    Review of Systems:  Review of Systems  Constitutional:  Negative for activity change, appetite change and fever.  HENT:  Negative for sore throat.   Eyes: Negative.   Cardiovascular:  Negative for chest pain and leg swelling.  Gastrointestinal:  Negative for abdominal distention, diarrhea and vomiting.  Genitourinary:  Negative for dysuria, frequency and urgency.  Skin:  Negative for color change.   Neurological:  Negative for dizziness and headaches.  Psychiatric/Behavioral:  Negative for behavioral problems and sleep disturbance. The patient is not nervous/anxious.     Health Maintenance  Topic Date Due   COVID-19 Vaccine (1) Never done   DTaP/Tdap/Td (1 - Tdap) Never done   Zoster Vaccines- Shingrix (1 of 2) Never done   Medicare Annual Wellness (AWV)  12/25/2019   Pneumococcal Vaccine: 50+ Years  Completed   Influenza Vaccine  Completed   Meningococcal B Vaccine  Aged Out    Physical Exam: Vitals:   09/05/24 1350  BP: 124/68  Pulse: 79  Resp: 18  Temp: (!) 97.5 F (36.4 C)  SpO2: 92%  Weight: 230 lb 6.4 oz (104.5 kg)  Height: 5' 11 (1.803 m)   Body mass index is 32.13 kg/m. Physical Exam Constitutional:      General: He is not in acute distress.    Appearance: He is obese.  HENT:     Head: Normocephalic and atraumatic.     Mouth/Throat:     Mouth: Mucous membranes are moist.  Eyes:     Conjunctiva/sclera: Conjunctivae normal.  Cardiovascular:     Rate and Rhythm: Normal rate. Rhythm irregular.     Pulses: Normal pulses.     Heart sounds: Normal heart sounds.  Pulmonary:     Effort: Pulmonary effort is normal.     Breath sounds: Normal breath sounds.  Abdominal:     General: Bowel sounds are normal.     Palpations: Abdomen is soft.  Musculoskeletal:        General: No swelling. Normal range of motion.     Cervical back: Normal range of motion.     Comments: Right neck sebaceous cyst  Skin:    General: Skin is warm and dry.     Comments: Right neck sebaceous cyst 4 X 4 cm  Neurological:     Mental Status: He is alert.  Psychiatric:        Mood and Affect: Mood normal.        Behavior: Behavior normal.     Labs reviewed: Basic Metabolic Panel: Recent Labs    05/17/24 1040  NA 139  K 4.6  CL 105  CO2 24  GLUCOSE 90  BUN 21  CREATININE 1.33*  CALCIUM  8.9   Liver Function Tests: Recent Labs    05/17/24 1040  AST 17  ALT 16   BILITOT 0.6  PROT 6.6   No results for input(s): LIPASE, AMYLASE in the last 8760 hours. No results for input(s): AMMONIA in the last 8760 hours. CBC: Recent Labs    05/17/24 1040  WBC 9.1  NEUTROABS 6,106  HGB 13.4  HCT 40.4  MCV 105.2*  PLT 272   Lipid Panel: Recent Labs    05/17/24 1040  CHOL 131  HDL 62  LDLCALC 55  TRIG 59  CHOLHDL 2.1  Lab Results  Component Value Date   HGBA1C 6.3 (H) 05/17/2024    Procedures since last visit: No results found.  Assessment/Plan  1. Sebaceous cyst (Primary) - Cyst increasing in size, asymptomatic. Surgical removal complicated by anticoagulation management. Decision to delay surgery until after other treatments. - Delay decision on surgical removal of sebaceous cyst until after completion of other dermatological treatments. - Discuss with cardiologist regarding management of anticoagulation if surgery is pursued.  2. History of CVA (cerebrovascular accident) -  no current neurological deficits. Continues on Eliquis  for prevention.  3. Basal cell carcinoma (BCC), unspecified site -  post-Mohs surgery, 08/05/24, healing Post-Mohs surgery site healing well, residual redness, no complications.  4. PAF (paroxysmal atrial fibrillation) (HCC) -  managed with Eliquis , asymptomatic. No rate-controlling medications. Cardiologist referral planned. - Refer to cardiologist for evaluation and management of atrial fibrillation. - Ambulatory referral to Cardiology  5. Hyperlipidemia, unspecified hyperlipidemia type Lab Results  Component Value Date   CHOL 131 05/17/2024   HDL 62 05/17/2024   LDLCALC 55 05/17/2024   TRIG 59 05/17/2024   CHOLHDL 2.1 05/17/2024    -  Well-controlled with rosuvastatin , lipid panel at goal. - Continue rosuvastatin  20 mg daily.  6. Immunization due - Flu vaccine HIGH DOSE PF(Fluzone Trivalent)     General Health Maintenance Immunizations up to date except flu, tetanus, shingles, and  COVID-19. Weight gain noted, exercise encouraged. - Administer flu vaccine. - Administer tetanus vaccine. - Encourage regular exercise, aiming for at least 150 minutes per week.      Labs/tests ordered:   None   Return in about 3 months (around 12/06/2024).  Chike Farrington Medina-Vargas, NP

## 2024-09-11 ENCOUNTER — Ambulatory Visit: Admitting: Dermatology

## 2024-09-11 ENCOUNTER — Encounter: Payer: Self-pay | Admitting: Dermatology

## 2024-09-11 ENCOUNTER — Ambulatory Visit (INDEPENDENT_AMBULATORY_CARE_PROVIDER_SITE_OTHER): Admitting: Dermatology

## 2024-09-11 DIAGNOSIS — D229 Melanocytic nevi, unspecified: Secondary | ICD-10-CM

## 2024-09-11 DIAGNOSIS — C44622 Squamous cell carcinoma of skin of right upper limb, including shoulder: Secondary | ICD-10-CM | POA: Diagnosis not present

## 2024-09-11 DIAGNOSIS — D045 Carcinoma in situ of skin of trunk: Secondary | ICD-10-CM | POA: Diagnosis not present

## 2024-09-11 DIAGNOSIS — D099 Carcinoma in situ, unspecified: Secondary | ICD-10-CM

## 2024-09-11 DIAGNOSIS — D1801 Hemangioma of skin and subcutaneous tissue: Secondary | ICD-10-CM

## 2024-09-11 DIAGNOSIS — T1490XD Injury, unspecified, subsequent encounter: Secondary | ICD-10-CM

## 2024-09-11 DIAGNOSIS — Z85828 Personal history of other malignant neoplasm of skin: Secondary | ICD-10-CM

## 2024-09-11 DIAGNOSIS — D489 Neoplasm of uncertain behavior, unspecified: Secondary | ICD-10-CM

## 2024-09-11 DIAGNOSIS — L905 Scar conditions and fibrosis of skin: Secondary | ICD-10-CM

## 2024-09-11 DIAGNOSIS — C4491 Basal cell carcinoma of skin, unspecified: Secondary | ICD-10-CM

## 2024-09-11 DIAGNOSIS — Z1283 Encounter for screening for malignant neoplasm of skin: Secondary | ICD-10-CM

## 2024-09-11 DIAGNOSIS — L821 Other seborrheic keratosis: Secondary | ICD-10-CM

## 2024-09-11 DIAGNOSIS — L570 Actinic keratosis: Secondary | ICD-10-CM

## 2024-09-11 DIAGNOSIS — L578 Other skin changes due to chronic exposure to nonionizing radiation: Secondary | ICD-10-CM

## 2024-09-11 DIAGNOSIS — L720 Epidermal cyst: Secondary | ICD-10-CM | POA: Diagnosis not present

## 2024-09-11 DIAGNOSIS — L814 Other melanin hyperpigmentation: Secondary | ICD-10-CM

## 2024-09-11 NOTE — Patient Instructions (Signed)

## 2024-09-11 NOTE — Progress Notes (Unsigned)
   Follow Up Visit   Subjective  Juan [name] is a 89 y.o. male who presents for the following: follow up from Mohs surgery   The patient presents for follow up from Mohs surgery for a BCC on the right nasal ala, treated on 08/05/24, repaired with bilobe flap. The patient has been bandaging the wound as directed. The endorse the following concerns: none  The following portions of the chart were reviewed this encounter and updated as appropriate: medications, allergies, medical history  Review of Systems:  No other skin or systemic complaints except as noted in HPI or Assessment and Plan.  Objective  Well appearing patient in no apparent distress; mood and affect are within normal limits.  A focal examination was performed including scalp, head, face. All findings within normal limits unless otherwise noted below.  Healing wound with mild erythema  Relevant physical exam findings are noted in the Assessment and Plan.    Assessment & Plan   Healing Wound s/p Mohs for [facility] on the right nasal ala, treated on 08/05/24, repaired with bilobe flap - Reassured that wound is healing well - No evidence of infection - No swelling, induration, purulence, dehiscence, or tenderness out of proportion to the clinical exam, see photo above - Discussed that scars take up to 12 months to mature from the date of surgery - Recommend SPF 30+ to scar daily to prevent purple color from UV exposure during scar maturation process - Discussed that erythema and raised appearance of scar will fade over the next 4-6 months - OK to start scar massage at 4-6 weeks post-op - Can consider silicone based products for scar healing starting at 6 weeks post-op - Ok to continue ointment daily to wound under a bandage for another week  HISTORY OF BASAL CELL CARCINOMA OF THE SKIN - No evidence of recurrence today - Recommend regular full body skin exams - Recommend daily broad spectrum sunscreen SPF 30+ to sun-exposed  areas, reapply every 2 hours as needed.  - Call if any new or changing lesions are noted between office visits  Return if symptoms worsen or fail to improve.  I, Darice Smock, CMA, am acting as scribe for RUFUS CHRISTELLA HOLY, MD.   Documentation: I have reviewed the above documentation for accuracy and completeness, and I agree with the above.  RUFUS CHRISTELLA HOLY, MD

## 2024-09-11 NOTE — Patient Instructions (Signed)

## 2024-09-11 NOTE — Progress Notes (Signed)
 Total Body Skin Exam (TBSE) Visit   Subjective  Juan [name] is a 89 y.o. male who presents for the following: Skin Cancer Screening and Full Body Skin Exam  Patient presents today for follow up visit for TBSE.  Patient was last evaluated on 09/11/24 .  Patient denies medication changes.  Patient reports he  does not have spots, moles and lesions of concern to be evaluated. Patient reports throughout his lifetime he  has had moderate sun exposure.  Currently, patient reports if he  has moderate sun exposure, he  does apply sunscreen and/or wears protective coverings. Patient reports he  has hx of bx. Hx of BCC  Patient denies  family history of skin cancers. The patient has spots, moles and lesions to be evaluated, some may be new or changing and the patient has concerns that these could be cancer.  The following portions of the chart were reviewed this encounter and updated as appropriate: medications, allergies, medical history  Review of Systems:  No other skin or systemic complaints except as noted in HPI or Assessment and Plan.  Objective  Well appearing patient in no apparent distress; mood and affect are within normal limits.  A full examination was performed including scalp, head, eyes, ears, nose, lips, neck, chest, axillae, abdomen, back, buttocks, bilateral upper extremities, bilateral lower extremities, hands, feet, fingers, toes, fingernails, and toenails. All findings within normal limits unless otherwise noted below.   Relevant physical exam findings are noted in the Assessment and Plan.  Right Upper Arm - Posterior 1.5 cm Crusted papule  Right Forearm - Posterior 6mm crusted papule  Right Abdomen (side) - Upper 1 cm pink plaque Neck - Anterior 5mm papule  Assessment & Plan   LENTIGINES, SEBORRHEIC KERATOSES, HEMANGIOMAS - Benign normal skin lesions - Benign-appearing - Call for any changes  MELANOCYTIC NEVI - Tan-brown and/or pink-flesh-colored  symmetric macules and papules - Benign appearing on exam today - Observation - Call clinic for new or changing moles - Recommend daily use of broad spectrum spf 30+ sunscreen to sun-exposed areas.   ACTINIC DAMAGE - Chronic condition, secondary to cumulative UV/sun exposure - diffuse scaly erythematous macules with underlying dyspigmentation - Recommend daily broad spectrum sunscreen SPF 30+ to sun-exposed areas, reapply every 2 hours as needed.  - Staying in the shade or wearing long sleeves, sun glasses (UVA+UVB protection) and wide brim hats (4-inch brim around the entire circumference of the hat) are also recommended for sun protection.  - Call for new or changing lesions.  Recommended CeraVE Zinc Shampoo and Conditioner for dry scalp in winter Recommended Eucerin Advanced Repair Lotion to use daily  SKIN CANCER SCREENING PERFORMED TODAY.   A - 1.5 cm crusted papule on right upper arm r.scc B - 6mm right forearm C - 1cm pink plaque r/o bcc vs scc D - 5mm papule right neck r/o bcc  Freeze sccis lower left left posterior 20 second freeze twice right and left leg 3 on left 2 on right AK (ACTINIC KERATOSIS) (19) Chest - Medial (Center) (2), Left Breast (2), Left Forearm - Posterior (3), Left Hand - Posterior, Neck - Anterior (2), Right Breast (2), Right Forearm - Posterior (4), Right Hand - Posterior (2), Right Upper Arm - Posterior Destruction of lesion - Right Upper Arm - Posterior  Destruction method: cryotherapy   Informed consent: discussed and consent obtained   Timeout:  patient name, date of birth, surgical site, and procedure verified Cryotherapy cycles:  20  NEOPLASM  OF UNCERTAIN BEHAVIOR (4) Right Upper Arm - Posterior Epidermal / dermal shaving  Lesion diameter (cm):  1.5 Informed consent: discussed and consent obtained   Timeout: patient name, date of birth, surgical site, and procedure verified   Procedure prep:  Patient was prepped and draped in usual sterile  fashion Prep type:  Isopropyl alcohol Anesthesia: the lesion was anesthetized in a standard fashion   Anesthetic:  1% lidocaine w/ epinephrine 1-100,000 local infiltration Instrument used: DermaBlade   Hemostasis achieved with: aluminum chloride   Outcome: patient tolerated procedure well   Post-procedure details: wound care instructions given    Specimen A - Surgical pathology Differential Diagnosis: r/o SCC  Check Margins: No Right Forearm - Posterior Epidermal / dermal shaving  Lesion diameter (cm):  0.6 Informed consent: discussed and consent obtained   Timeout: patient name, date of birth, surgical site, and procedure verified   Procedure prep:  Patient was prepped and draped in usual sterile fashion Prep type:  Isopropyl alcohol Anesthesia: the lesion was anesthetized in a standard fashion   Anesthetic:  1% lidocaine plain local infiltration Instrument used: DermaBlade   Hemostasis achieved with: aluminum chloride   Outcome: patient tolerated procedure well   Post-procedure details: wound care instructions given    Specimen B - Surgical pathology Differential Diagnosis: r/o SCC  Check Margins: No Right Abdomen (side) - Upper Epidermal / dermal shaving  Lesion diameter (cm):  1 Informed consent: discussed and consent obtained   Timeout: patient name, date of birth, surgical site, and procedure verified   Procedure prep:  Patient was prepped and draped in usual sterile fashion Prep type:  Isopropyl alcohol Anesthesia: the lesion was anesthetized in a standard fashion   Anesthetic:  1% lidocaine w/ epinephrine 1-100,000 local infiltration Instrument used: DermaBlade   Hemostasis achieved with: aluminum chloride   Outcome: patient tolerated procedure well   Post-procedure details: wound care instructions given    Specimen C - Surgical pathology Differential Diagnosis: r/o BCC vs SCC  Check Margins: No Neck - Anterior Epidermal / dermal shaving  Lesion diameter  (cm):  0.5 Informed consent: discussed and consent obtained   Timeout: patient name, date of birth, surgical site, and procedure verified   Procedure prep:  Patient was prepped and draped in usual sterile fashion Prep type:  Isopropyl alcohol Anesthesia: the lesion was anesthetized in a standard fashion   Anesthetic:  1% lidocaine w/ epinephrine 1-100,000 local infiltration Instrument used: DermaBlade   Hemostasis achieved with: aluminum chloride   Outcome: patient tolerated procedure well   Post-procedure details: wound care instructions given    Specimen D - Surgical pathology Differential Diagnosis: R/o BCC  Check Margins: No SQUAMOUS CELL CARCINOMA IN SITU (SCCIS) (4) Left Lower Leg - Anterior Destruction of lesion  Destruction method: cryotherapy   Informed consent: discussed and consent obtained   Timeout:  patient name, date of birth, surgical site, and procedure verified Cryotherapy cycles:  2 Outcome: patient tolerated procedure well with no complications   Post-procedure details: wound care instructions given   Additional details:  2 seconds per cycle  Right Lower Leg - Anterior Destruction of lesion  Destruction method: cryotherapy   Informed consent: discussed and consent obtained   Timeout:  patient name, date of birth, surgical site, and procedure verified Cryotherapy cycles:  2 Outcome: patient tolerated procedure well with no complications   Post-procedure details: wound care instructions given   Additional details:  2 second per cycle  Right Lower Leg - Anterior Destruction  of lesion  Destruction method: cryotherapy   Timeout:  patient name, date of birth, surgical site, and procedure verified Cryotherapy cycles:  2 Outcome: patient tolerated procedure well with no complications   Post-procedure details: wound care instructions given   Additional details:  2 second cycle  Left Lower Leg - Posterior Destruction of lesion  Destruction method:  cryotherapy   Informed consent: discussed and consent obtained   Timeout:  patient name, date of birth, surgical site, and procedure verified Cryotherapy cycles:  2 Outcome: patient tolerated procedure well with no complications   Post-procedure details: wound care instructions given   Additional details:  2 seconds per cycle  Return in about 4 months (around 01/09/2025) for FBSE F/U.  I, Lyle Cords, as acting as a neurosurgeon for [employer], DO .  Patient (and/or pt guardian) consented to the use of AI-assisted tools for note generation.   Documentation: I have reviewed the above documentation for accuracy and completeness, and I agree with the above.  Delon Lenis, DO

## 2024-09-12 ENCOUNTER — Encounter: Payer: Self-pay | Admitting: Podiatry

## 2024-09-12 ENCOUNTER — Ambulatory Visit (INDEPENDENT_AMBULATORY_CARE_PROVIDER_SITE_OTHER): Admitting: Podiatry

## 2024-09-12 DIAGNOSIS — B351 Tinea unguium: Secondary | ICD-10-CM | POA: Diagnosis not present

## 2024-09-12 DIAGNOSIS — M79671 Pain in right foot: Secondary | ICD-10-CM

## 2024-09-12 DIAGNOSIS — M79672 Pain in left foot: Secondary | ICD-10-CM

## 2024-09-12 LAB — SURGICAL PATHOLOGY

## 2024-09-12 NOTE — Progress Notes (Signed)
 Patient presents for evaluation and treatment of tenderness and some redness around nails feet.  Tenderness around toes with walking and wearing shoes.  Physical exam:  General appearance: Alert, pleasant, and in no acute distress.  Vascular: Pedal pulses: DP 2/4 B/L, PT 2/4 B/L. Moderate edema lower legs bilaterally  Neurologic:  Dermatologic:  Nails thickened, disfigured, discolored 1-5 BL with subungual debris.  Redness and hypertrophic nail folds along nail folds bilaterally but no signs of drainage or infection.  Musculoskeletal:     Diagnosis: 1. Painful onychomycotic nails 1 through 5 bilaterally. 2. Pain toes 1 through 5 bilaterally.  Plan: -Debrided onychomycotic nails 1 through 5 bilaterally.  Sharply debrided nails with nail clipper and reduced with a power bur.  Return 3 months RFC

## 2024-09-20 ENCOUNTER — Encounter: Payer: Self-pay | Admitting: Dermatology

## 2024-09-25 ENCOUNTER — Ambulatory Visit: Payer: Self-pay | Admitting: Dermatology

## 2024-09-25 DIAGNOSIS — C4492 Squamous cell carcinoma of skin, unspecified: Secondary | ICD-10-CM | POA: Insufficient documentation

## 2024-09-25 NOTE — Progress Notes (Signed)
 Hi Shirron,   Please call pt and notify their bx results were positive for a skin CA that will be excised by Dr. Corey         1. Skin, right upper arm - posterior : --> SE w/ Dr SHAUNNA      WELL DIFFERENTIATED SQUAMOUS CELL CARCINOMA        2. Skin, right forearm - posterior :   SE w/ Dr SHAUNNA      WELL DIFFERENTIATED SQUAMOUS CELL CARCINOMA        3. 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 Skin, neck - anterior : benign      EPIDERMOID CYST, INFLAMED AND DISRUPTED

## 2024-09-27 ENCOUNTER — Institutional Professional Consult (permissible substitution): Admitting: Plastic Surgery

## 2024-10-09 ENCOUNTER — Encounter: Payer: Self-pay | Admitting: Adult Health

## 2024-10-09 ENCOUNTER — Encounter: Payer: Self-pay | Admitting: Dermatology

## 2024-10-10 NOTE — Telephone Encounter (Signed)
 Please call pt's daughter and let her know if he already completed the recommended course of treatment (it should be detailed in my last note) then no, he should not need more.  But if he ran out then please send him in another rx.  Thanks

## 2024-10-22 ENCOUNTER — Ambulatory Visit: Admitting: Plastic Surgery

## 2024-11-14 ENCOUNTER — Ambulatory Visit: Admitting: Dermatology

## 2024-11-14 ENCOUNTER — Encounter: Payer: Self-pay | Admitting: Dermatology

## 2024-11-14 VITALS — BP 157/89

## 2024-11-14 DIAGNOSIS — D0472 Carcinoma in situ of skin of left lower limb, including hip: Secondary | ICD-10-CM | POA: Diagnosis not present

## 2024-11-14 DIAGNOSIS — D0471 Carcinoma in situ of skin of right lower limb, including hip: Secondary | ICD-10-CM | POA: Diagnosis not present

## 2024-11-14 DIAGNOSIS — D045 Carcinoma in situ of skin of trunk: Secondary | ICD-10-CM

## 2024-11-14 DIAGNOSIS — C4491 Basal cell carcinoma of skin, unspecified: Secondary | ICD-10-CM

## 2024-11-14 DIAGNOSIS — D099 Carcinoma in situ, unspecified: Secondary | ICD-10-CM

## 2024-11-14 NOTE — Patient Instructions (Addendum)

## 2024-11-14 NOTE — Progress Notes (Signed)
 "  Follow-Up Visit   Subjective  Juan [name] is a 89 y.o. male, accompanied by daughter, established patient who presents for FOLLOW UP on the diagnoses listed below:  Patient was last evaluated on 09/11/24.   Juan [name] is a 89 year old male who presents for follow-up and treatment of skin cancers.  He returns for follow-up and treatment of multiple skin lesions. Biopsies performed in August and November identified several squamous cell carcinomas (SCC) and a cyst. The patient had a lesion on the right upper arm that was treated with cryotherapy. The patient had a lesion on the right forearm that was treated similarly. The patient has a lesion on the right abdomen that today will be treated with electrodessication and curettage [facility]). The patient had a lesion on the anterior neck that was diagnosed as a cyst.   FOr the [facility] on the lower extremities, the patient and his daughter opted for conserative teatment with cryo sx and topical efudex over surgical excisions due to his age and risk of delayed wound healing and possible secondary infections.  These lesions were previously treated with cryosurgery on November, 12th, and then with topical efudex for  eight weeks following. The patient had lesions on the left lower leg, right lower leg superior, and right lower leg inferior that were treated as described above.  He has been using Eucerin for moisturizing but will be trying a new moisturizer with urea to help with dry skin.   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    Bx from 07/03/24 listed below Tx with cryosurgery on 09/11/24 followed by 6-8 weeks of 5FU. Another cycle of cryosurgery needed and will be performed today.        2. Skin, left lower leg - anterior       WELL DIFFERENTIATED SQUAMOUS CELL CARCINOMA WITH SUPERFICIAL INFILTRATION       3. Skin, right lower leg superior - anterior :       SQUAMOUS CELL CARCINOMA IN SITU,  HYPERTROPHIC       4. Skin, right lower leg inferior - anterior : -      WELL DIFFERENTIATED SQUAMOUS CELL CARCINOMA, SEE DESCRIPTION   The following portions of the chart were reviewed this encounter and updated as appropriate: medications, allergies, medical history  Review of Systems:  No other skin or systemic complaints except as noted in HPI or Assessment and Plan.  Objective  Well appearing patient in no apparent distress; mood and affect are within normal limits.   A focused examination was performed of the following areas: R abdomen (side) upper   Relevant exam findings are noted in the Assessment and Plan. Right Abdomen (side) - Upper Pink atrophic scar Left Lower Leg - Anterior   Assessment & Plan   Multiple lesions on the lower limbs and trunk, including well-differentiated squamous cell carcinoma and squamous cell carcinoma in situ. Lesions on the left lower leg anterior, right lower leg superior, and right lower leg inferior were treated with cryosurgery and Efudex cream. The right abdomen lesion was treated with ED&C. The left elbow lesion was clear. Conservative treatment approach due to age and healing considerations.  - Performed cryosurgery on the left lower leg anterior, right lower leg superior, and right lower leg inferior. - Applied Aquaphor to all treated sites. - Scheduled follow-up in two months for a full body skin check. - Referred to Dr. Paci for the right  upper arm and right forearm lesions for excision. SQUAMOUS CELL CARCINOMA IN SITU (SCCIS) (4) Right Abdomen (side) - Upper - Destruction of lesion Complexity: extensive   Destruction method: electrodesiccation and curettage   Informed consent: discussed and consent obtained   Timeout:  patient name, date of birth, surgical site, and procedure verified Procedure prep:  Patient was prepped and draped in usual sterile fashion Prep type:  Isopropyl alcohol Anesthesia: the lesion was anesthetized in a  standard fashion   Anesthetic:  1% lidocaine w/ epinephrine 1-100,000 buffered w/ 8.4% NaHCO3 Curettage performed in three different directions: Yes   Electrodesiccation performed over the curetted area: Yes   Lesion length (cm):  2 Margin per side (cm):  0.3 Final wound size (cm):  2.8 Hemostasis achieved with:  pressure, aluminum chloride and electrodesiccation Outcome: patient tolerated procedure well with no complications   Post-procedure details: sterile dressing applied and wound care instructions given   Dressing type: bandage and petrolatum    Left Lower Leg - Anterior - Destruction of lesion Complexity: simple   Destruction method: cryotherapy   Informed consent: discussed and consent obtained   Timeout:  patient name, date of birth, surgical site, and procedure verified Lesion destroyed using liquid nitrogen: Yes   Region frozen until ice ball extended beyond lesion: Yes   Cryotherapy cycles:  3 Lesion length (cm):  0.8 Margin per side (cm):  3 Final wound size (cm):  1.4 Outcome: patient tolerated procedure well with no complications   Post-procedure details: wound care instructions given    Right Lower Leg - Anterior - Destruction of lesion Complexity: simple   Destruction method: cryotherapy   Informed consent: discussed and consent obtained   Timeout:  patient name, date of birth, surgical site, and procedure verified Lesion destroyed using liquid nitrogen: Yes   Region frozen until ice ball extended beyond lesion: Yes   Lesion length (cm):  0.5 Margin per side (cm):  0.3 Final wound size (cm):  1.1 Outcome: patient tolerated procedure well with no complications   Post-procedure details: wound care instructions given    Right Lower Leg - Anterior - Destruction of lesion Complexity: simple   Destruction method: cryotherapy   Informed consent: discussed and consent obtained   Timeout:  patient name, date of birth, surgical site, and procedure verified Lesion  destroyed using liquid nitrogen: Yes   Region frozen until ice ball extended beyond lesion: Yes   Lesion length (cm):  0.5 Margin per side (cm):  0.3 Final wound size (cm):  1.1 Outcome: patient tolerated procedure well with no complications   Post-procedure details: wound care instructions given    BASAL CELL CARCINOMA (BCC), UNSPECIFIED SITE   Existing Treatments - oxyCODONE  (OXY IR/ROXICODONE ) 5 MG immediate release tablet - Take 1 tablet (5 mg total) by mouth every 6 (six) hours as needed for up to 8 doses.  Return in about 2 months (around 01/12/2025) for TBSE.   Documentation: I have reviewed the above documentation for accuracy and completeness, and I agree with the above.  I, Shirron Maranda, CMA II, am acting as scribe for:  Delon Lenis, DO "

## 2024-11-28 ENCOUNTER — Encounter: Payer: Self-pay | Admitting: Neurology

## 2024-11-28 ENCOUNTER — Ambulatory Visit: Payer: Medicare Other | Admitting: Neurology

## 2024-11-28 VITALS — BP 144/90 | HR 78 | Ht 70.0 in | Wt 228.0 lb

## 2024-11-28 DIAGNOSIS — Z8673 Personal history of transient ischemic attack (TIA), and cerebral infarction without residual deficits: Secondary | ICD-10-CM

## 2024-11-28 DIAGNOSIS — I69320 Aphasia following cerebral infarction: Secondary | ICD-10-CM | POA: Diagnosis not present

## 2024-11-28 DIAGNOSIS — I480 Paroxysmal atrial fibrillation: Secondary | ICD-10-CM | POA: Diagnosis not present

## 2024-11-28 NOTE — Patient Instructions (Signed)
"   I had a long d/w patient and his daughter about his recent cardioembolic stroke, chronic atrial fibrillation and resultant posr stroke aphasia, risk for recurrent stroke/TIAs, personally independently reviewed imaging studies and stroke evaluation results and answered questions.Continue Eliquis  (apixaban ) daily  for secondary stroke prevention and maintain strict control of hypertension with blood pressure goal below 130/90, diabetes with hemoglobin A1c goal below 6.5% and lipids with LDL cholesterol goal below 70 mg/dL. I also advised the patient to eat a healthy diet with plenty of whole grains, cereals, fruits and vegetables, exercise regularly and maintain ideal body weight check screening carotid ultrasound .     Followup in the future with me only as needed or call earlier if necessary. "

## 2024-11-28 NOTE — Progress Notes (Signed)
 " [facility] [address] [place]. [place] [address] [phone]       OFFICE FOLLOW-UP NOTE  Juan [name] Date of Birth:  [birth year] Medical Record Number:  [id number]   HPI: Initial visit 05/01/2023 Juan [name] is a pleasant 89 year old Caucasian male seen today for initial office visit following hospital consultation for stroke in March 2024.  Juan [name] is accompanied by Juan [name] daughter.  History is obtained from them and review of electronic medical records.  I personally reviewed pertinent available imaging films in PACS.  Juan [name] has past medical history of hyperlipidemia, diverticulitis, prostate cancer, chronic atrial fibrillation not on anticoagulation.  Juan [name] presented on 01/27/2023 for evaluation for sudden onset of not acting like himself with garbled speech.  Last known well was unclear.  CT head on admission showed subacute hypodensity in the left cerebellum and encephalomalacia in the right frontal lobe.  CT angiogram of the head and neck showed no large vessel occlusion high-grade stenosis.  MRI scan confirmed acute infarct involving the left MCA  And occipital region as well as an acute left superior cerebellar infarct.  Echocardiogram showed ejection fraction of 55 to 60% with mild concentric hypertrophy as well as left atrium dilatation.  EEG showed mild cortical dysfunction in the left temporal region but no seizures.  LDL cholesterol elevated at 90 mg percent.  Hemoglobin A1c was 5.9.  Juan [name] has history of chronic atrial fibrillation but has not been on anticoagulation.  Juan [name] was started on Eliquis  and Pravachol 40 mg daily.  Juan [name] has finished outpatient speech therapy last week and has obtained substantial improvement.  Juan [name] is able to communicate and speak but has to speak slowly and has occasional word hesitancy.  Juan [name] has not been to live with Juan [name] daughter.  Juan [name] is mostly independent in activities of daily living.  Juan [name] is tolerating Eliquis  well without bruising or bleeding.   Tolerating Pravachol well without muscle aches and pains.  Juan [name] has not had any follow-up lipid profile checked.  Denies any prior history of strokes CT head on admission actually showing encephalomalacia in the right frontal lobe which may have been from a silent stroke.  Juan [name] has no physical deficits from the stroke except mild aphasia which also seems to be improving quite well.  Juan [name] has no complaints today.  Updated 11/30/2023  : Juan [name] returns for follow-up after last visit 6 months ago.  Juan [name] is accompanied by Juan [name] daughter.  Juan [name] continues to do well.  Juan [name] is more recurrent stroke or TIA symptoms.  Juan [name] speech appears to have improved but Juan [name] still has mild expressive language difficulties and some paraphasic errors and word finding difficulties.  Juan [name] is speaking more sentences and is able to communicate quite well.  Juan [name] remains on Eliquis  which is tolerating well without significant bleeding and only minor bruising.  Juan [name] blood pressure tends to run quite good at home but today it is elevated in office at 165/96.  Juan [name] has had no new health issues.  Juan [name] remains on Crestor  which is tolerating well without muscle aches and pains and last lipid profile on 05/01/2023 showed LDL-cholesterol to be optimal at 58 mg percent.  Juan [name] has no complaints today.  Juan [name] is mostly independent in activities of daily living.  Juan [name] can be left alone for several hours but Juan [name] is living with Juan [name] daughter. Update 11/28/2024 : Juan [name] returns for follow-up after last visit a year ago.  Juan [name] is accompanied by Juan [name] daughter.  Juan [name] is continuing to  do well without recurrent TIA or stroke symptoms.  Juan [name] speech is improved with only mild expressive language and word finding difficulties.  Juan [name] has good days and bad days.  Juan [name] has mild short-term memory difficulties but these are stable and nonprogressive.  Juan [name] is living at home with Juan [name] daughter.  Juan [name] is mostly independent in all activities of daily living.  Juan [name] is tolerating Eliquis  well minor bruising and no bleeding.  Juan [name] blood  pressure was slightly elevated when Juan [name] first came at 169/110 6, 140/96 primary care physician has referred him for cardiology appointment Juan [name] is tolerating Crestor  well without side effects.  Last lipid profile on 05/17/2024 showed LDL cholesterol to be optimal at 55 mg percent.  Hemoglobin A1c was borderline at 6.3.  ROS:   14 system review of systems is positive for aphasia, speech difficulties, word finding difficulties, bruising, decreased hearing and all other systems negative  PMH:  Past Medical History:  Diagnosis Date   Arrhythmia    BCC (basal cell carcinoma of skin) 07/09/2024   right ala nasi - tx 08/05/2024 Mohs Dr. Corey   Diverticulitis 2004   Hyperlipidemia    Prostate cancer (HCC)    SCC (squamous cell carcinoma) 09/25/2024   right upper arm - posterior - Ex neededright forearm - posterior - Ex needed    Shingles outbreak    LEFT CHEST WALL   Squamous cell carcinoma in situ (SCCIS) 07/09/2024   left lower leg - anterior - IL-5FU needed right lower leg superior - anterior right lower leg inferior - anterior - IL-5FU needed left elbow - posterior - 5/fu topical needed     Stroke [facility]) 2024    Social History:  Social History   Socioeconomic History   Marital status: Married    Spouse name: Not on file   Number of children: Not on file   Years of education: Not on file   Highest education level: Some college, no degree  Occupational History   Not on file  Tobacco Use   Smoking status: Never    Passive exposure: Never   Smokeless tobacco: Not on file  Substance and Sexual Activity   Alcohol use: Yes    Alcohol/week: 2.0 standard drinks of alcohol    Types: 2 Glasses of wine per week   Drug use: No   Sexual activity: Not Currently  Other Topics Concern   Not on file  Social History Narrative   Pt lives with daughter    Pt retired    Social Drivers of Health   Tobacco Use: Unknown (11/28/2024)   Juan [name] History    Smoking Tobacco Use: Never    Smokeless  Tobacco Use: Unknown    Passive Exposure: Never  Financial Resource Strain: Not on file  Food Insecurity: Juan [name] Unable To Answer (01/27/2023)   Hunger Vital Sign    Worried About Running Out of Food in the Last Year: Juan [name] unable to answer    Ran Out of Food in the Last Year: Juan [name] unable to answer  Transportation Needs: Juan [name] Unable To Answer (01/27/2023)   PRAPARE - Transportation    Lack of Transportation (Medical): Juan [name] unable to answer    Lack of Transportation (Non-Medical): Juan [name] unable to answer  Physical Activity: Not on file  Stress: Not on file  Social Connections: Not on file  Intimate Partner Violence: Juan [name] Unable To Answer (01/27/2023)   Humiliation, Afraid, Rape, and Kick questionnaire    Fear of Current or Ex-Partner: Juan [name] unable to answer  Emotionally Abused: Juan [name] unable to answer    Physically Abused: Juan [name] unable to answer    Sexually Abused: Juan [name] unable to answer  Depression (PHQ2-9): Low Risk (06/06/2024)   Depression (PHQ2-9)    PHQ-2 Score: 0  Alcohol Screen: Not on file  Housing: Low Risk (01/27/2023)   Housing    Last Housing Risk Score: 0  Utilities: Juan [name] Unable To Answer (01/27/2023)   AHC Utilities    Threatened with loss of utilities: Juan [name] unable to answer  Health Literacy: Not on file    Medications:   Current Outpatient Medications on File Prior to Visit  Medication Sig Dispense Refill   apixaban  (ELIQUIS ) 5 MG TABS tablet Take 1 tablet (5 mg total) by mouth 2 (two) times daily. 180 tablet 1   Ascorbic Acid (VITAMIN C  WITH ROSE HIPS) 500 MG tablet Take 1 tablet (500 mg total) by mouth daily.     rosuvastatin  (CRESTOR ) 20 MG tablet Take 1 tablet (20 mg total) by mouth daily. 90 tablet 1   Cyanocobalamin  (B-12 PO) Take by mouth.     oxyCODONE  (OXY IR/ROXICODONE ) 5 MG immediate release tablet Take 1 tablet (5 mg total) by mouth every 6 (six) hours as needed for up to 8 doses. (Juan [name] not taking: Reported on 11/28/2024) 8  tablet 0   No current facility-administered medications on file prior to visit.    Allergies:  No Known Allergies  Physical Exam General: well developed, well nourished pleasant elderly Caucasian male, seated, in no evident distress Head: head normocephalic and atraumatic.  Neck: supple with no carotid or supraclavicular bruits Cardiovascular: regular rate and rhythm, no murmurs Musculoskeletal: no deformity Skin:  no rash/petichiae Vascular:  Normal pulses all extremities Vitals:   11/28/24 1447 11/28/24 1459  BP: (!) 169/101 (!) 144/90  Pulse: 78 78   Neurologic Exam Mental Status: Awake and fully alert. Oriented to place and time. Recent and remote memory diminished. Attention span, concentration and fund of knowledge diminished mood and affect appropriate.  Mild expressive aphasia with occasional word finding difficulties versus intensity.  Slight impairment of repetition and naming.  Good comprehension.  Occasional paraphasic errors. Cranial Nerves: Fundoscopic exam not done Pupils equal, briskly reactive to light. Extraocular movements full without nystagmus. Visual fields full to confrontation. Hearing slightly diminished bilaterally. Facial sensation intact. Face, tongue, palate moves normally and symmetrically.  Motor: Normal bulk and tone. Normal strength in all tested extremity muscles. Sensory.: intact to touch ,pinprick .position and vibratory sensation.  Coordination: Rapid alternating movements normal in all extremities. Finger-to-nose and heel-to-shin performed accurately bilaterally. Gait and Station: Arises from chair without difficulty. Stance is normal. Gait demonstrates normal stride length and balance slightly unsteady when Juan [name] turns.. Able to heel, toe and tandem walk with moderate difficulty.  Reflexes: 1+ and symmetric. Toes downgoing.   NIHSS  1 Modified Rankin  2   ASSESSMENT: 89 year old Caucasian male with left MCA and left superior cerebellar embolic  infarcts in March 2024 due to atrial fibrillation while not on anticoagulation.  Juan [name] is doing remarkably well with only mild residual aphasia.  Vascular risk factors of atrial fibrillation and hyperlipidemia and age only.     PLAN: I had a long d/w Juan [name] and Juan [name] daughter about Juan [name] recent cardioembolic stroke, chronic atrial fibrillation and resultant posr stroke aphasia, risk for recurrent stroke/TIAs, personally independently reviewed imaging studies and stroke evaluation results and answered questions.Continue Eliquis  (apixaban ) daily  for secondary stroke prevention and maintain strict control of hypertension with blood pressure  goal below 130/90, diabetes with hemoglobin A1c goal below 6.5% and lipids with LDL cholesterol goal below 70 mg/dL. I also advised the Juan [name] to eat a healthy diet with plenty of whole grains, cereals, fruits and vegetables, exercise regularly and maintain ideal body weight check screening carotid ultrasound .     Followup in the future with me only as needed or call earlier if necessary..   I personally spent a total of  35 minutes in the care of the Juan [name] today including getting/reviewing separately obtained history, performing a medically appropriate exam/evaluation, counseling and educating, placing orders, referring and communicating with other health care professionals, documenting clinical information in the EHR, independently interpreting results, and coordinating care.        Eather Popp, MD Note: This document was prepared with digital dictation and possible smart phrase technology. Any transcriptional errors that result from this process are unintentional  "

## 2024-12-09 ENCOUNTER — Ambulatory Visit: Admitting: Adult Health

## 2024-12-11 ENCOUNTER — Encounter: Payer: Self-pay | Admitting: Dermatology

## 2024-12-12 ENCOUNTER — Ambulatory Visit: Admitting: Adult Health

## 2024-12-12 ENCOUNTER — Encounter: Payer: Self-pay | Admitting: Adult Health

## 2024-12-12 VITALS — BP 146/86 | HR 84 | Temp 96.8°F | Ht 70.0 in | Wt 227.6 lb

## 2024-12-12 DIAGNOSIS — D099 Carcinoma in situ, unspecified: Secondary | ICD-10-CM

## 2024-12-12 DIAGNOSIS — I480 Paroxysmal atrial fibrillation: Secondary | ICD-10-CM

## 2024-12-12 DIAGNOSIS — H9193 Unspecified hearing loss, bilateral: Secondary | ICD-10-CM

## 2024-12-12 DIAGNOSIS — Z8673 Personal history of transient ischemic attack (TIA), and cerebral infarction without residual deficits: Secondary | ICD-10-CM

## 2024-12-12 DIAGNOSIS — R7303 Prediabetes: Secondary | ICD-10-CM

## 2024-12-12 NOTE — Progress Notes (Unsigned)
 "  [facility]  Provider:  Jereld Serum DNP  Code Status:  Full Code  Goals of Care:     02/20/2023    1:08 PM  Advanced Directives  Does Patient Have a Medical Advance Directive? Yes  Type of Estate Agent of [name];Living will  Does patient want to make changes to medical advance directive? No - Patient declined  Copy of Healthcare Power of Attorney in Chart? No - copy requested     Chief Complaint  Patient presents with   Medical Management of Chronic Issues    3 month follow up     Discussed the use of AI scribe software for clinical note transcription with the patient, who gave verbal consent to proceed.   HPI: Patient is a 89 y.o. male seen today for a a 42-month follow up of chronic medical issues.    Wt Readings from Last 3 Encounters:  12/12/24 227 lb 9.6 oz (103.2 kg)  11/28/24 228 lb (103.4 kg)  09/05/24 230 lb 6.4 oz (104.5 kg)     Past Medical History:  Diagnosis Date   Arrhythmia    BCC (basal cell carcinoma of skin) 07/09/2024   right ala nasi - tx 08/05/2024 Mohs Dr. 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   Ascorbic Acid (VITAMIN C  WITH ROSE HIPS) 500 MG tablet Take 1 tablet (500 mg total) by mouth daily.   Cyanocobalamin   (B-12 PO) Take by mouth.   rosuvastatin  (CRESTOR ) 20 MG tablet Take 1 tablet (20 mg total) by mouth daily.   oxyCODONE  (OXY IR/ROXICODONE ) 5 MG immediate release tablet Take 1 tablet (5 mg total) by mouth every 6 (six) hours as needed for up to 8 doses. (Patient not taking: Reported on 12/12/2024)   No facility-administered encounter medications on file as of 12/12/2024.    Review of Systems:  Review of Systems  Constitutional:  Negative for activity change, appetite change and fever.  HENT:  Positive for hearing loss. Negative for sore throat.   Eyes: Negative.   Cardiovascular:  Negative for chest pain and leg swelling.  Gastrointestinal:  Negative for abdominal distention, diarrhea and vomiting.  Genitourinary:  Negative for dysuria, frequency and urgency.  Skin:  Negative for color change.  Neurological:  Negative for dizziness and headaches.  Psychiatric/Behavioral:  Negative for behavioral problems and sleep disturbance. The patient is not nervous/anxious.     Health Maintenance  Topic Date Due   COVID-19 Vaccine (1) Never done   DTaP/Tdap/Td (1 - Tdap) Never done   Zoster Vaccines- Shingrix (1 of 2) Never done   Medicare Annual Wellness (AWV)  12/25/2019   Pneumococcal Vaccine: 50+ Years  Completed  Influenza Vaccine  Completed   Meningococcal B Vaccine  Aged Out    Physical Exam: Vitals:   12/12/24 1429  BP: (!) 146/86  Pulse: 84  Temp: (!) 96.8 F (36 C)  TempSrc: Temporal  SpO2: 100%  Weight: 227 lb 9.6 oz (103.2 kg)  Height: 5' 10 (1.778 m)   Body mass index is 32.66 kg/m. Physical Exam Constitutional:      Appearance: Normal appearance.  HENT:     Head: Normocephalic and atraumatic.     Mouth/Throat:     Mouth: Mucous membranes are moist.  Eyes:     Conjunctiva/sclera: Conjunctivae normal.  Cardiovascular:     Rate and Rhythm: Normal rate. Rhythm irregular.     Pulses: Normal pulses.     Heart sounds: Normal heart sounds.  Pulmonary:     Effort:  Pulmonary effort is normal.     Breath sounds: Normal breath sounds.  Abdominal:     General: Bowel sounds are normal.     Palpations: Abdomen is soft.  Musculoskeletal:        General: No swelling. Normal range of motion.     Cervical back: Normal range of motion.  Skin:    General: Skin is warm and dry.     Comments: Right neck sebaceous cyst, bilateral lower leg dry skin  Neurological:     General: No focal deficit present.     Mental Status: He is alert and oriented to person, place, and time.  Psychiatric:        Mood and Affect: Mood normal.        Behavior: Behavior normal.        Thought Content: Thought content normal.        Judgment: Judgment normal.     Labs reviewed: Basic Metabolic Panel: Recent Labs    05/17/24 1040  NA 139  K 4.6  CL 105  CO2 24  GLUCOSE 90  BUN 21  CREATININE 1.33*  CALCIUM  8.9   Liver Function Tests: Recent Labs    05/17/24 1040  AST 17  ALT 16  BILITOT 0.6  PROT 6.6   No results for input(s): LIPASE, AMYLASE in the last 8760 hours. No results for input(s): AMMONIA in the last 8760 hours. CBC: Recent Labs    05/17/24 1040  WBC 9.1  NEUTROABS 6,106  HGB 13.4  HCT 40.4  MCV 105.2*  PLT 272   Lipid Panel: Recent Labs    05/17/24 1040  CHOL 131  HDL 62  LDLCALC 55  TRIG 59  CHOLHDL 2.1   Lab Results  Component Value Date   HGBA1C 6.3 (H) 05/17/2024    Procedures since last visit: No results found.  Assessment/Plan     Labs/tests ordered:     Return in about 3 months (around 03/11/2025).  Juan Oaxaca Medina-Vargas, NP    [1] No Known Allergies  "

## 2024-12-13 ENCOUNTER — Ambulatory Visit: Admitting: Podiatry

## 2024-12-13 DIAGNOSIS — B351 Tinea unguium: Secondary | ICD-10-CM

## 2024-12-13 NOTE — Progress Notes (Signed)
 Patient presents for evaluation and treatment of tenderness and some redness around nails feet.  Tenderness around toes with walking and wearing shoes.  Physical exam:  General appearance: Alert, pleasant, and in no acute distress.  Vascular: Pedal pulses: DP 2/4 B/L, PT 2/4 B/L. Moderate edema lower legs bilaterally.  Capillary refill time immediate bilaterally  Neurologic:  Dermatologic:  Nails thickened, disfigured, discolored 1-5 BL with subungual debris.  Redness and hypertrophic nail folds along nail folds bilaterally but no signs of drainage or infection.  Musculoskeletal:     Diagnosis: 1. Painful onychomycotic nails 1 through 5 bilaterally. 2. Pain toes 1 through 5 bilaterally.  Plan: -Debrided onychomycotic nails 1 through 5 bilaterally.  Sharply debrided nails with nail clipper and reduced with a power bur.  Return 3 months RFC

## 2024-12-16 ENCOUNTER — Encounter: Admitting: Dermatology

## 2024-12-30 ENCOUNTER — Encounter: Admitting: Dermatology

## 2025-01-02 ENCOUNTER — Ambulatory Visit: Admitting: Dermatology

## 2025-01-21 ENCOUNTER — Ambulatory Visit: Admitting: Dermatology

## 2025-02-27 ENCOUNTER — Ambulatory Visit: Admitting: Dermatology

## 2025-03-13 ENCOUNTER — Ambulatory Visit: Admitting: Podiatry
